# Patient Record
Sex: Female | Born: 1965 | Race: White | Hispanic: No | Marital: Single | Smoking: Never smoker
Health system: Southern US, Community
[De-identification: ages and names within clinical notes are randomized; demographics above are authoritative.]

## PROBLEM LIST (undated history)

## (undated) DIAGNOSIS — Z9221 Personal history of antineoplastic chemotherapy: Secondary | ICD-10-CM

## (undated) DIAGNOSIS — Z923 Personal history of irradiation: Secondary | ICD-10-CM

## (undated) DIAGNOSIS — C801 Malignant (primary) neoplasm, unspecified: Secondary | ICD-10-CM

## (undated) HISTORY — PX: BREAST BIOPSY: SHX20

## (undated) HISTORY — PX: BREAST LUMPECTOMY: SHX2

## (undated) HISTORY — PX: TUBAL LIGATION: SHX77

---

## 2018-11-11 DIAGNOSIS — M79673 Pain in unspecified foot: Secondary | ICD-10-CM | POA: Diagnosis not present

## 2018-11-11 DIAGNOSIS — R609 Edema, unspecified: Secondary | ICD-10-CM | POA: Diagnosis not present

## 2018-11-17 DIAGNOSIS — N925 Other specified irregular menstruation: Secondary | ICD-10-CM | POA: Diagnosis not present

## 2018-11-17 DIAGNOSIS — R5383 Other fatigue: Secondary | ICD-10-CM | POA: Diagnosis not present

## 2018-11-17 DIAGNOSIS — R600 Localized edema: Secondary | ICD-10-CM | POA: Diagnosis not present

## 2018-11-18 DIAGNOSIS — R740 Nonspecific elevation of levels of transaminase and lactic acid dehydrogenase [LDH]: Secondary | ICD-10-CM | POA: Diagnosis not present

## 2018-12-03 DIAGNOSIS — N925 Other specified irregular menstruation: Secondary | ICD-10-CM | POA: Diagnosis not present

## 2018-12-03 DIAGNOSIS — D259 Leiomyoma of uterus, unspecified: Secondary | ICD-10-CM | POA: Diagnosis not present

## 2018-12-31 DIAGNOSIS — D508 Other iron deficiency anemias: Secondary | ICD-10-CM | POA: Diagnosis not present

## 2018-12-31 DIAGNOSIS — R799 Abnormal finding of blood chemistry, unspecified: Secondary | ICD-10-CM | POA: Diagnosis not present

## 2019-01-10 DIAGNOSIS — N921 Excessive and frequent menstruation with irregular cycle: Secondary | ICD-10-CM | POA: Diagnosis not present

## 2019-01-10 DIAGNOSIS — D259 Leiomyoma of uterus, unspecified: Secondary | ICD-10-CM | POA: Diagnosis not present

## 2019-01-10 DIAGNOSIS — R9389 Abnormal findings on diagnostic imaging of other specified body structures: Secondary | ICD-10-CM | POA: Diagnosis not present

## 2019-01-12 ENCOUNTER — Ambulatory Visit (INDEPENDENT_AMBULATORY_CARE_PROVIDER_SITE_OTHER): Payer: BC Managed Care – PPO

## 2019-01-12 ENCOUNTER — Other Ambulatory Visit: Payer: Self-pay

## 2019-01-12 ENCOUNTER — Telehealth: Payer: Self-pay | Admitting: *Deleted

## 2019-01-12 ENCOUNTER — Ambulatory Visit (INDEPENDENT_AMBULATORY_CARE_PROVIDER_SITE_OTHER): Payer: BC Managed Care – PPO | Admitting: Sports Medicine

## 2019-01-12 ENCOUNTER — Other Ambulatory Visit: Payer: Self-pay | Admitting: Sports Medicine

## 2019-01-12 ENCOUNTER — Encounter: Payer: Self-pay | Admitting: Sports Medicine

## 2019-01-12 DIAGNOSIS — R609 Edema, unspecified: Secondary | ICD-10-CM

## 2019-01-12 DIAGNOSIS — M25572 Pain in left ankle and joints of left foot: Secondary | ICD-10-CM | POA: Diagnosis not present

## 2019-01-12 DIAGNOSIS — R2242 Localized swelling, mass and lump, left lower limb: Secondary | ICD-10-CM

## 2019-01-12 DIAGNOSIS — S96912A Strain of unspecified muscle and tendon at ankle and foot level, left foot, initial encounter: Secondary | ICD-10-CM | POA: Diagnosis not present

## 2019-01-12 NOTE — Telephone Encounter (Signed)
-----   Message from [place], [place] sent at 01/12/2019  2:59 PM EDT ----- Regarding: Venous Duplex R/o Blood Clot Swelling in left leg r/o blood clot. Please get it done this week. Shes on [employer]. Cannon Kettle

## 2019-01-12 NOTE — Progress Notes (Signed)
Subjective:  Elizabeth Sharp is a 53 y.o. female patient who presents to office for evaluation of left foot/ankle pain. 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 Patient denies any other pedal complaints. Denies injury/trip/fall/sprain/any causative factors.   Review of Systems  Cardiovascular: Positive for leg swelling.  Musculoskeletal: Positive for joint pain and myalgias.     There are no active problems to display for this patient.   No current outpatient medications on file prior to visit.   No current facility-administered medications on file prior to visit.     Not on File  Objective:  General: Alert and oriented x3 in no acute distress  Dermatology: No open lesions bilateral lower extremities, no webspace macerations, no ecchymosis bilateral, all nails x 10 are well manicured.  Vascular: Dorsalis Pedis and Posterior Tibial pedal pulses faintly palpable, Capillary Fill Time 3 seconds,(+) pedal hair growth bilateral, +1 Pitting edema to knee on left, Temperature gradient within normal limits.  Neurology: Johney Maine sensation intact via  light touch bilateral, Numbness when in shoe due to swelling  Musculoskeletal: Moderate tenderness with palpation at posterior left ankle and to calf muscle. No pain to foot on left. No pain with calf compression bilateral. Range of motion within normal limits with mild guarding on Left ankle. Negative thompson sign.   Gait: Antalgic gait  Xrays  Left foot/ Ankle   Impression:No acute findings.   Assessment and Plan: Problem List Items Addressed This Visit    None    Visit Diagnoses    Localized swelling of left lower leg    -  Primary   Tendon tear, ankle, left, initial encounter       Joint pain of ankle and foot, left          -Complete examination performed -Xrays reviewed -Discussed treatement options for possible long standing DVT vs Tendon tear -Rx Venous duplex r/o DVT and patient has started on 325mg  ASA as protective factors -Applied unna boot and instructed to keep in place until she can get her Duplex -Dispensed CAM boot and advised patient to use daily with toe guard if needed for work; Advised patient if her job does not allow her to use this boot she will have to be out of work until her tests are done -Advised patient if Duplex is negative will proceed with ordering MRI to r/o tear -Patient to return to office after testing or sooner if condition worsens.  Landis Martins, DPM

## 2019-01-13 ENCOUNTER — Other Ambulatory Visit: Payer: Self-pay | Admitting: Sports Medicine

## 2019-01-13 ENCOUNTER — Telehealth: Payer: Self-pay | Admitting: Sports Medicine

## 2019-01-13 ENCOUNTER — Encounter: Payer: Self-pay | Admitting: Sports Medicine

## 2019-01-13 DIAGNOSIS — M25572 Pain in left ankle and joints of left foot: Secondary | ICD-10-CM | POA: Diagnosis not present

## 2019-01-13 DIAGNOSIS — M7989 Other specified soft tissue disorders: Secondary | ICD-10-CM | POA: Diagnosis not present

## 2019-01-13 DIAGNOSIS — R2242 Localized swelling, mass and lump, left lower limb: Secondary | ICD-10-CM

## 2019-01-13 DIAGNOSIS — R609 Edema, unspecified: Secondary | ICD-10-CM

## 2019-01-13 DIAGNOSIS — S96912A Strain of unspecified muscle and tendon at ankle and foot level, left foot, initial encounter: Secondary | ICD-10-CM

## 2019-01-13 NOTE — Telephone Encounter (Signed)
[facility] Kathrin Ruddy scheduled Venous Doppler [id number] for 01/13/2019 arrive 1:00pm for 1:30pm testing. Faxed orders to [facility].

## 2019-01-13 NOTE — Telephone Encounter (Signed)
Thank you :)

## 2019-01-13 NOTE — Telephone Encounter (Signed)
I informed pt of [facility] 01/13/2019 appt.

## 2019-01-13 NOTE — Telephone Encounter (Signed)
Will you let patient know that her duplex is negative for DVT and proceed with ordering MRI w/w/o contrast to r/o tear at her achilles on the left Thanks Dr. Chauncey Cruel

## 2019-01-14 NOTE — Telephone Encounter (Signed)
Yes w/ and w/o thanks -Dr Chauncey Cruel

## 2019-01-14 NOTE — Telephone Encounter (Signed)
I informed pt of the results of the doppler and that we would be pre-certing the MRI prior to scheduling.

## 2019-01-17 NOTE — Telephone Encounter (Signed)
Orders to J. Quintana, RN for pre-cert. 

## 2019-01-17 NOTE — Addendum Note (Signed)
Addended by: Harriett Sine D on: 01/17/2019 09:26 AM   Modules accepted: Orders

## 2019-01-19 ENCOUNTER — Other Ambulatory Visit: Payer: Self-pay | Admitting: Sports Medicine

## 2019-01-19 ENCOUNTER — Telehealth: Payer: Self-pay | Admitting: *Deleted

## 2019-01-19 DIAGNOSIS — M25572 Pain in left ankle and joints of left foot: Secondary | ICD-10-CM

## 2019-01-19 MED ORDER — TRAMADOL HCL 50 MG PO TABS: 50.0000 mg | ORAL_TABLET | Freq: Three times a day (TID) | ORAL | 0 refills | Status: AC | PRN

## 2019-01-19 NOTE — Telephone Encounter (Signed)
Patient is calling again wanting to know the status of her MRI.  Does not want to hear that it is still in precert.  States it has been a week and she is in pain and needs something done now.  Patient is demanding that we contact the insurance and tell them it must be rushed and she should not have to be in pain this long.  Patient sates that she should at least be on something to help the pain and inflammation.   Ladies please advise as soon as possible.

## 2019-01-19 NOTE — Telephone Encounter (Signed)
I have sent to pharmacy Tramadol for the pain and she can take OTC motrin as needed for any additional pain relief and have recommended her to rest ice and elevate and limit her activities until we can get MRI Please follow up with the patient regarding the status of her MRI. Thanks Dr. Cannon Kettle

## 2019-01-19 NOTE — Progress Notes (Signed)
Sent Tramadol for pain and if needed patient can supplement with OTC motrin for any additional pain and inflammation until her MRI can be done. Patient to continue with rest, ice ,elevation until symptoms improve.

## 2019-01-19 NOTE — Telephone Encounter (Signed)
[facility] Kathrin Ruddy scheduled MRI [id number] left ankle with and without on 01/24/2019 arrive 8:00am for 8:15am imaging. Faxed orders to [facility].

## 2019-01-19 NOTE — Telephone Encounter (Signed)
I called pt and informed of Dr. Leeanne Rio orders, pt states understanding and said she called [facility] and they got her an appt tomorrow. Pt thanked me for giving her their number to call for a cancellation appt slot.

## 2019-01-19 NOTE — Telephone Encounter (Signed)
I informed pt of [facility] appt 01/24/2019 and the [phone] to call for possible cancellation appt slots earlier than 01/24/2019.

## 2019-01-20 ENCOUNTER — Encounter: Payer: Self-pay | Admitting: Sports Medicine

## 2019-01-20 DIAGNOSIS — M19072 Primary osteoarthritis, left ankle and foot: Secondary | ICD-10-CM | POA: Diagnosis not present

## 2019-01-20 DIAGNOSIS — S96912A Strain of unspecified muscle and tendon at ankle and foot level, left foot, initial encounter: Secondary | ICD-10-CM | POA: Diagnosis not present

## 2019-01-20 DIAGNOSIS — M65879 Other synovitis and tenosynovitis, unspecified ankle and foot: Secondary | ICD-10-CM | POA: Diagnosis not present

## 2019-01-21 ENCOUNTER — Telehealth: Payer: Self-pay | Admitting: *Deleted

## 2019-01-21 ENCOUNTER — Telehealth: Payer: Self-pay | Admitting: Sports Medicine

## 2019-01-21 MED ORDER — PREDNISONE 10 MG (21) PO TBPK: ORAL_TABLET | ORAL | 0 refills | Status: DC

## 2019-01-21 NOTE — Telephone Encounter (Signed)
Pt states the prednisone prescription does not have instruction just came in a box. I informed pt the instructions would be on the pill card in the box and to begin on a full day. Pt states understanding.

## 2019-01-21 NOTE — Telephone Encounter (Signed)
Patient called and confirmed she received the message left this morning.  She will pick up the steroid does pack and scheduled a follow up appointment for Next Friday 01/28/2019

## 2019-01-21 NOTE — Telephone Encounter (Signed)
FYI: I Called patient to discuss MRI results.  There was no answer.  I left a voicemail for patient informing her that her MRI showed significant tendinitis and edema.  There was no significant tearing upon further review of her MRI report as well as the images the area of suspected tear was actually the area of and the accessory muscle at this time there is no acute surgical needs recommend patient to have and start taking a steroid Dosepak and will follow up with her in office on next week.  The steroid Dosepak has already been sent to her pharmacy to take as directed. Patient to call office to set up follow up appointment. -Dr. Cannon Kettle

## 2019-01-25 ENCOUNTER — Telehealth: Payer: Self-pay | Admitting: *Deleted

## 2019-01-25 NOTE — Telephone Encounter (Signed)
Pt states she just missed a call from this office.

## 2019-01-28 ENCOUNTER — Other Ambulatory Visit: Payer: Self-pay

## 2019-01-28 ENCOUNTER — Ambulatory Visit (INDEPENDENT_AMBULATORY_CARE_PROVIDER_SITE_OTHER): Payer: BC Managed Care – PPO | Admitting: Sports Medicine

## 2019-01-28 ENCOUNTER — Encounter: Payer: Self-pay | Admitting: Sports Medicine

## 2019-01-28 VITALS — Temp 97.0°F | Resp 16

## 2019-01-28 DIAGNOSIS — M779 Enthesopathy, unspecified: Secondary | ICD-10-CM

## 2019-01-28 DIAGNOSIS — M25572 Pain in left ankle and joints of left foot: Secondary | ICD-10-CM

## 2019-01-28 DIAGNOSIS — R609 Edema, unspecified: Secondary | ICD-10-CM

## 2019-01-28 DIAGNOSIS — I739 Peripheral vascular disease, unspecified: Secondary | ICD-10-CM

## 2019-01-28 NOTE — Progress Notes (Signed)
Subjective:  Elizabeth Sharp is a 53 y.o. female patient who presents to office for evaluation of left foot/ankle pain. Patient reports that there is no pain that she has been doing very well only concern is swelling that is still present in her left foot and lower leg.  Patient reports that she has been wearing boot at work because it feels supported and icing patient has completed steroid by mouth without any issues.  Patient is also here for review of MRI results and further follow-up discussion.   There are no active problems to display for this patient.   Current Outpatient Medications on File Prior to Visit  Medication Sig Dispense Refill  . Iron-FA-B Cmp-C-Biot-Probiotic (FUSION PLUS) CAPS     . predniSONE (STERAPRED UNI-PAK 21 TAB) 10 MG (21) TBPK tablet Take as directed 21 tablet 0   No current facility-administered medications on file prior to visit.     Not on File  Objective:  General: Alert and oriented x3 in no acute distress  Dermatology: No open lesions bilateral lower extremities, no webspace macerations, no ecchymosis bilateral, all nails x 10 are well manicured.  Vascular: Dorsalis Pedis and Posterior Tibial pedal pulses faintly palpable, Capillary Fill Time 3 seconds,(+) pedal hair growth bilateral, +1 Pitting edema to knee on left, Temperature gradient within normal limits.  Neurology: Johney Maine sensation intact via light touch bilateral, Numbness when in shoe due to swelling  Musculoskeletal: No reproducible tenderness to left foot and lower calf upon palpation.  Thompson sign negative.  No acute signs of DVT.  Patient had an ultrasound that also confirmed no DVT.  MRI supportive with tendinitis without tear.  Assessment and Plan: Problem List Items Addressed This Visit    None    Visit Diagnoses    Tendinitis    -  Primary   Joint pain of ankle and foot, left       Swelling       PVD (peripheral vascular disease) (HCC)         -Complete examination  performed -Advised patient that she is doing better may slowly transition after 5 days out of the cam boot to a normal shoe -Applied Unna boot to the level of the knee on the left foot and lower leg advised patient to keep clean dry and intact for 5 days after 5 days may remove and apply compression sleeve as provided at today's visit advised patient that long-term she definitely needs compression stockings and if swelling continues may benefit from seeing a vascular doctor to help keep the edema under control since she has a lot of spider veins -Patient to return to office as needed or sooner if condition worsens.  Landis Martins, DPM

## 2019-02-10 DIAGNOSIS — R2242 Localized swelling, mass and lump, left lower limb: Secondary | ICD-10-CM | POA: Diagnosis not present

## 2019-02-11 DIAGNOSIS — R2242 Localized swelling, mass and lump, left lower limb: Secondary | ICD-10-CM | POA: Insufficient documentation

## 2019-02-18 DIAGNOSIS — I8311 Varicose veins of right lower extremity with inflammation: Secondary | ICD-10-CM | POA: Diagnosis not present

## 2019-02-18 DIAGNOSIS — R2242 Localized swelling, mass and lump, left lower limb: Secondary | ICD-10-CM | POA: Diagnosis not present

## 2019-02-18 DIAGNOSIS — I8312 Varicose veins of left lower extremity with inflammation: Secondary | ICD-10-CM | POA: Diagnosis not present

## 2019-04-11 DIAGNOSIS — N6342 Unspecified lump in left breast, subareolar: Secondary | ICD-10-CM | POA: Diagnosis not present

## 2019-04-12 ENCOUNTER — Other Ambulatory Visit: Payer: Self-pay | Admitting: Medical

## 2019-04-12 DIAGNOSIS — N6320 Unspecified lump in the left breast, unspecified quadrant: Secondary | ICD-10-CM

## 2019-04-21 ENCOUNTER — Ambulatory Visit
Admission: RE | Admit: 2019-04-21 | Discharge: 2019-04-21 | Disposition: A | Discharge status: Home / Self Care | Payer: BC Managed Care – PPO | Source: Ambulatory Visit | Attending: Medical | Admitting: Medical

## 2019-04-21 ENCOUNTER — Other Ambulatory Visit: Payer: Self-pay | Admitting: Medical

## 2019-04-21 ENCOUNTER — Other Ambulatory Visit: Payer: Self-pay

## 2019-04-21 DIAGNOSIS — R921 Mammographic calcification found on diagnostic imaging of breast: Secondary | ICD-10-CM

## 2019-04-21 DIAGNOSIS — N6320 Unspecified lump in the left breast, unspecified quadrant: Secondary | ICD-10-CM

## 2019-04-21 DIAGNOSIS — N6322 Unspecified lump in the left breast, upper inner quadrant: Secondary | ICD-10-CM | POA: Diagnosis not present

## 2019-04-21 DIAGNOSIS — R922 Inconclusive mammogram: Secondary | ICD-10-CM | POA: Diagnosis not present

## 2019-04-21 DIAGNOSIS — N6489 Other specified disorders of breast: Secondary | ICD-10-CM | POA: Diagnosis not present

## 2019-04-28 ENCOUNTER — Ambulatory Visit
Admission: RE | Admit: 2019-04-28 | Discharge: 2019-04-28 | Disposition: A | Discharge status: Home / Self Care | Payer: BC Managed Care – PPO | Source: Ambulatory Visit | Attending: Medical | Admitting: Medical

## 2019-04-28 ENCOUNTER — Other Ambulatory Visit: Payer: Self-pay

## 2019-04-28 ENCOUNTER — Other Ambulatory Visit: Payer: Self-pay | Admitting: Medical

## 2019-04-28 DIAGNOSIS — N6320 Unspecified lump in the left breast, unspecified quadrant: Secondary | ICD-10-CM

## 2019-04-28 DIAGNOSIS — R590 Localized enlarged lymph nodes: Secondary | ICD-10-CM | POA: Diagnosis not present

## 2019-04-28 DIAGNOSIS — R921 Mammographic calcification found on diagnostic imaging of breast: Secondary | ICD-10-CM | POA: Diagnosis not present

## 2019-04-28 DIAGNOSIS — N6011 Diffuse cystic mastopathy of right breast: Secondary | ICD-10-CM | POA: Diagnosis not present

## 2019-04-28 DIAGNOSIS — N6322 Unspecified lump in the left breast, upper inner quadrant: Secondary | ICD-10-CM | POA: Diagnosis not present

## 2019-04-28 DIAGNOSIS — C773 Secondary and unspecified malignant neoplasm of axilla and upper limb lymph nodes: Secondary | ICD-10-CM | POA: Diagnosis not present

## 2019-04-28 DIAGNOSIS — C50212 Malignant neoplasm of upper-inner quadrant of left female breast: Secondary | ICD-10-CM | POA: Diagnosis not present

## 2019-05-09 ENCOUNTER — Telehealth: Payer: Self-pay | Admitting: Hematology and Oncology

## 2019-05-09 ENCOUNTER — Other Ambulatory Visit: Payer: Self-pay | Admitting: General Surgery

## 2019-05-09 DIAGNOSIS — M7989 Other specified soft tissue disorders: Secondary | ICD-10-CM | POA: Diagnosis not present

## 2019-05-09 DIAGNOSIS — C50812 Malignant neoplasm of overlapping sites of left female breast: Secondary | ICD-10-CM | POA: Diagnosis not present

## 2019-05-09 DIAGNOSIS — R110 Nausea: Secondary | ICD-10-CM | POA: Diagnosis not present

## 2019-05-09 NOTE — Progress Notes (Signed)
[facility] CONSULT NOTE  Patient Care Team: Darrol Jump, PA-C as PCP - General (Family Medicine)  CHIEF COMPLAINTS/PURPOSE OF CONSULTATION:  Newly diagnosed breast cancer  HISTORY OF PRESENTING ILLNESS:  Elizabeth Sharp 53 y.o. female is here because of recent diagnosis of invasive ductal carcinoma of the left breast. The patient palpated a left breast lump. Mammogram on 04/21/19 showed a 4.5cm mass at the 11:30 position in the left breast with smaller adjacent solid masses and one abnormal left axillary lymph node. 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 She presents to the clinic today for initial evaluation and discussion of treatment options.   I reviewed her records extensively and collaborated the history with the patient.  SUMMARY OF ONCOLOGIC HISTORY: Oncology History  Malignant neoplasm of upper-inner quadrant of left breast in female, estrogen receptor positive ([place])  04/28/2019 Initial Diagnosis   Palpable left breast lump.  Mammogram 04/21/19 showed a 4.5cm mass at the 11:30 position with smaller adjacent solid masses and one abnormal left axillary lymph node. 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   05/10/2019 Cancer Staging   Staging form: Breast, AJCC 8th Edition - Clinical stage from 05/10/2019: Stage IIB (cT2, cN1, cM0, G3, ER+, PR+, HER2-) - Signed by Nicholas Lose, MD on 05/10/2019     MEDICAL HISTORY:  Left leg swelling with no clear etiology.  Patient is seen for physicians for this.  Extensive work-up has been done. Iron deficiency anemia SURGICAL HISTORY: Tubal ligation SOCIAL HISTORY: Denies any tobacco alcohol or recreational drug use FAMILY HISTORY: Aunt with breast cancer ALLERGIES:  has no allergies on file.  MEDICATIONS:  Current  Outpatient Medications  Medication Sig Dispense Refill  . Iron-FA-B Cmp-C-Biot-Probiotic (FUSION PLUS) CAPS     . predniSONE (STERAPRED UNI-PAK 21 TAB) 10 MG (21) TBPK tablet Take as directed 21 tablet 0   No current facility-administered medications for this visit.     REVIEW OF SYSTEMS:   Constitutional: Denies fevers, chills or abnormal night sweats Eyes: Denies blurriness of vision, double vision or watery eyes Ears, nose, mouth, throat, and face: Denies mucositis or sore throat Respiratory: Denies cough, dyspnea or wheezes Cardiovascular: Denies palpitation, chest discomfort or lower extremity swelling Gastrointestinal:  Denies nausea, heartburn or change in bowel habits Skin: Denies abnormal skin rashes Lymphatics: Denies new lymphadenopathy or easy bruising Neurological:Denies numbness, tingling or new weaknesses Behavioral/Psych: Mood is stable, no new changes  Breast: Palpable left breast lump  All other systems were reviewed with the patient and are negative.  PHYSICAL EXAMINATION: ECOG PERFORMANCE STATUS: 1 - Symptomatic but completely ambulatory  Vitals:   05/10/19 1303  BP: (!) 155/92  Pulse: 90  Resp: 20  Temp: 98 F (36.7 C)  SpO2: 99%   Filed Weights   05/10/19 1303  Weight: 206 lb 3.2 oz (93.5 kg)    GENERAL:alert, no distress and comfortable SKIN: skin color, texture, turgor are normal, no rashes or significant lesions EYES: normal, conjunctiva are pink and non-injected, sclera clear OROPHARYNX:no exudate, no erythema and lips, buccal mucosa, and tongue normal  NECK: supple, thyroid normal size, non-tender, without nodularity LYMPH:  no palpable lymphadenopathy in the cervical, axillary or inguinal LUNGS: clear to auscultation and percussion with normal breathing effort HEART: regular rate & rhythm and no murmurs and  no lower extremity edema ABDOMEN:abdomen soft, non-tender and normal bowel sounds Musculoskeletal:no cyanosis of digits and no clubbing   PSYCH: alert & oriented x 3 with fluent speech NEURO: no focal motor/sensory deficits BREAST: Palpable left breast mass.  No palpable axillary or supraclavicular lymphadenopathy (exam performed in the presence of a chaperone)   LABORATORY DATA:    RADIOGRAPHIC STUDIES: I have personally reviewed the radiological reports and agreed with the findings in the report.  ASSESSMENT AND PLAN:  Malignant neoplasm of upper-inner quadrant of left breast in female, estrogen receptor positive ([place]) 04/28/2019: Palpable left breast lump, mammogram 4.5 cm mass at 11:30 position and adjacent smaller masses, 1 abnormal left axillary lymph node: Biopsy of breast and axilla both positive for grade 3 IDC ER 95%, PR 90%, Ki-67 70%, HER-2 negative T2N1 stage IIb clinical stage  Pathology and radiology counseling: Discussed with the patient, the details of pathology including the type of breast cancer,the clinical staging, the significance of ER, PR and HER-2/neu receptors and the implications for treatment. After reviewing the pathology in detail, we proceeded to discuss the different treatment options between surgery, radiation, chemotherapy, antiestrogen therapies.  Recommendation: 1. Neoadjuvant chemotherapy with Taxotere and Cytoxan every 3 weeks x4 2.  Breast conserving surgery with targeted node dissection 3.  Adjuvant radiation therapy 4.  Followed by adjuvant antiestrogen therapy  Chemotherapy Counseling: I discussed the risks and benefits of chemotherapy including the risks of nausea/ vomiting, risk of infection from low WBC count, fatigue due to chemo or anemia, bruising or bleeding due to low platelets, mouth sores, loss/ change in taste and decreased appetite. Liver and kidney function will be monitored through out chemotherapy as abnormalities in liver and kidney function may be a side effect of treatment.  Neuropathy risk.  Taxotere was also discussed.  Risk of permanent bone marrow dysfunction and  leukemia due to chemo were also discussed.  Return to clinic on 05/20/2019 to start chemotherapy.    All questions were answered. The patient knows to call the clinic with any problems, questions or concerns.   Rulon Eisenmenger, MD, MPH 05/10/2019    I, Molly Dorshimer, am acting as scribe for Nicholas Lose, MD.  I have reviewed the above documentation for accuracy and completeness, and I agree with the above.

## 2019-05-09 NOTE — Telephone Encounter (Signed)
Received a new patient referral from Dr. Barry Dienes for nex dx of breast cancer. Elizabeth Sharp has been cld and scheduled to see Dr. Lindi Adie on 12/1 at 1pm. She's been made aware to arrive 15 minutes early.

## 2019-05-10 ENCOUNTER — Other Ambulatory Visit: Payer: Self-pay | Admitting: *Deleted

## 2019-05-10 ENCOUNTER — Inpatient Hospital Stay: Payer: BC Managed Care – PPO | Attending: Hematology and Oncology | Admitting: Hematology and Oncology

## 2019-05-10 ENCOUNTER — Other Ambulatory Visit: Payer: Self-pay

## 2019-05-10 DIAGNOSIS — Z79899 Other long term (current) drug therapy: Secondary | ICD-10-CM | POA: Insufficient documentation

## 2019-05-10 DIAGNOSIS — Z803 Family history of malignant neoplasm of breast: Secondary | ICD-10-CM | POA: Diagnosis not present

## 2019-05-10 DIAGNOSIS — M2550 Pain in unspecified joint: Secondary | ICD-10-CM | POA: Diagnosis not present

## 2019-05-10 DIAGNOSIS — R7989 Other specified abnormal findings of blood chemistry: Secondary | ICD-10-CM | POA: Diagnosis not present

## 2019-05-10 DIAGNOSIS — Z5111 Encounter for antineoplastic chemotherapy: Secondary | ICD-10-CM | POA: Insufficient documentation

## 2019-05-10 DIAGNOSIS — R222 Localized swelling, mass and lump, trunk: Secondary | ICD-10-CM | POA: Diagnosis not present

## 2019-05-10 DIAGNOSIS — Z170 Estrogen receptor positive status [ER+]: Secondary | ICD-10-CM

## 2019-05-10 DIAGNOSIS — R438 Other disturbances of smell and taste: Secondary | ICD-10-CM | POA: Insufficient documentation

## 2019-05-10 DIAGNOSIS — D6481 Anemia due to antineoplastic chemotherapy: Secondary | ICD-10-CM | POA: Insufficient documentation

## 2019-05-10 DIAGNOSIS — Z5189 Encounter for other specified aftercare: Secondary | ICD-10-CM | POA: Insufficient documentation

## 2019-05-10 DIAGNOSIS — D259 Leiomyoma of uterus, unspecified: Secondary | ICD-10-CM | POA: Insufficient documentation

## 2019-05-10 DIAGNOSIS — L659 Nonscarring hair loss, unspecified: Secondary | ICD-10-CM | POA: Diagnosis not present

## 2019-05-10 DIAGNOSIS — C50212 Malignant neoplasm of upper-inner quadrant of left female breast: Secondary | ICD-10-CM | POA: Insufficient documentation

## 2019-05-10 DIAGNOSIS — E611 Iron deficiency: Secondary | ICD-10-CM | POA: Insufficient documentation

## 2019-05-10 DIAGNOSIS — D509 Iron deficiency anemia, unspecified: Secondary | ICD-10-CM | POA: Diagnosis not present

## 2019-05-10 DIAGNOSIS — M7989 Other specified soft tissue disorders: Secondary | ICD-10-CM | POA: Insufficient documentation

## 2019-05-10 DIAGNOSIS — R5383 Other fatigue: Secondary | ICD-10-CM | POA: Insufficient documentation

## 2019-05-10 DIAGNOSIS — T451X5A Adverse effect of antineoplastic and immunosuppressive drugs, initial encounter: Secondary | ICD-10-CM | POA: Insufficient documentation

## 2019-05-10 NOTE — Assessment & Plan Note (Addendum)
04/28/2019: Palpable left breast lump, mammogram 4.5 cm mass at 11:30 position and adjacent smaller masses, 1 abnormal left axillary lymph node: Biopsy of breast and axilla both positive for grade 3 IDC ER 95%, PR 90%, Ki-67 70%, HER-2 negative T2N1 stage IIb clinical stage  Pathology and radiology counseling: Discussed with the patient, the details of pathology including the type of breast cancer,the clinical staging, the significance of ER, PR and HER-2/neu receptors and the implications for treatment. After reviewing the pathology in detail, we proceeded to discuss the different treatment options between surgery, radiation, chemotherapy, antiestrogen therapies.  Recommendation: 1. Neoadjuvant chemotherapy with Taxotere and Cytoxan every 3 weeks x4 2.  Breast conserving surgery with targeted node dissection 3.  Adjuvant radiation therapy 4.  Followed by adjuvant antiestrogen therapy  Chemotherapy Counseling: I discussed the risks and benefits of chemotherapy including the risks of nausea/ vomiting, risk of infection from low WBC count, fatigue due to chemo or anemia, bruising or bleeding due to low platelets, mouth sores, loss/ change in taste and decreased appetite. Liver and kidney function will be monitored through out chemotherapy as abnormalities in liver and kidney function may be a side effect of treatment.  Neuropathy risk.  Taxotere was also discussed.  Risk of permanent bone marrow dysfunction and leukemia due to chemo were also discussed.  Return to clinic on 05/20/2019 to start chemotherapy.

## 2019-05-11 ENCOUNTER — Telehealth: Payer: Self-pay | Admitting: Hematology and Oncology

## 2019-05-11 ENCOUNTER — Telehealth: Payer: Self-pay | Admitting: *Deleted

## 2019-05-11 NOTE — Telephone Encounter (Signed)
I left a message regarding schedule  

## 2019-05-11 NOTE — Telephone Encounter (Signed)
Spoke to pt gave navigation resources and contact information. Verified future appts. Denies further questions at this time

## 2019-05-12 ENCOUNTER — Other Ambulatory Visit: Payer: Self-pay | Admitting: *Deleted

## 2019-05-12 ENCOUNTER — Other Ambulatory Visit: Payer: Self-pay | Admitting: Hematology and Oncology

## 2019-05-12 DIAGNOSIS — Z170 Estrogen receptor positive status [ER+]: Secondary | ICD-10-CM

## 2019-05-12 DIAGNOSIS — C50212 Malignant neoplasm of upper-inner quadrant of left female breast: Secondary | ICD-10-CM

## 2019-05-12 MED ORDER — ONDANSETRON HCL 8 MG PO TABS: 8.0000 mg | ORAL_TABLET | Freq: Two times a day (BID) | ORAL | 1 refills | Status: DC | PRN

## 2019-05-12 MED ORDER — PROCHLORPERAZINE MALEATE 10 MG PO TABS: 10.0000 mg | ORAL_TABLET | Freq: Four times a day (QID) | ORAL | 1 refills | Status: DC | PRN

## 2019-05-12 MED ORDER — DEXAMETHASONE 4 MG PO TABS: 4.0000 mg | ORAL_TABLET | Freq: Every day | ORAL | 0 refills | Status: DC

## 2019-05-12 MED ORDER — LIDOCAINE-PRILOCAINE 2.5-2.5 % EX CREA: TOPICAL_CREAM | CUTANEOUS | 3 refills | Status: DC

## 2019-05-12 MED ORDER — LORAZEPAM 0.5 MG PO TABS: 0.5000 mg | ORAL_TABLET | Freq: Every evening | ORAL | 0 refills | Status: DC | PRN

## 2019-05-12 NOTE — Progress Notes (Signed)
START ON PATHWAY REGIMEN - Breast     A cycle is every 21 days:     Docetaxel      Cyclophosphamide   **Always confirm dose/schedule in your pharmacy ordering system**    Patient Characteristics: Preoperative or Nonsurgical Candidate (Clinical Staging), Neoadjuvant Therapy followed by Surgery, Invasive Disease, Chemotherapy, HER2 Negative/Unknown/Equivocal, ER Positive Therapeutic Status: Preoperative or Nonsurgical Candidate (Clinical Staging) AJCC M Category: cM0 AJCC Grade: G3 Breast Surgical Plan: Neoadjuvant Therapy followed by Surgery ER Status: Positive (+) AJCC 8 Stage Grouping: IIB HER2 Status: Negative (-) AJCC T Category: cT2 AJCC N Category: cN1 PR Status: Positive (+)  Intent of Therapy: Curative Intent, Discussed with Patient 

## 2019-05-13 ENCOUNTER — Encounter (HOSPITAL_COMMUNITY): Payer: Self-pay

## 2019-05-13 ENCOUNTER — Other Ambulatory Visit: Payer: Self-pay | Admitting: General Surgery

## 2019-05-13 DIAGNOSIS — R638 Other symptoms and signs concerning food and fluid intake: Secondary | ICD-10-CM

## 2019-05-13 DIAGNOSIS — R110 Nausea: Secondary | ICD-10-CM

## 2019-05-13 DIAGNOSIS — M7989 Other specified soft tissue disorders: Secondary | ICD-10-CM

## 2019-05-13 NOTE — Progress Notes (Signed)
[id number] [address], [place] [address] Phone: [phone] Fax: [phone]      Your procedure is scheduled on Tuesday, December 8th, 2020.   Report to [facility] Main Entrance "A" at 10:30 A.M., and check in at the Admitting office.   Call this number if you have problems the morning of surgery:  [phone]  Call [phone] if you have any questions prior to your surgery date Monday-Friday 8am-4pm   Remember:  Do not eat or drink after midnight the night before your surgery   Take these medicines the morning of surgery with A SIP OF WATER :  Dexamethasone (Decadron) Ondansetron (Zofran) - if needed Prochlorperazine (Compazine) - if needed  7 days prior to surgery STOP taking any Aspirin (unless otherwise instructed by your surgeon), Aleve, Naproxen, Ibuprofen, Motrin, Advil, Goody's, BC's, all herbal medications, fish oil, and all vitamins.    The Morning of Surgery  Do not wear jewelry, make-up or nail polish.  Do not wear lotions, powders, or perfumes/colognes, or deodorant  Do not shave 48 hours prior to surgery.  Men may shave face and neck.  Do not bring valuables to the hospital.  [facility] is not responsible for any belongings or valuables.  If you are a smoker, DO NOT Smoke 24 hours prior to surgery  If you wear a CPAP at night please bring your mask, tubing, and machine the morning of surgery   Remember that you must have someone to transport you home after your surgery, and remain with you for 24 hours if you are discharged the same day.   Please bring cases for contacts, glasses, hearing aids, dentures or bridgework because it cannot be worn into surgery.    Leave your suitcase in the car.  After surgery it may be brought to your room.  For patients admitted to the hospital, discharge time will be determined by your treatment team.  Patients discharged the day of surgery will not be  allowed to drive home.    Special instructions:   [facility]- Preparing For Surgery  Before surgery, you can play an important role. Because skin is not sterile, your skin needs to be as free of germs as possible. You can reduce the number of germs on your skin by washing with CHG (chlorahexidine gluconate) Soap before surgery.  CHG is an antiseptic cleaner which kills germs and bonds with the skin to continue killing germs even after washing.    Oral Hygiene is also important to reduce your risk of infection.  Remember - BRUSH YOUR TEETH THE MORNING OF SURGERY WITH YOUR REGULAR TOOTHPASTE  Please do not use if you have an allergy to CHG or antibacterial soaps. If your skin becomes reddened/irritated stop using the CHG.  Do not shave (including legs and underarms) for at least 48 hours prior to first CHG shower. It is OK to shave your face.  Please follow these instructions carefully.   1. Shower the NIGHT BEFORE SURGERY and the MORNING OF SURGERY with CHG Soap.   2. If you chose to wash your hair, wash your hair first as usual with your normal shampoo.  3. After you shampoo, rinse your hair and body thoroughly to remove the shampoo.  4. Use CHG as you would any other liquid soap. You can apply CHG directly to the skin and wash gently with a scrungie or a clean washcloth.   5. Apply the CHG Soap  to your body ONLY FROM THE NECK DOWN.  Do not use on open wounds or open sores. Avoid contact with your eyes, ears, mouth and genitals (private parts). Wash Face and genitals (private parts)  with your normal soap.   6. Wash thoroughly, paying special attention to the area where your surgery will be performed.  7. Thoroughly rinse your body with warm water from the neck down.  8. DO NOT shower/wash with your normal soap after using and rinsing off the CHG Soap.  9. Pat yourself dry with a CLEAN TOWEL.  10. Wear CLEAN PAJAMAS to bed the night before surgery, wear comfortable clothes the  morning of surgery  11. Place CLEAN SHEETS on your bed the night of your first shower and DO NOT SLEEP WITH PETS.    Day of Surgery:  Please shower the morning of surgery with the CHG soap Do not apply any deodorants/lotions. Please wear clean clothes to the hospital/surgery center.   Remember to brush your teeth WITH YOUR REGULAR TOOTHPASTE.   Please read over the following fact sheets that you were given.

## 2019-05-14 ENCOUNTER — Other Ambulatory Visit (HOSPITAL_COMMUNITY)
Admission: RE | Admit: 2019-05-14 | Discharge: 2019-05-14 | Disposition: A | Discharge status: Home / Self Care | Payer: BC Managed Care – PPO | Source: Ambulatory Visit | Attending: General Surgery | Admitting: General Surgery

## 2019-05-14 ENCOUNTER — Other Ambulatory Visit (HOSPITAL_COMMUNITY): Payer: BC Managed Care – PPO

## 2019-05-14 DIAGNOSIS — Z20828 Contact with and (suspected) exposure to other viral communicable diseases: Secondary | ICD-10-CM | POA: Insufficient documentation

## 2019-05-14 DIAGNOSIS — Z01812 Encounter for preprocedural laboratory examination: Secondary | ICD-10-CM | POA: Diagnosis not present

## 2019-05-14 LAB — SARS CORONAVIRUS 2 (TAT 6-24 HRS): SARS Coronavirus 2: NEGATIVE

## 2019-05-16 ENCOUNTER — Encounter (HOSPITAL_COMMUNITY)
Admission: RE | Admit: 2019-05-16 | Discharge: 2019-05-16 | Disposition: A | Discharge status: Home / Self Care | Payer: BC Managed Care – PPO | Source: Ambulatory Visit | Attending: General Surgery | Admitting: General Surgery

## 2019-05-16 ENCOUNTER — Other Ambulatory Visit: Payer: Self-pay

## 2019-05-16 ENCOUNTER — Encounter (HOSPITAL_COMMUNITY): Payer: Self-pay

## 2019-05-16 ENCOUNTER — Other Ambulatory Visit (HOSPITAL_COMMUNITY): Payer: BC Managed Care – PPO

## 2019-05-16 DIAGNOSIS — C50912 Malignant neoplasm of unspecified site of left female breast: Secondary | ICD-10-CM | POA: Diagnosis not present

## 2019-05-16 DIAGNOSIS — Z01812 Encounter for preprocedural laboratory examination: Secondary | ICD-10-CM | POA: Diagnosis not present

## 2019-05-16 HISTORY — DX: Malignant (primary) neoplasm, unspecified: C80.1

## 2019-05-16 LAB — CBC
HCT: 34.1 % — ABNORMAL LOW (ref 36.0–46.0)
Hemoglobin: 10.6 g/dL — ABNORMAL LOW (ref 12.0–15.0)
MCH: 29.4 pg (ref 26.0–34.0)
MCHC: 31.1 g/dL (ref 30.0–36.0)
MCV: 94.7 fL (ref 80.0–100.0)
Platelets: 328 10*3/uL (ref 150–400)
RBC: 3.6 MIL/uL — ABNORMAL LOW (ref 3.87–5.11)
RDW: 13.7 % (ref 11.5–15.5)
WBC: 4.5 10*3/uL (ref 4.0–10.5)
nRBC: 0 % (ref 0.0–0.2)

## 2019-05-16 LAB — BASIC METABOLIC PANEL
Anion gap: 12 (ref 5–15)
BUN: 8 mg/dL (ref 6–20)
CO2: 25 mmol/L (ref 22–32)
Calcium: 9 mg/dL (ref 8.9–10.3)
Chloride: 102 mmol/L (ref 98–111)
Creatinine, Ser: 0.71 mg/dL (ref 0.44–1.00)
GFR calc Af Amer: >60 mL/min (ref >60)
GFR calc non Af Amer: >60 mL/min (ref >60)
Glucose, Bld: 119 mg/dL — ABNORMAL HIGH (ref 70–99)
Potassium: 3.4 mmol/L — ABNORMAL LOW (ref 3.5–5.1)
Sodium: 139 mmol/L (ref 135–145)

## 2019-05-16 NOTE — Progress Notes (Signed)
PCP - Darrol Jump, PA-C Cardiologist - denies   Chest x-ray - N/A EKG - N/A Stress Test - denies  ECHO - denies Cardiac Cath - denies  Sleep Study - denies  Aspirin Instructions: Patient instructed to hold all Aspirin, NSAID's, herbal medications, fish oil and vitamins 7 days prior to surgery.   ERAS Protcol - n/a PRE-SURGERY Ensure or G2- not ordered  COVID TEST- 05/14/19- negative   Anesthesia review:   Patient denies shortness of breath, fever, cough and chest pain at PAT appointment   All instructions explained to the patient, with a verbal understanding of the material. Patient agrees to go over the instructions while at home for a better understanding. Patient also instructed to self quarantine after being tested for COVID-19. The opportunity to ask questions was provided.

## 2019-05-16 NOTE — H&P (Signed)
Elizabeth Sharp Documented: 05/09/2019 9:18 AM Location: [facility] Patient #: [id number] DOB: [birth year] Single / Language: [name] / Race: White Female   History of Present Illness Elizabeth Klein MD; 05/09/2019 10:35 AM) The patient is a 53 year old female who presents with breast cancer. Pt is a 53 yo F referred for consultation by Dr. Shelly Bombard for a new diagnosis of left breast cancer. She presented with a self detected left breast mass. It was present for around 6 weeks. She has not had mammogram for around 6 years. She had diagnostic imaging which showed a 4.5 cm mass around 11:30 with a few small satellite lesions. There was 1 abnormal lymph node as well. The right breast had an area of calcifications seen. 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 The right breast biopsy was benign and concordant.   She has no prior breast biopsies. She had a maternal aunt who had some type of cancer, but she is not sure what type. She had menarche at age 60, menopause in her upper 68s and is a G2P2. She has not had a colonoscopy.   dx mammogram/ultrasound 04/21/2019 COMPARISON: Previous exam(s).  ACR Breast Density Category c: The breast tissue is heterogeneously dense, which may obscure small masses.  FINDINGS: Cc and MLO views of bilateral breasts, spot tangential view of left breast, spot magnification CC and lateral views of the right breast are submitted. In the palpable area upper left breast, there is a irregular mass. In the upper-outer quadrant posterior right breast, there is a group of indeterminate microcalcifications measuring 2.7 x 2 x 1.7 cm.  Mammographic images were processed with CAD.  Targeted ultrasound is performed, showing irregular hypoechoic mass at the palpable area left breast 11:30 o'clock 2 cm from nipple measuring 4.5 x 3 x 4.4 cm. Smaller  immediate adjacent solid masses are identified near the pre dominant palpable mass. Ultrasound of the left axilla demonstrate lymph node with abnormal thickened cortex.  Ultrasound of the right axilla is negative.  IMPRESSION: Highly suspicious findings.  RECOMMENDATION: Ultrasound-guided core biopsy of left breast 11:30 o'clock mass and abnormal left axillary lymph node. Stereotactic core biopsy of indeterminate microcalcifications in the right breast.  I have discussed the findings and recommendations with the patient. If applicable, a reminder letter will be sent to the patient regarding the next appointment.  BI-RADS CATEGORY 5: Highly suggestive of malignancy.  pathology 04/28/2019 Diagnosis 1. Breast, left, needle core biopsy, 11:30 o'clock - INVASIVE DUCTAL CARCINOMA, SEE COMMENT. 2. Lymph node, needle/core biopsy, left axilla - LYMPH NODE POSITIVE FOR METASTATIC CARCINOMA. 3. Breast, right, needle core biopsy, UOQ - FIBROCYSTIC CHANGE WITH CALCIFICATIONS. - NO MALIGNANCY IDENTIFIED. Estrogen Receptor: 95%, POSITIVE, MODERATE STAINING INTENSITY Progesterone Receptor: 90%, POSITIVE, STRONG STAINING INTENSITY Proliferation Marker Ki67: 70% Results: GROUP 5: HER2 **NEGATIVE**   Past Surgical History Emeline Gins, CMA; 05/09/2019 9:18 AM) Breast Biopsy  Bilateral.  Diagnostic Studies History Emeline Gins, CMA; 05/09/2019 9:18 AM) Colonoscopy  never Mammogram  within last year Pap Smear  1-5 years ago  Allergies Emeline Gins, CMA; 05/09/2019 9:19 AM) No Known Drug Allergies [05/09/2019]: Allergies Reconciled   Medication History Emeline Gins, [place]; 05/09/2019 9:19 AM) Medications Reconciled  Social History Emeline Gins, [place]; 05/09/2019 9:18 AM) Alcohol use  Occasional alcohol use. Caffeine use  Carbonated beverages. No drug use  Tobacco use  Never smoker.  Family History Emeline Gins, [place]; 05/09/2019  9:18 AM) First Degree  Relatives  No pertinent family history   Pregnancy / Birth History Emeline Gins, [place]; 05/09/2019 9:18 AM) Age at menarche  46 years. Age of menopause  47-50 Contraceptive History  Oral contraceptives. Gravida  2 Maternal age  9-25 Para  2 Regular periods   Other Problems Emeline Gins, CMA; 05/09/2019 9:18 AM) Breast Cancer  Lump In Breast     Review of Systems Emeline Gins CMA; 05/09/2019 9:18 AM) General Present- Night Sweats. Not Present- Appetite Loss, Chills, Fatigue, Fever, Weight Gain and Weight Loss. Skin Not Present- Change in Wart/Mole, Dryness, Hives, Jaundice, New Lesions, Non-Healing Wounds, Rash and Ulcer. HEENT Present- Wears glasses/contact lenses. Not Present- Earache, Hearing Loss, Hoarseness, Nose Bleed, Oral Ulcers, Ringing in the Ears, Seasonal Allergies, Sinus Pain, Sore Throat, Visual Disturbances and Yellow Eyes. Respiratory Not Present- Bloody sputum, Chronic Cough, Difficulty Breathing, Snoring and Wheezing. Breast Present- Breast Mass. Not Present- Breast Pain, Nipple Discharge and Skin Changes. Cardiovascular Not Present- Chest Pain, Difficulty Breathing Lying Down, Leg Cramps, Palpitations, Rapid Heart Rate, Shortness of Breath and Swelling of Extremities. Gastrointestinal Not Present- Abdominal Pain, Bloating, Bloody Stool, Change in Bowel Habits, Chronic diarrhea, Constipation, Difficulty Swallowing, Excessive gas, Gets full quickly at meals, Hemorrhoids, Indigestion, Nausea, Rectal Pain and Vomiting. Female Genitourinary Not Present- Frequency, Nocturia, Painful Urination, Pelvic Pain and Urgency. Musculoskeletal Present- Swelling of Extremities. Not Present- Back Pain, Joint Pain, Joint Stiffness, Muscle Pain and Muscle Weakness. Neurological Not Present- Decreased Memory, Fainting, Headaches, Numbness, Seizures, Tingling, Tremor, Trouble walking and Weakness. Psychiatric Not Present- Anxiety, Bipolar, Change in Sleep Pattern,  Depression, Fearful and Frequent crying. Endocrine Not Present- Cold Intolerance, Excessive Hunger, Hair Changes, Heat Intolerance, Hot flashes and New Diabetes. Hematology Not Present- Blood Thinners, Easy Bruising, Excessive bleeding, Gland problems, HIV and Persistent Infections.  Vitals Emeline Gins CMA; 05/09/2019 9:19 AM) 05/09/2019 9:19 AM Weight: 205.8 lb Height: 68in Body Surface Area: 2.07 m Body Mass Index: 31.29 kg/m  Temp.: 97.49F  Pulse: 106 (Regular)  BP: 130/78 (Sitting, Left Arm, Standard)       Physical Exam Elizabeth Klein MD; 05/09/2019 10:37 AM) General Mental Status-Alert. General Appearance-Consistent with stated age. Hydration-Well hydrated. Voice-Normal.  Head and Neck Head-normocephalic, atraumatic with no lesions or palpable masses. Trachea-midline. Thyroid Gland Characteristics - normal size and consistency.  Eye Eyeball - Bilateral-Extraocular movements intact. Sclera/Conjunctiva - Bilateral-No scleral icterus.  Chest and Lung Exam Chest and lung exam reveals -quiet, even and easy respiratory effort with no use of accessory muscles and on auscultation, normal breath sounds, no adventitious sounds and normal vocal resonance. Inspection Chest Wall - Normal. Back - normal.  Breast Note: 5 cm mass upper central left breast. left breast much larger than right. no palpable LAD. mass is mobile and not tethered to skin although it is right under the skin. no skin dimpling or nipple retraction.   Cardiovascular Cardiovascular examination reveals -normal heart sounds, regular rate and rhythm with no murmurs and normal pedal pulses bilaterally.  Abdomen Inspection Inspection of the abdomen reveals - No Hernias. Palpation/Percussion Palpation and Percussion of the abdomen reveal - Soft, Non Tender, No Rebound tenderness, No Rigidity (guarding) and No hepatosplenomegaly. Auscultation Auscultation of the abdomen  reveals - Bowel sounds normal.  Neurologic Neurologic evaluation reveals -alert and oriented x 3 with no impairment of recent or remote memory. Mental Status-Normal.  Musculoskeletal Global Assessment -Note: no gross deformities.  Normal Exam - Left-Upper Extremity Strength Normal and Lower Extremity Strength Normal. Normal Exam -  Right-Upper Extremity Strength Normal and Lower Extremity Strength Normal.  Lymphatic Head & Neck  General Head & Neck Lymphatics: Bilateral - Description - Normal. Axillary  General Axillary Region: Bilateral - Description - Normal. Tenderness - Non Tender. Femoral & Inguinal  Generalized Femoral & Inguinal Lymphatics: Bilateral - Description - No Generalized lymphadenopathy.    Assessment & Plan Elizabeth Klein MD; 05/09/2019 10:41 AM) CANCER OF OVERLAPPING SITES OF LEFT BREAST (C50.812) Impression: The patient has a new diagnosis of left cT2N1 breast cancer. After review at multidisciplinary tx, we recommend neoadjuvant chemotherapy. I will order a breast MRI and put orders for port placement. Hopefully after chemo we can do lumpectomy and targeted node with sentinel lymph node biopsy, although the clip in the node migrated.  I discussed port placement with the patient and daughter in law. I will refer for oncology discussion. I reviewed risks. Current Plans Pt Education - ccs port insertion education Referred to Oncology, for evaluation and follow up (Oncology). Routine. MR BREAST BILAT WO/W CON (U7654) (new locally advanced left breast cancer) LEFT LEG SWELLING (M79.89) Impression: Pt has had extensive workup for left ankle swelling including vascular studies. Will get CT to look for pelvic reasons for venous obstruction. Current Plans CT ABDOMEN PELVIS W CON (65035) (extensive workup already done including vascular studies. new locally advanced breast cancer, looking for lymphadenopathy in pelvis. Also, patient has chronic nausea and  difficulty eating. eval liver and upper abd.) NAUSEA (R11.0) Impression: Pt has had nausea for around 4-5 months. She looks thinner to her family, but her weight is the same according to the patient. She has had early satiety. Will get CT abdomen to evaluate liver and stomach.    Signed electronically by Elizabeth Klein, MD (05/09/2019 10:42 AM)

## 2019-05-17 ENCOUNTER — Ambulatory Visit (HOSPITAL_COMMUNITY): Payer: BC Managed Care – PPO | Admitting: Anesthesiology

## 2019-05-17 ENCOUNTER — Ambulatory Visit (HOSPITAL_COMMUNITY)
Admission: RE | Admit: 2019-05-17 | Discharge: 2019-05-17 | Disposition: A | Discharge status: Home / Self Care | Payer: BC Managed Care – PPO | Attending: General Surgery | Admitting: General Surgery

## 2019-05-17 ENCOUNTER — Encounter (HOSPITAL_COMMUNITY)
Admission: RE | Disposition: A | Discharge status: Home / Self Care | Payer: Self-pay | Source: Home / Self Care | Attending: General Surgery

## 2019-05-17 ENCOUNTER — Encounter (HOSPITAL_COMMUNITY): Payer: Self-pay | Admitting: Certified Registered"

## 2019-05-17 ENCOUNTER — Other Ambulatory Visit: Payer: Self-pay

## 2019-05-17 ENCOUNTER — Telehealth: Payer: Self-pay | Admitting: *Deleted

## 2019-05-17 ENCOUNTER — Ambulatory Visit (HOSPITAL_COMMUNITY): Payer: BC Managed Care – PPO

## 2019-05-17 DIAGNOSIS — C50912 Malignant neoplasm of unspecified site of left female breast: Secondary | ICD-10-CM | POA: Diagnosis not present

## 2019-05-17 DIAGNOSIS — D649 Anemia, unspecified: Secondary | ICD-10-CM | POA: Diagnosis not present

## 2019-05-17 DIAGNOSIS — C50812 Malignant neoplasm of overlapping sites of left female breast: Secondary | ICD-10-CM | POA: Diagnosis not present

## 2019-05-17 DIAGNOSIS — Z170 Estrogen receptor positive status [ER+]: Secondary | ICD-10-CM | POA: Diagnosis not present

## 2019-05-17 DIAGNOSIS — R110 Nausea: Secondary | ICD-10-CM | POA: Insufficient documentation

## 2019-05-17 DIAGNOSIS — M7989 Other specified soft tissue disorders: Secondary | ICD-10-CM | POA: Insufficient documentation

## 2019-05-17 DIAGNOSIS — Z419 Encounter for procedure for purposes other than remedying health state, unspecified: Secondary | ICD-10-CM

## 2019-05-17 DIAGNOSIS — C50212 Malignant neoplasm of upper-inner quadrant of left female breast: Secondary | ICD-10-CM | POA: Diagnosis not present

## 2019-05-17 DIAGNOSIS — Z452 Encounter for adjustment and management of vascular access device: Secondary | ICD-10-CM | POA: Diagnosis not present

## 2019-05-17 DIAGNOSIS — Z95828 Presence of other vascular implants and grafts: Secondary | ICD-10-CM

## 2019-05-17 HISTORY — PX: PORTACATH PLACEMENT: SHX2246

## 2019-05-17 LAB — POCT PREGNANCY, URINE: Preg Test, Ur: NEGATIVE

## 2019-05-17 SURGERY — INSERTION, TUNNELED CENTRAL VENOUS DEVICE, WITH PORT
Anesthesia: General | Site: Chest | Laterality: Left

## 2019-05-17 MED ORDER — ACETAMINOPHEN 500 MG PO TABS
ORAL_TABLET | ORAL | Status: AC
  Filled 2019-05-17: qty 2

## 2019-05-17 MED ORDER — PROPOFOL 10 MG/ML IV BOLUS
INTRAVENOUS | Status: DC | PRN
  Administered 2019-05-17: 150 mg via INTRAVENOUS

## 2019-05-17 MED ORDER — CHLORHEXIDINE GLUCONATE CLOTH 2 % EX PADS: 6.0000 | MEDICATED_PAD | Freq: Once | CUTANEOUS | Status: DC

## 2019-05-17 MED ORDER — CEFAZOLIN SODIUM-DEXTROSE 2-4 GM/100ML-% IV SOLN
2.0000 g | INTRAVENOUS | Status: AC
  Administered 2019-05-17: 2 g via INTRAVENOUS
  Filled 2019-05-17: qty 100

## 2019-05-17 MED ORDER — MIDAZOLAM HCL 2 MG/2ML IJ SOLN
INTRAMUSCULAR | Status: AC
  Filled 2019-05-17: qty 2

## 2019-05-17 MED ORDER — DEXAMETHASONE SODIUM PHOSPHATE 10 MG/ML IJ SOLN
INTRAMUSCULAR | Status: AC
  Filled 2019-05-17: qty 1

## 2019-05-17 MED ORDER — LIDOCAINE HCL 1 % IJ SOLN
INTRAMUSCULAR | Status: DC | PRN
  Administered 2019-05-17: 18 mL

## 2019-05-17 MED ORDER — ACETAMINOPHEN 500 MG PO TABS
1000.0000 mg | ORAL_TABLET | ORAL | Status: AC
  Administered 2019-05-17: 1000 mg via ORAL

## 2019-05-17 MED ORDER — FENTANYL CITRATE (PF) 100 MCG/2ML IJ SOLN
INTRAMUSCULAR | Status: DC | PRN
  Administered 2019-05-17: 50 ug via INTRAVENOUS

## 2019-05-17 MED ORDER — MIDAZOLAM HCL 5 MG/5ML IJ SOLN
INTRAMUSCULAR | Status: DC | PRN
  Administered 2019-05-17: 2 mg via INTRAVENOUS

## 2019-05-17 MED ORDER — LIDOCAINE 2% (20 MG/ML) 5 ML SYRINGE
INTRAMUSCULAR | Status: DC | PRN
  Administered 2019-05-17: 100 mg via INTRAVENOUS

## 2019-05-17 MED ORDER — HEPARIN SOD (PORK) LOCK FLUSH 100 UNIT/ML IV SOLN
INTRAVENOUS | Status: DC | PRN
  Administered 2019-05-17: 500 [IU] via INTRAVENOUS

## 2019-05-17 MED ORDER — ONDANSETRON HCL 4 MG/2ML IJ SOLN
INTRAMUSCULAR | Status: AC
  Filled 2019-05-17: qty 2

## 2019-05-17 MED ORDER — OXYCODONE HCL 5 MG PO TABS: 5.0000 mg | ORAL_TABLET | Freq: Four times a day (QID) | ORAL | 0 refills | Status: DC | PRN

## 2019-05-17 MED ORDER — SODIUM CHLORIDE 0.9 % IV SOLN
INTRAVENOUS | Status: DC | PRN
  Administered 2019-05-17: 500 mL

## 2019-05-17 MED ORDER — PROMETHAZINE HCL 25 MG/ML IJ SOLN: 6.2500 mg | INTRAMUSCULAR | Status: DC | PRN

## 2019-05-17 MED ORDER — BUPIVACAINE-EPINEPHRINE 0.5% -1:200000 IJ SOLN
INTRAMUSCULAR | Status: AC
  Filled 2019-05-17: qty 1

## 2019-05-17 MED ORDER — ACETAMINOPHEN 500 MG PO TABS: 1000.0000 mg | ORAL_TABLET | Freq: Once | ORAL | Status: DC

## 2019-05-17 MED ORDER — STERILE WATER FOR IRRIGATION IR SOLN
Status: DC | PRN
  Administered 2019-05-17: 1000 mL

## 2019-05-17 MED ORDER — SODIUM CHLORIDE 0.9 % IV SOLN
INTRAVENOUS | Status: AC
  Filled 2019-05-17: qty 1.2

## 2019-05-17 MED ORDER — LIDOCAINE 2% (20 MG/ML) 5 ML SYRINGE
INTRAMUSCULAR | Status: AC
  Filled 2019-05-17: qty 5

## 2019-05-17 MED ORDER — DEXAMETHASONE SODIUM PHOSPHATE 10 MG/ML IJ SOLN
INTRAMUSCULAR | Status: DC | PRN
  Administered 2019-05-17: 10 mg via INTRAVENOUS

## 2019-05-17 MED ORDER — 0.9 % SODIUM CHLORIDE (POUR BTL) OPTIME
TOPICAL | Status: DC | PRN
  Administered 2019-05-17: 1000 mL

## 2019-05-17 MED ORDER — HEPARIN SOD (PORK) LOCK FLUSH 100 UNIT/ML IV SOLN
INTRAVENOUS | Status: AC
  Filled 2019-05-17: qty 5

## 2019-05-17 MED ORDER — LIDOCAINE HCL (PF) 1 % IJ SOLN
INTRAMUSCULAR | Status: AC
  Filled 2019-05-17: qty 30

## 2019-05-17 MED ORDER — FENTANYL CITRATE (PF) 100 MCG/2ML IJ SOLN: 25.0000 ug | INTRAMUSCULAR | Status: DC | PRN

## 2019-05-17 MED ORDER — LACTATED RINGERS IV SOLN
INTRAVENOUS | Status: DC
  Administered 2019-05-17: 11:00:00 via INTRAVENOUS

## 2019-05-17 MED ORDER — ONDANSETRON HCL 4 MG/2ML IJ SOLN
INTRAMUSCULAR | Status: DC | PRN
  Administered 2019-05-17: 4 mg via INTRAVENOUS

## 2019-05-17 MED ORDER — FENTANYL CITRATE (PF) 250 MCG/5ML IJ SOLN
INTRAMUSCULAR | Status: AC
  Filled 2019-05-17: qty 5

## 2019-05-17 MED ORDER — PROPOFOL 10 MG/ML IV BOLUS
INTRAVENOUS | Status: AC
  Filled 2019-05-17: qty 20

## 2019-05-17 SURGICAL SUPPLY — 43 items
BAG DECANTER FOR FLEXI CONT (MISCELLANEOUS) ×3 IMPLANT
CANISTER SUCT 3000ML PPV (MISCELLANEOUS) IMPLANT
CHLORAPREP W/TINT 26 (MISCELLANEOUS) ×3 IMPLANT
COVER SURGICAL LIGHT HANDLE (MISCELLANEOUS) ×3 IMPLANT
COVER TRANSDUCER ULTRASND GEL (DRAPE) IMPLANT
COVER WAND RF STERILE (DRAPES) IMPLANT
DECANTER SPIKE VIAL GLASS SM (MISCELLANEOUS) ×6 IMPLANT
DERMABOND ADVANCED (GAUZE/BANDAGES/DRESSINGS) ×2
DERMABOND ADVANCED .7 DNX12 (GAUZE/BANDAGES/DRESSINGS) ×1 IMPLANT
DRAPE C-ARM 42X120 X-RAY (DRAPES) ×3 IMPLANT
DRAPE CHEST BREAST 15X10 FENES (DRAPES) ×3 IMPLANT
DRAPE WARM FLUID 44X44 (DRAPES) IMPLANT
DRSG TEGADERM 4X4.75 (GAUZE/BANDAGES/DRESSINGS) ×6 IMPLANT
ELECT COATED BLADE 2.86 ST (ELECTRODE) ×3 IMPLANT
ELECT REM PT RETURN 9FT ADLT (ELECTROSURGICAL) ×3
ELECTRODE REM PT RTRN 9FT ADLT (ELECTROSURGICAL) ×1 IMPLANT
GAUZE 4X4 16PLY RFD (DISPOSABLE) ×3 IMPLANT
GAUZE SPONGE 4X4 12PLY STRL (GAUZE/BANDAGES/DRESSINGS) ×3 IMPLANT
GEL ULTRASOUND 20GR AQUASONIC (MISCELLANEOUS) IMPLANT
GLOVE BIO SURGEON STRL SZ 6 (GLOVE) ×3 IMPLANT
GLOVE INDICATOR 6.5 STRL GRN (GLOVE) ×3 IMPLANT
GOWN STRL REUS W/ TWL LRG LVL3 (GOWN DISPOSABLE) ×1 IMPLANT
GOWN STRL REUS W/TWL 2XL LVL3 (GOWN DISPOSABLE) ×3 IMPLANT
GOWN STRL REUS W/TWL LRG LVL3 (GOWN DISPOSABLE) ×2
KIT BASIN OR (CUSTOM PROCEDURE TRAY) ×3 IMPLANT
KIT PORT POWER 8FR ISP CVUE (Port) ×3 IMPLANT
KIT TURNOVER KIT B (KITS) ×3 IMPLANT
NEEDLE 22X1 1/2 (OR ONLY) (NEEDLE) ×3 IMPLANT
NS IRRIG 1000ML POUR BTL (IV SOLUTION) ×3 IMPLANT
PAD ARMBOARD 7.5X6 YLW CONV (MISCELLANEOUS) ×3 IMPLANT
PENCIL BUTTON HOLSTER BLD 10FT (ELECTRODE) ×3 IMPLANT
POSITIONER HEAD DONUT 9IN (MISCELLANEOUS) ×3 IMPLANT
SUT MON AB 4-0 PC3 18 (SUTURE) ×3 IMPLANT
SUT PROLENE 2 0 SH DA (SUTURE) ×6 IMPLANT
SUT VIC AB 3-0 SH 27 (SUTURE) ×2
SUT VIC AB 3-0 SH 27X BRD (SUTURE) ×1 IMPLANT
SYR 5ML LUER SLIP (SYRINGE) ×3 IMPLANT
TOWEL GREEN STERILE (TOWEL DISPOSABLE) ×3 IMPLANT
TOWEL GREEN STERILE FF (TOWEL DISPOSABLE) ×3 IMPLANT
TRAY LAPAROSCOPIC MC (CUSTOM PROCEDURE TRAY) ×3 IMPLANT
TUBE CONNECTING 12'X1/4 (SUCTIONS)
TUBE CONNECTING 12X1/4 (SUCTIONS) IMPLANT
YANKAUER SUCT BULB TIP NO VENT (SUCTIONS) IMPLANT

## 2019-05-17 NOTE — Interval H&P Note (Signed)
History and Physical Interval Note:  05/17/2019 12:45 PM  Elizabeth Sharp  has presented today for surgery, with the diagnosis of left breast cancer.  The various methods of treatment have been discussed with the patient and family. After consideration of risks, benefits and other options for treatment, the patient has consented to  Procedure(s): INSERTION PORT-A-CATH WITH POSSIBLE ULTRASOUND (N/A) as a surgical intervention.  The patient's history has been reviewed, patient examined, no change in status, stable for surgery.  I have reviewed the patient's chart and labs.  Questions were answered to the patient's satisfaction.     [name]

## 2019-05-17 NOTE — Transfer of Care (Signed)
Immediate Anesthesia Transfer of Care Note  Patient: Jalin Schab [facility]  Procedure(s) Performed: INSERTION PORT-A-CATH WITH POSSIBLE ULTRASOUND (Left Chest)  Patient Location: PACU  Anesthesia Type:General  Level of Consciousness: awake and patient cooperative  Airway & Oxygen Therapy: Patient Spontanous Breathing and Patient connected to nasal cannula oxygen  Post-op Assessment: Report given to RN, Post -op Vital signs reviewed and stable and Patient moving all extremities  Post vital signs: Reviewed and stable  Last Vitals:  Vitals Value Taken Time  BP 150/88 05/17/19 1351  Temp    Pulse 81 05/17/19 1351  Resp 17 05/17/19 1351  SpO2 100 % 05/17/19 1351  Vitals shown include unvalidated device data.  Last Pain:  Vitals:   05/17/19 1110  TempSrc:   PainSc: 0-No pain         Complications: No apparent anesthesia complications

## 2019-05-17 NOTE — Anesthesia Procedure Notes (Signed)
Procedure Name: LMA Insertion Date/Time: 05/17/2019 1:02 PM Performed by: Moshe Salisbury, CRNA Pre-anesthesia Checklist: Patient identified, Emergency Drugs available, Suction available and Patient being monitored Patient Re-evaluated:Patient Re-evaluated prior to induction Oxygen Delivery Method: Circle System Utilized Preoxygenation: Pre-oxygenation with 100% oxygen Induction Type: IV induction Ventilation: Mask ventilation without difficulty LMA: LMA inserted LMA Size: 5.0 Number of attempts: 1 Placement Confirmation: positive ETCO2 Tube secured with: Tape Dental Injury: Teeth and Oropharynx as per pre-operative assessment

## 2019-05-17 NOTE — Telephone Encounter (Signed)
Spoke with patient regarding chemo scheduled.  Since port insertion was moved up to today patient will received 1st chemo 12/10.  Discussed appointment changes with patient and she is aware.

## 2019-05-17 NOTE — Op Note (Signed)
PREOPERATIVE DIAGNOSIS:  Left breast cancer     POSTOPERATIVE DIAGNOSIS:  Same     PROCEDURE: left subclavian port placement, Bard ClearVue  Power Port, MRI safe, 8-French.      SURGEON:  [name], MD      ANESTHESIA:  General   FINDINGS:  Good venous return, easy flush, and tip of the catheter and   SVC 21 cm.      SPECIMEN:  None.      ESTIMATED BLOOD LOSS:  Minimal.      COMPLICATIONS:  None known.      PROCEDURE:  Pt was identified in the holding area and taken to   the operating room, where patient was placed supine on the operating room   table.  General anesthesia was induced.  Patient's arms were tucked and the upper   chest and neck were prepped and draped in sterile fashion.  Time-out was   performed according to the surgical safety check list.  When all was   correct, we continued.   Local anesthetic was administered over this   area at the angle of the clavicle.  The vein was accessed with 2 pass(es) of the needle. There was good venous return and the wire passed easily with no ectopy.   Fluoroscopy was used to confirm that the wire was in the vena cava.      The patient was placed back level and the area for the pocket was anethetized   with local anesthetic.  A 3-cm transverse incision was made with a #15   blade.  Cautery was used to divide the subcutaneous tissues down to the   pectoralis muscle.  An [employer] retractor was used to elevate the skin   while a pocket was created on top of the pectoralis fascia.  The port   was placed into the pocket to confirm that it was of adequate size.  The   catheter was preattached to the port.  The port was then secured to the   pectoralis fascia with four 2-0 Prolene sutures.  These were clamped and   not tied down yet.    The catheter was tunneled through to the wire exit   site.  The catheter was placed along the wire to determine what length it should be to be in the SVC.  The catheter was cut at 21 cm.  The  tunneler sheath and dilator were passed over the wire and the dilator and wire were removed.  The catheter was advanced through the tunneler sheath and the tunneler sheath was pulled away.  Care was taken to keep the catheter in the tunneler sheath as this occurred. This was advanced and the tunneler sheath was removed.  There was good venous   return and easy flush of the catheter.  The Prolene sutures were tied   down to the pectoral fascia.  The skin was reapproximated using 3-0   Vicryl interrupted deep dermal sutures.    Fluoroscopy was used to re-confirm good position of the catheter.  The skin   was then closed using 4-0 Monocryl in a subcuticular fashion.  The port was flushed with concentrated heparin flush as well.  The wounds were then cleaned, dried, and dressed with Dermabond.  The patient's port was left accessed.    The patient was awakened from anesthesia and taken to the PACU in stable condition.  Needle, sponge, and instrument counts were correct.  [name], MD

## 2019-05-17 NOTE — Anesthesia Preprocedure Evaluation (Addendum)
Anesthesia Evaluation  Patient identified by MRN, date of birth, ID band Patient awake    Reviewed: Allergy & Precautions, NPO status , Patient's Chart, lab work & pertinent test results  Airway Mallampati: II  TM Distance: >3 FB Neck ROM: Full    Dental  (+) Teeth Intact, Dental Advisory Given, Chipped,    Pulmonary neg pulmonary ROS,    Pulmonary exam normal breath sounds clear to auscultation       Cardiovascular Exercise Tolerance: Good negative cardio ROS Normal cardiovascular exam Rhythm:Regular Rate:Normal     Neuro/Psych negative neurological ROS  negative psych ROS   GI/Hepatic negative GI ROS, Neg liver ROS,   Endo/Other  negative endocrine ROS  Renal/GU negative Renal ROS     Musculoskeletal negative musculoskeletal ROS (+)   Abdominal   Peds  Hematology  (+) Blood dyscrasia, anemia ,   Anesthesia Other Findings Day of surgery medications reviewed with the patient.  Left breast cancer   Reproductive/Obstetrics                            Anesthesia Physical Anesthesia Plan  ASA: II  Anesthesia Plan: General   Post-op Pain Management:    Induction: Intravenous  PONV Risk Score and Plan: 3 and Midazolam, Dexamethasone and Ondansetron  Airway Management Planned: LMA  Additional Equipment:   Intra-op Plan:   Post-operative Plan: Extubation in OR  Informed Consent: I have reviewed the patients History and Physical, chart, labs and discussed the procedure including the risks, benefits and alternatives for the proposed anesthesia with the patient or authorized representative who has indicated his/her understanding and acceptance.     Dental advisory given  Plan Discussed with: CRNA  Anesthesia Plan Comments:         Anesthesia Quick Evaluation

## 2019-05-17 NOTE — Anesthesia Postprocedure Evaluation (Signed)
Anesthesia Post Note  Patient: Elizabeth Sharp [facility]  Procedure(s) Performed: INSERTION PORT-A-CATH WITH POSSIBLE ULTRASOUND (Left Chest)     Patient location during evaluation: PACU Anesthesia Type: General Level of consciousness: awake and alert Pain management: pain level controlled Vital Signs Assessment: post-procedure vital signs reviewed and stable Respiratory status: spontaneous breathing, nonlabored ventilation, respiratory function stable and patient connected to nasal cannula oxygen Cardiovascular status: blood pressure returned to baseline and stable Postop Assessment: no apparent nausea or vomiting Anesthetic complications: no    Last Vitals:  Vitals:   05/17/19 1431 05/17/19 1453  BP:  (!) 157/85  Pulse: 71   Resp: 18   Temp:    SpO2: 99% 99%    Last Pain:  Vitals:   05/17/19 1453  TempSrc:   PainSc: 0-No pain                 Catalina Gravel

## 2019-05-17 NOTE — Progress Notes (Signed)
Patient Care Team: Darrol Jump, PA-C as PCP - General (Family Medicine)  DIAGNOSIS:    ICD-10-CM   1. Malignant neoplasm of upper-inner quadrant of left breast in female, estrogen receptor positive ([place])  C50.212    Z17.0     SUMMARY OF ONCOLOGIC HISTORY: Oncology History  Malignant neoplasm of upper-inner quadrant of left breast in female, estrogen receptor positive ([place])  04/28/2019 Initial Diagnosis   Palpable left breast lump.  Mammogram 04/21/19 showed a 4.5cm mass at the 11:30 position with smaller adjacent solid masses and one abnormal left axillary lymph node. 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   05/10/2019 Cancer Staging   Staging form: Breast, AJCC 8th Edition - Clinical stage from 05/10/2019: Stage IIB (cT2, cN1, cM0, G3, ER+, PR+, HER2-) - Signed by Nicholas Lose, MD on 05/10/2019   05/20/2019 -  Chemotherapy   The patient had palonosetron (ALOXI) injection 0.25 mg, 0.25 mg, Intravenous,  Once, 0 of 4 cycles pegfilgrastim-jmdb (FULPHILA) injection 6 mg, 6 mg, Subcutaneous,  Once, 0 of 4 cycles cyclophosphamide (CYTOXAN) 1,280 mg in sodium chloride 0.9 % 250 mL chemo infusion, 600 mg/m2 = 1,280 mg, Intravenous,  Once, 0 of 4 cycles DOCEtaxel (TAXOTERE) 160 mg in sodium chloride 0.9 % 250 mL chemo infusion, 75 mg/m2 = 160 mg, Intravenous,  Once, 0 of 4 cycles fosaprepitant (EMEND) 150 mg in sodium chloride 0.9 % 145 mL IVPB, , Intravenous,  Once, 0 of 4 cycles  for chemotherapy treatment.      CHIEF COMPLIANT: Cycle 1 Taxotere and Cytoxan  INTERVAL HISTORY: Elizabeth Sharp is a 53 y.o. with above-mentioned history of left breast cancer currently undergoing neoadjuvant chemotherapy with Taxotere and Cytoxan. She underwent a port placement with Dr. Barry Dienes on 05/17/19.  She will start chemotherapy tomorrow.  REVIEW OF SYSTEMS:   Constitutional: Denies fevers,  chills or abnormal weight loss Eyes: Denies blurriness of vision Ears, nose, mouth, throat, and face: Denies mucositis or sore throat Respiratory: Denies cough, dyspnea or wheezes Cardiovascular: Denies palpitation, chest discomfort Gastrointestinal: Denies nausea, heartburn or change in bowel habits Skin: Denies abnormal skin rashes Lymphatics: Denies new lymphadenopathy or easy bruising Neurological: Denies numbness, tingling or new weaknesses Behavioral/Psych: Mood is stable, no new changes  Extremities: No lower extremity edema Breast: denies any pain or lumps or nodules in either breasts All other systems were reviewed with the patient and are negative.  I have reviewed the past medical history, past surgical history, social history and family history with the patient and they are unchanged from previous note.  ALLERGIES:  has No Known Allergies.  MEDICATIONS:  Current Outpatient Medications  Medication Sig Dispense Refill  . dexamethasone (DECADRON) 4 MG tablet Take 1 tablet (4 mg total) by mouth daily. Take 1 tablet day before chemo and 1 tablet day after chemo with food 8 tablet 0  . lidocaine-prilocaine (EMLA) cream Apply to affected area once 30 g 3  . LORazepam (ATIVAN) 0.5 MG tablet Take 1 tablet (0.5 mg total) by mouth at bedtime as needed (Nausea or vomiting). 30 tablet 0  . ondansetron (ZOFRAN) 8 MG tablet Take 1 tablet (8 mg total) by mouth 2 (two) times daily as needed for refractory nausea / vomiting. Start on day 3 after chemo. 30 tablet 1  . oxyCODONE (OXY IR/ROXICODONE) 5 MG immediate release tablet Take 1 tablet (5 mg total) by mouth every 6 (  six) hours as needed for severe pain. 10 tablet 0  . prochlorperazine (COMPAZINE) 10 MG tablet Take 1 tablet (10 mg total) by mouth every 6 (six) hours as needed (Nausea or vomiting). 30 tablet 1   No current facility-administered medications for this visit.     PHYSICAL EXAMINATION: ECOG PERFORMANCE STATUS: 1 - Symptomatic  but completely ambulatory  Vitals:   05/18/19 1011  BP: 133/83  Pulse: 81  Resp: 18  Temp: 98.1 F (36.7 C)  SpO2: 100%   Filed Weights   05/18/19 1011  Weight: 204 lb (92.5 kg)    GENERAL: alert, no distress and comfortable SKIN: skin color, texture, turgor are normal, no rashes or significant lesions EYES: normal, Conjunctiva are pink and non-injected, sclera clear OROPHARYNX: no exudate, no erythema and lips, buccal mucosa, and tongue normal  NECK: supple, thyroid normal size, non-tender, without nodularity LYMPH: no palpable lymphadenopathy in the cervical, axillary or inguinal LUNGS: clear to auscultation and percussion with normal breathing effort HEART: regular rate & rhythm and no murmurs and no lower extremity edema ABDOMEN: abdomen soft, non-tender and normal bowel sounds MUSCULOSKELETAL: no cyanosis of digits and no clubbing  NEURO: alert & oriented x 3 with fluent speech, no focal motor/sensory deficits EXTREMITIES: No lower extremity edema  LABORATORY DATA:  I have reviewed the data as listed CMP Latest Ref Rng & Units 05/16/2019  Glucose 70 - 99 mg/dL 119(H)  BUN 6 - 20 mg/dL 8  Creatinine 0.44 - 1.00 mg/dL 0.71  Sodium 135 - 145 mmol/L 139  Potassium 3.5 - 5.1 mmol/L 3.4(L)  Chloride 98 - 111 mmol/L 102  CO2 22 - 32 mmol/L 25  Calcium 8.9 - 10.3 mg/dL 9.0    Lab Results  Component Value Date   WBC 4.5 05/16/2019   HGB 10.6 (L) 05/16/2019   HCT 34.1 (L) 05/16/2019   MCV 94.7 05/16/2019   PLT 328 05/16/2019    ASSESSMENT & PLAN:  Malignant neoplasm of upper-inner quadrant of left breast in female, estrogen receptor positive ([place]) 04/28/2019: Palpable left breast lump, mammogram 4.5 cm mass at 11:30 position and adjacent smaller masses, 1 abnormal left axillary lymph node: Biopsy of breast and axilla both positive for grade 3 IDC ER 95%, PR 90%, Ki-67 70%, HER-2 negative T2N1 stage IIb clinical stage  Recommendation: 1. Neoadjuvant chemotherapy  with Taxotere and Cytoxan every 3 weeks x4 2.  Breast conserving surgery with targeted node dissection 3.  Adjuvant radiation therapy 4.  Followed by adjuvant antiestrogen therapy --------------------------------------------------------------------------------------------------------------------------------------------------------- Current Treatment: Taxotere Cytoxan q 3 weeks.  Tomorrow is cycle 1 day 1   Chemo consent obtained, Chemo education being done today For breast MRI has been moved. CT abdomen pelvis scheduled for Friday RTC in 1 week after chemo for Tox check    No orders of the defined types were placed in this encounter.  The patient has a good understanding of the overall plan. she agrees with it. she will call with any problems that may develop before the next visit here.  Nicholas Lose, MD 05/18/2019  Julious Oka Dorshimer, am acting as scribe for Dr. Nicholas Lose.  I have reviewed the above documentation for accuracy and completeness, and I agree with the above.

## 2019-05-17 NOTE — Discharge Instructions (Signed)
[facility] °Office Phone Number [phone] ° ° POST OP INSTRUCTIONS ° °Always review your discharge instruction sheet given to you by the facility where your surgery was performed. ° °IF YOU HAVE DISABILITY OR FAMILY LEAVE FORMS, YOU MUST BRING THEM TO THE OFFICE FOR PROCESSING.  DO NOT GIVE THEM TO YOUR DOCTOR. ° °1. A prescription for pain medication may be given to you upon discharge.  Take your pain medication as prescribed, if needed.  If narcotic pain medicine is not needed, then you may take acetaminophen (Tylenol) or ibuprofen (Advil) as needed. °2. Take your usually prescribed medications unless otherwise directed °3. If you need a refill on your pain medication, please contact your pharmacy.  They will contact our office to request authorization.  Prescriptions will not be filled after 5pm or on week-ends. °4. You should eat very light the first 24 hours after surgery, such as soup, crackers, pudding, etc.  Resume your normal diet the day after surgery °5. It is common to experience some constipation if taking pain medication after surgery.  Increasing fluid intake and taking a stool softener will usually help or prevent this problem from occurring.  A mild laxative (Milk of Magnesia or Miralax) should be taken according to package directions if there are no bowel movements after 48 hours. °6. You may shower in 48 hours.  The surgical glue will flake off in 2-3 weeks.   °7. ACTIVITIES:  No strenuous activity or heavy lifting for 1 week.   °a. You may drive when you no longer are taking prescription pain medication, you can comfortably wear a seatbelt, and you can safely maneuver your car and apply brakes. °b. RETURN TO WORK:  __________1 week_______________ °You should see your doctor in the office for a follow-up appointment approximately three-four weeks after your surgery.   ° °WHEN TO CALL YOUR DOCTOR: °1. Fever over 101.0 °2. Nausea and/or vomiting. °3. Extreme swelling or  bruising. °4. Continued bleeding from incision. °5. Increased pain, redness, or drainage from the incision. ° °The clinic staff is available to answer your questions during regular business hours.  Please don’t hesitate to call and ask to speak to one of the nurses for clinical concerns.  If you have a medical emergency, go to the nearest emergency room or call 911.  A surgeon from [facility] is always on call at the hospital. ° °For further questions, please visit [email or web address]  ° °

## 2019-05-18 ENCOUNTER — Inpatient Hospital Stay (HOSPITAL_BASED_OUTPATIENT_CLINIC_OR_DEPARTMENT_OTHER): Payer: BC Managed Care – PPO | Admitting: Hematology and Oncology

## 2019-05-18 ENCOUNTER — Encounter (HOSPITAL_COMMUNITY): Payer: Self-pay

## 2019-05-18 ENCOUNTER — Ambulatory Visit (HOSPITAL_COMMUNITY): Payer: BC Managed Care – PPO

## 2019-05-18 ENCOUNTER — Encounter: Payer: Self-pay | Admitting: Hematology and Oncology

## 2019-05-18 ENCOUNTER — Other Ambulatory Visit: Payer: Self-pay | Admitting: *Deleted

## 2019-05-18 ENCOUNTER — Other Ambulatory Visit: Payer: Self-pay

## 2019-05-18 ENCOUNTER — Inpatient Hospital Stay: Payer: BC Managed Care – PPO

## 2019-05-18 ENCOUNTER — Encounter (HOSPITAL_COMMUNITY): Payer: Self-pay | Admitting: General Surgery

## 2019-05-18 DIAGNOSIS — R5383 Other fatigue: Secondary | ICD-10-CM | POA: Diagnosis not present

## 2019-05-18 DIAGNOSIS — E611 Iron deficiency: Secondary | ICD-10-CM | POA: Diagnosis not present

## 2019-05-18 DIAGNOSIS — L659 Nonscarring hair loss, unspecified: Secondary | ICD-10-CM | POA: Diagnosis not present

## 2019-05-18 DIAGNOSIS — Z79899 Other long term (current) drug therapy: Secondary | ICD-10-CM | POA: Diagnosis not present

## 2019-05-18 DIAGNOSIS — D6481 Anemia due to antineoplastic chemotherapy: Secondary | ICD-10-CM | POA: Diagnosis not present

## 2019-05-18 DIAGNOSIS — Z5111 Encounter for antineoplastic chemotherapy: Secondary | ICD-10-CM | POA: Diagnosis not present

## 2019-05-18 DIAGNOSIS — C50212 Malignant neoplasm of upper-inner quadrant of left female breast: Secondary | ICD-10-CM

## 2019-05-18 DIAGNOSIS — R7989 Other specified abnormal findings of blood chemistry: Secondary | ICD-10-CM | POA: Diagnosis not present

## 2019-05-18 DIAGNOSIS — Z170 Estrogen receptor positive status [ER+]: Secondary | ICD-10-CM

## 2019-05-18 DIAGNOSIS — R222 Localized swelling, mass and lump, trunk: Secondary | ICD-10-CM | POA: Diagnosis not present

## 2019-05-18 DIAGNOSIS — M7989 Other specified soft tissue disorders: Secondary | ICD-10-CM | POA: Diagnosis not present

## 2019-05-18 DIAGNOSIS — M2550 Pain in unspecified joint: Secondary | ICD-10-CM | POA: Diagnosis not present

## 2019-05-18 DIAGNOSIS — R438 Other disturbances of smell and taste: Secondary | ICD-10-CM | POA: Diagnosis not present

## 2019-05-18 DIAGNOSIS — D259 Leiomyoma of uterus, unspecified: Secondary | ICD-10-CM | POA: Diagnosis not present

## 2019-05-18 DIAGNOSIS — Z5189 Encounter for other specified aftercare: Secondary | ICD-10-CM | POA: Diagnosis not present

## 2019-05-18 DIAGNOSIS — T451X5A Adverse effect of antineoplastic and immunosuppressive drugs, initial encounter: Secondary | ICD-10-CM | POA: Diagnosis not present

## 2019-05-18 NOTE — Assessment & Plan Note (Signed)
04/28/2019: Palpable left breast lump, mammogram 4.5 cm mass at 11:30 position and adjacent smaller masses, 1 abnormal left axillary lymph node: Biopsy of breast and axilla both positive for grade 3 IDC ER 95%, PR 90%, Ki-67 70%, HER-2 negative T2N1 stage IIb clinical stage  Recommendation: 1. Neoadjuvant chemotherapy with Taxotere and Cytoxan every 3 weeks x4 2.  Breast conserving surgery with targeted node dissection 3.  Adjuvant radiation therapy 4.  Followed by adjuvant antiestrogen therapy --------------------------------------------------------------------------------------------------------------------------------------------------------- Current Treatment: Taxotere Cytoxan q 3 weeks. Today is cycle 1 day 1 Labs reviewed Chemo consent obtained, Chemo education completed  RTC in 1 week for Tox check

## 2019-05-18 NOTE — Progress Notes (Signed)
Called ptto introduce myself as herFinancial Resource Specialist andtodiscuss copay assistance. Pt gave me consent to apply in herbehalf so I completed theonlineapplicationwith the[employer] for [employer]. Pt's app is pending so I will notify herof the outcome once I receive it. Pt is overqualified for the [employer] grant. Iwill giveher my card for any questions or concernsshe may have in the future at her next visit. 

## 2019-05-18 NOTE — Progress Notes (Signed)
Per MD okay to treat on 05/19/2019 with labs from 05/16/2019

## 2019-05-19 ENCOUNTER — Other Ambulatory Visit: Payer: Self-pay

## 2019-05-19 ENCOUNTER — Inpatient Hospital Stay: Payer: BC Managed Care – PPO

## 2019-05-19 ENCOUNTER — Encounter: Payer: Self-pay | Admitting: Hematology and Oncology

## 2019-05-19 VITALS — BP 146/87 | HR 73 | Temp 98.2°F | Resp 18

## 2019-05-19 DIAGNOSIS — Z170 Estrogen receptor positive status [ER+]: Secondary | ICD-10-CM

## 2019-05-19 DIAGNOSIS — C50212 Malignant neoplasm of upper-inner quadrant of left female breast: Secondary | ICD-10-CM | POA: Diagnosis not present

## 2019-05-19 DIAGNOSIS — T451X5A Adverse effect of antineoplastic and immunosuppressive drugs, initial encounter: Secondary | ICD-10-CM | POA: Diagnosis not present

## 2019-05-19 DIAGNOSIS — E611 Iron deficiency: Secondary | ICD-10-CM | POA: Diagnosis not present

## 2019-05-19 DIAGNOSIS — M2550 Pain in unspecified joint: Secondary | ICD-10-CM | POA: Diagnosis not present

## 2019-05-19 DIAGNOSIS — R5383 Other fatigue: Secondary | ICD-10-CM | POA: Diagnosis not present

## 2019-05-19 DIAGNOSIS — Z79899 Other long term (current) drug therapy: Secondary | ICD-10-CM | POA: Diagnosis not present

## 2019-05-19 DIAGNOSIS — Z5189 Encounter for other specified aftercare: Secondary | ICD-10-CM | POA: Diagnosis not present

## 2019-05-19 DIAGNOSIS — Z5111 Encounter for antineoplastic chemotherapy: Secondary | ICD-10-CM | POA: Diagnosis not present

## 2019-05-19 DIAGNOSIS — R222 Localized swelling, mass and lump, trunk: Secondary | ICD-10-CM | POA: Diagnosis not present

## 2019-05-19 DIAGNOSIS — R7989 Other specified abnormal findings of blood chemistry: Secondary | ICD-10-CM | POA: Diagnosis not present

## 2019-05-19 DIAGNOSIS — M7989 Other specified soft tissue disorders: Secondary | ICD-10-CM | POA: Diagnosis not present

## 2019-05-19 DIAGNOSIS — R438 Other disturbances of smell and taste: Secondary | ICD-10-CM | POA: Diagnosis not present

## 2019-05-19 DIAGNOSIS — L659 Nonscarring hair loss, unspecified: Secondary | ICD-10-CM | POA: Diagnosis not present

## 2019-05-19 DIAGNOSIS — D259 Leiomyoma of uterus, unspecified: Secondary | ICD-10-CM | POA: Diagnosis not present

## 2019-05-19 DIAGNOSIS — D6481 Anemia due to antineoplastic chemotherapy: Secondary | ICD-10-CM | POA: Diagnosis not present

## 2019-05-19 MED ORDER — PALONOSETRON HCL INJECTION 0.25 MG/5ML
0.2500 mg | Freq: Once | INTRAVENOUS | Status: AC
  Administered 2019-05-19: 08:00:00 0.25 mg via INTRAVENOUS

## 2019-05-19 MED ORDER — SODIUM CHLORIDE 0.9 % IV SOLN: 10.0000 mg | Freq: Once | INTRAVENOUS | Status: DC

## 2019-05-19 MED ORDER — PALONOSETRON HCL INJECTION 0.25 MG/5ML
INTRAVENOUS | Status: AC
  Filled 2019-05-19: qty 5

## 2019-05-19 MED ORDER — DEXAMETHASONE SODIUM PHOSPHATE 10 MG/ML IJ SOLN
INTRAMUSCULAR | Status: AC
  Filled 2019-05-19: qty 1

## 2019-05-19 MED ORDER — HEPARIN SOD (PORK) LOCK FLUSH 100 UNIT/ML IV SOLN
500.0000 [IU] | Freq: Once | INTRAVENOUS | Status: AC | PRN
  Administered 2019-05-19: 11:00:00 500 [IU]
  Filled 2019-05-19: qty 5

## 2019-05-19 MED ORDER — SODIUM CHLORIDE 0.9 % IV SOLN
Freq: Once | INTRAVENOUS | Status: AC
  Administered 2019-05-19: 08:00:00 via INTRAVENOUS
  Filled 2019-05-19: qty 5

## 2019-05-19 MED ORDER — SODIUM CHLORIDE 0.9 % IV SOLN
Freq: Once | INTRAVENOUS | Status: AC
  Administered 2019-05-19: 08:00:00 via INTRAVENOUS
  Filled 2019-05-19: qty 250

## 2019-05-19 MED ORDER — SODIUM CHLORIDE 0.9% FLUSH
10.0000 mL | INTRAVENOUS | Status: DC | PRN
  Administered 2019-05-19: 10 mL
  Filled 2019-05-19: qty 10

## 2019-05-19 MED ORDER — DEXAMETHASONE SODIUM PHOSPHATE 10 MG/ML IJ SOLN
10.0000 mg | Freq: Once | INTRAMUSCULAR | Status: AC
  Administered 2019-05-19: 08:00:00 10 mg via INTRAVENOUS

## 2019-05-19 MED ORDER — SODIUM CHLORIDE 0.9 % IV SOLN
75.0000 mg/m2 | Freq: Once | INTRAVENOUS | Status: AC
  Administered 2019-05-19: 160 mg via INTRAVENOUS
  Filled 2019-05-19: qty 16

## 2019-05-19 MED ORDER — SODIUM CHLORIDE 0.9 % IV SOLN
600.0000 mg/m2 | Freq: Once | INTRAVENOUS | Status: AC
  Administered 2019-05-19: 11:00:00 1280 mg via INTRAVENOUS
  Filled 2019-05-19: qty 64

## 2019-05-19 NOTE — Patient Instructions (Signed)
[facility] Discharge Instructions for Patients Receiving Chemotherapy  Today you received the following chemotherapy agents Docetaxel and Cyclophosphamide.  To help prevent nausea and vomiting after your treatment, we encourage you to take your nausea medication as directed.  If you develop nausea and vomiting that is not controlled by your nausea medication, call the clinic.   BELOW ARE SYMPTOMS THAT SHOULD BE REPORTED IMMEDIATELY:  *FEVER GREATER THAN 100.5 F  *CHILLS WITH OR WITHOUT FEVER  NAUSEA AND VOMITING THAT IS NOT CONTROLLED WITH YOUR NAUSEA MEDICATION  *UNUSUAL SHORTNESS OF BREATH  *UNUSUAL BRUISING OR BLEEDING  TENDERNESS IN MOUTH AND THROAT WITH OR WITHOUT PRESENCE OF ULCERS  *URINARY PROBLEMS  *BOWEL PROBLEMS  UNUSUAL RASH Items with * indicate a potential emergency and should be followed up as soon as possible.  Feel free to call the clinic should you have any questions or concerns. The clinic phone number is [phone].  Please show the [facility] at check-in to the Emergency Department and triage nurse.  Docetaxel injection What is this medicine? DOCETAXEL (doe se TAX el) is a chemotherapy drug. It targets fast dividing cells, like cancer cells, and causes these cells to die. This medicine is used to treat many types of cancers like breast cancer, certain stomach cancers, head and neck cancer, lung cancer, and prostate cancer. This medicine may be used for other purposes; ask your health care provider or pharmacist if you have questions. COMMON BRAND NAME(S): Docefrez, Taxotere What should I tell my health care provider before I take this medicine? They need to know if you have any of these conditions:  infection (especially a virus infection such as chickenpox, cold sores, or herpes)  liver disease  low blood counts, like low white cell, platelet, or red cell counts  an unusual or allergic reaction to docetaxel, polysorbate 80,  other chemotherapy agents, other medicines, foods, dyes, or preservatives  pregnant or trying to get pregnant  breast-feeding How should I use this medicine? This drug is given as an infusion into a vein. It is administered in a hospital or clinic by a specially trained health care professional. Talk to your pediatrician regarding the use of this medicine in children. Special care may be needed. Overdosage: If you think you have taken too much of this medicine contact a poison control center or emergency room at once. NOTE: This medicine is only for you. Do not share this medicine with others. What if I miss a dose? It is important not to miss your dose. Call your doctor or health care professional if you are unable to keep an appointment. What may interact with this medicine?  aprepitant  certain antibiotics like erythromycin or clarithromycin  certain antivirals for HIV or hepatitis  certain medicines for fungal infections like fluconazole, itraconazole, ketoconazole, posaconazole, or voriconazole  cimetidine  ciprofloxacin  conivaptan  cyclosporine  dronedarone  fluvoxamine  grapefruit juice  imatinib  verapamil This list may not describe all possible interactions. Give your health care provider a list of all the medicines, herbs, non-prescription drugs, or dietary supplements you use. Also tell them if you smoke, drink alcohol, or use illegal drugs. Some items may interact with your medicine. What should I watch for while using this medicine? Your condition will be monitored carefully while you are receiving this medicine. You will need important blood work done while you are taking this medicine. Call your doctor or health care professional for advice if you get a fever, chills  or sore throat, or other symptoms of a cold or flu. Do not treat yourself. This drug decreases your body's ability to fight infections. Try to avoid being around people who are sick. Some  products may contain alcohol. Ask your health care professional if this medicine contains alcohol. Be sure to tell all health care professionals you are taking this medicine. Certain medicines, like metronidazole and disulfiram, can cause an unpleasant reaction when taken with alcohol. The reaction includes flushing, headache, nausea, vomiting, sweating, and increased thirst. The reaction can last from 30 minutes to several hours. You may get drowsy or dizzy. Do not drive, use machinery, or do anything that needs mental alertness until you know how this medicine affects you. Do not stand or sit up quickly, especially if you are an older patient. This reduces the risk of dizzy or fainting spells. Alcohol may interfere with the effect of this medicine. Talk to your health care professional about your risk of cancer. You may be more at risk for certain types of cancer if you take this medicine. Do not become pregnant while taking this medicine or for 6 months after stopping it. Women should inform their doctor if they wish to become pregnant or think they might be pregnant. There is a potential for serious side effects to an unborn child. Talk to your health care professional or pharmacist for more information. Do not breast-feed an infant while taking this medicine or for 1 to 2 weeks after stopping it. Males who get this medicine must use a condom during sex with females who can get pregnant. If you get a woman pregnant, the baby could have birth defects. The baby could die before they are born. You will need to continue wearing a condom for 3 months after stopping the medicine. Tell your health care provider right away if your partner becomes pregnant while you are taking this medicine. This may interfere with the ability to father a child. You should talk to your doctor or health care professional if you are concerned about your fertility. What side effects may I notice from receiving this medicine? Side  effects that you should report to your doctor or health care professional as soon as possible:  allergic reactions like skin rash, itching or hives, swelling of the face, lips, or tongue  blurred vision  breathing problems  changes in vision  low blood counts - This drug may decrease the number of white blood cells, red blood cells and platelets. You may be at increased risk for infections and bleeding.  nausea and vomiting  pain, redness or irritation at site where injected  pain, tingling, numbness in the hands or feet  redness, blistering, peeling, or loosening of the skin, including inside the mouth  signs of decreased platelets or bleeding - bruising, pinpoint red spots on the skin, black, tarry stools, nosebleeds  signs of decreased red blood cells - unusually weak or tired, fainting spells, lightheadedness  signs of infection - fever or chills, cough, sore throat, pain or difficulty passing urine  swelling of the ankle, feet, hands Side effects that usually do not require medical attention (report to your doctor or health care professional if they continue or are bothersome):  constipation  diarrhea  fingernail or toenail changes  hair loss  loss of appetite  mouth sores  muscle pain This list may not describe all possible side effects. Call your doctor for medical advice about side effects. You may report side effects to FDA at  1-800-FDA-1088. Where should I keep my medicine? This drug is given in a hospital or clinic and will not be stored at home. NOTE: This sheet is a summary. It may not cover all possible information. If you have questions about this medicine, talk to your doctor, pharmacist, or health care provider.  2020 Elsevier/Gold Standard (2018-07-30 12:23:11)  Cyclophosphamide injection What is this medicine? CYCLOPHOSPHAMIDE (sye kloe FOSS fa mide) is a chemotherapy drug. It slows the growth of cancer cells. This medicine is used to treat many  types of cancer like lymphoma, myeloma, leukemia, breast cancer, and ovarian cancer, to name a few. This medicine may be used for other purposes; ask your health care provider or pharmacist if you have questions. COMMON BRAND NAME(S): Cytoxan, Neosar What should I tell my health care provider before I take this medicine? They need to know if you have any of these conditions:  blood disorders  history of other chemotherapy  infection  kidney disease  liver disease  recent or ongoing radiation therapy  tumors in the bone marrow  an unusual or allergic reaction to cyclophosphamide, other chemotherapy, other medicines, foods, dyes, or preservatives  pregnant or trying to get pregnant  breast-feeding How should I use this medicine? This drug is usually given as an injection into a vein or muscle or by infusion into a vein. It is administered in a hospital or clinic by a specially trained health care professional. Talk to your pediatrician regarding the use of this medicine in children. Special care may be needed. Overdosage: If you think you have taken too much of this medicine contact a poison control center or emergency room at once. NOTE: This medicine is only for you. Do not share this medicine with others. What if I miss a dose? It is important not to miss your dose. Call your doctor or health care professional if you are unable to keep an appointment. What may interact with this medicine? This medicine may interact with the following medications:  amiodarone  amphotericin B  azathioprine  certain antiviral medicines for HIV or AIDS such as protease inhibitors (e.g., indinavir, ritonavir) and zidovudine  certain blood pressure medications such as benazepril, captopril, enalapril, fosinopril, lisinopril, moexipril, monopril, perindopril, quinapril, ramipril, trandolapril  certain cancer medications such as anthracyclines (e.g., daunorubicin, doxorubicin), busulfan,  cytarabine, paclitaxel, pentostatin, tamoxifen, trastuzumab  certain diuretics such as chlorothiazide, chlorthalidone, hydrochlorothiazide, indapamide, metolazone  certain medicines that treat or prevent blood clots like warfarin  certain muscle relaxants such as succinylcholine  cyclosporine  etanercept  indomethacin  medicines to increase blood counts like filgrastim, pegfilgrastim, sargramostim  medicines used as general anesthesia  metronidazole  natalizumab This list may not describe all possible interactions. Give your health care provider a list of all the medicines, herbs, non-prescription drugs, or dietary supplements you use. Also tell them if you smoke, drink alcohol, or use illegal drugs. Some items may interact with your medicine. What should I watch for while using this medicine? Visit your doctor for checks on your progress. This drug may make you feel generally unwell. This is not uncommon, as chemotherapy can affect healthy cells as well as cancer cells. Report any side effects. Continue your course of treatment even though you feel ill unless your doctor tells you to stop. Drink water or other fluids as directed. Urinate often, even at night. In some cases, you may be given additional medicines to help with side effects. Follow all directions for their use. Call your doctor or health  care professional for advice if you get a fever, chills or sore throat, or other symptoms of a cold or flu. Do not treat yourself. This drug decreases your body's ability to fight infections. Try to avoid being around people who are sick. This medicine may increase your risk to bruise or bleed. Call your doctor or health care professional if you notice any unusual bleeding. Be careful brushing and flossing your teeth or using a toothpick because you may get an infection or bleed more easily. If you have any dental work done, tell your dentist you are receiving this medicine. You may get  drowsy or dizzy. Do not drive, use machinery, or do anything that needs mental alertness until you know how this medicine affects you. Do not become pregnant while taking this medicine or for 1 year after stopping it. Women should inform their doctor if they wish to become pregnant or think they might be pregnant. Men should not father a child while taking this medicine and for 4 months after stopping it. There is a potential for serious side effects to an unborn child. Talk to your health care professional or pharmacist for more information. Do not breast-feed an infant while taking this medicine. This medicine may interfere with the ability to have a child. This medicine has caused ovarian failure in some women. This medicine has caused reduced sperm counts in some men. You should talk with your doctor or health care professional if you are concerned about your fertility. If you are going to have surgery, tell your doctor or health care professional that you have taken this medicine. What side effects may I notice from receiving this medicine? Side effects that you should report to your doctor or health care professional as soon as possible:  allergic reactions like skin rash, itching or hives, swelling of the face, lips, or tongue  low blood counts - this medicine may decrease the number of white blood cells, red blood cells and platelets. You may be at increased risk for infections and bleeding.  signs of infection - fever or chills, cough, sore throat, pain or difficulty passing urine  signs of decreased platelets or bleeding - bruising, pinpoint red spots on the skin, black, tarry stools, blood in the urine  signs of decreased red blood cells - unusually weak or tired, fainting spells, lightheadedness  breathing problems  dark urine  dizziness  palpitations  swelling of the ankles, feet, hands  trouble passing urine or change in the amount of urine  weight gain  yellowing of the  eyes or skin Side effects that usually do not require medical attention (report to your doctor or health care professional if they continue or are bothersome):  changes in nail or skin color  hair loss  missed menstrual periods  mouth sores  nausea, vomiting This list may not describe all possible side effects. Call your doctor for medical advice about side effects. You may report side effects to FDA at 1-800-FDA-1088. Where should I keep my medicine? This drug is given in a hospital or clinic and will not be stored at home. NOTE: This sheet is a summary. It may not cover all possible information. If you have questions about this medicine, talk to your doctor, pharmacist, or health care provider.  2020 Elsevier/Gold Standard (2012-04-09 16:22:58)

## 2019-05-19 NOTE — Progress Notes (Signed)
Pt was approved w/ [facility] for [facility] from12/9/20 to12/8/21 for up to $10,000.The program reduces pt's copay responsibility to $0.

## 2019-05-20 ENCOUNTER — Other Ambulatory Visit: Payer: Self-pay

## 2019-05-20 ENCOUNTER — Inpatient Hospital Stay: Payer: BC Managed Care – PPO

## 2019-05-20 ENCOUNTER — Inpatient Hospital Stay: Payer: BC Managed Care – PPO | Admitting: Hematology and Oncology

## 2019-05-20 VITALS — BP 140/65 | HR 72 | Temp 98.2°F | Resp 18

## 2019-05-20 DIAGNOSIS — E611 Iron deficiency: Secondary | ICD-10-CM | POA: Diagnosis not present

## 2019-05-20 DIAGNOSIS — Z170 Estrogen receptor positive status [ER+]: Secondary | ICD-10-CM

## 2019-05-20 DIAGNOSIS — D6481 Anemia due to antineoplastic chemotherapy: Secondary | ICD-10-CM | POA: Diagnosis not present

## 2019-05-20 DIAGNOSIS — T451X5A Adverse effect of antineoplastic and immunosuppressive drugs, initial encounter: Secondary | ICD-10-CM | POA: Diagnosis not present

## 2019-05-20 DIAGNOSIS — R5383 Other fatigue: Secondary | ICD-10-CM | POA: Diagnosis not present

## 2019-05-20 DIAGNOSIS — Z79899 Other long term (current) drug therapy: Secondary | ICD-10-CM | POA: Diagnosis not present

## 2019-05-20 DIAGNOSIS — Z5189 Encounter for other specified aftercare: Secondary | ICD-10-CM | POA: Diagnosis not present

## 2019-05-20 DIAGNOSIS — D259 Leiomyoma of uterus, unspecified: Secondary | ICD-10-CM | POA: Diagnosis not present

## 2019-05-20 DIAGNOSIS — Z5111 Encounter for antineoplastic chemotherapy: Secondary | ICD-10-CM | POA: Diagnosis not present

## 2019-05-20 DIAGNOSIS — C50212 Malignant neoplasm of upper-inner quadrant of left female breast: Secondary | ICD-10-CM | POA: Diagnosis not present

## 2019-05-20 DIAGNOSIS — R7989 Other specified abnormal findings of blood chemistry: Secondary | ICD-10-CM | POA: Diagnosis not present

## 2019-05-20 DIAGNOSIS — M2550 Pain in unspecified joint: Secondary | ICD-10-CM | POA: Diagnosis not present

## 2019-05-20 DIAGNOSIS — M7989 Other specified soft tissue disorders: Secondary | ICD-10-CM | POA: Diagnosis not present

## 2019-05-20 DIAGNOSIS — R438 Other disturbances of smell and taste: Secondary | ICD-10-CM | POA: Diagnosis not present

## 2019-05-20 DIAGNOSIS — L659 Nonscarring hair loss, unspecified: Secondary | ICD-10-CM | POA: Diagnosis not present

## 2019-05-20 DIAGNOSIS — R222 Localized swelling, mass and lump, trunk: Secondary | ICD-10-CM | POA: Diagnosis not present

## 2019-05-20 MED ORDER — PEGFILGRASTIM-JMDB 6 MG/0.6ML ~~LOC~~ SOSY
6.0000 mg | PREFILLED_SYRINGE | Freq: Once | SUBCUTANEOUS | Status: AC
  Administered 2019-05-20: 6 mg via SUBCUTANEOUS

## 2019-05-20 MED ORDER — PEGFILGRASTIM-JMDB 6 MG/0.6ML ~~LOC~~ SOSY
PREFILLED_SYRINGE | SUBCUTANEOUS | Status: AC
  Filled 2019-05-20: qty 0.6

## 2019-05-20 NOTE — Patient Instructions (Signed)

## 2019-05-23 ENCOUNTER — Other Ambulatory Visit: Payer: Self-pay

## 2019-05-23 ENCOUNTER — Ambulatory Visit
Admission: RE | Admit: 2019-05-23 | Discharge: 2019-05-23 | Disposition: A | Discharge status: Home / Self Care | Payer: BC Managed Care – PPO | Source: Ambulatory Visit | Attending: General Surgery | Admitting: General Surgery

## 2019-05-23 ENCOUNTER — Ambulatory Visit: Payer: BC Managed Care – PPO

## 2019-05-23 DIAGNOSIS — R110 Nausea: Secondary | ICD-10-CM

## 2019-05-23 DIAGNOSIS — M7989 Other specified soft tissue disorders: Secondary | ICD-10-CM

## 2019-05-23 DIAGNOSIS — R638 Other symptoms and signs concerning food and fluid intake: Secondary | ICD-10-CM

## 2019-05-23 DIAGNOSIS — C50919 Malignant neoplasm of unspecified site of unspecified female breast: Secondary | ICD-10-CM | POA: Diagnosis not present

## 2019-05-23 MED ORDER — IOPAMIDOL (ISOVUE-300) INJECTION 61%
100.0000 mL | Freq: Once | INTRAVENOUS | Status: AC | PRN
  Administered 2019-05-23: 100 mL via INTRAVENOUS

## 2019-05-26 ENCOUNTER — Ambulatory Visit (HOSPITAL_COMMUNITY): Payer: BC Managed Care – PPO

## 2019-05-26 NOTE — Progress Notes (Signed)
Patient Care Team: Darrol Jump, PA-C as PCP - General (Family Medicine)  DIAGNOSIS:    ICD-10-CM   1. Malignant neoplasm of upper-inner quadrant of left breast in female, estrogen receptor positive ([place])  C50.212    Z17.0     SUMMARY OF ONCOLOGIC HISTORY: Oncology History  Malignant neoplasm of upper-inner quadrant of left breast in female, estrogen receptor positive ([place])  04/28/2019 Initial Diagnosis   Palpable left breast lump.  Mammogram 04/21/19 showed a 4.5cm mass at the 11:30 position with smaller adjacent solid masses and one abnormal left axillary lymph node. 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   05/10/2019 Cancer Staging   Staging form: Breast, AJCC 8th Edition - Clinical stage from 05/10/2019: Stage IIB (cT2, cN1, cM0, G3, ER+, PR+, HER2-) - Signed by Nicholas Lose, MD on 05/10/2019   05/19/2019 -  Chemotherapy   The patient had palonosetron (ALOXI) injection 0.25 mg, 0.25 mg, Intravenous,  Once, 1 of 4 cycles Administration: 0.25 mg (05/19/2019) pegfilgrastim-jmdb (FULPHILA) injection 6 mg, 6 mg, Subcutaneous,  Once, 1 of 4 cycles cyclophosphamide (CYTOXAN) 1,280 mg in sodium chloride 0.9 % 250 mL chemo infusion, 600 mg/m2 = 1,280 mg, Intravenous,  Once, 1 of 4 cycles Administration: 1,280 mg (05/19/2019) DOCEtaxel (TAXOTERE) 160 mg in sodium chloride 0.9 % 250 mL chemo infusion, 75 mg/m2 = 160 mg, Intravenous,  Once, 1 of 4 cycles Administration: 160 mg (05/19/2019) fosaprepitant (EMEND) 150 mg in sodium chloride 0.9 % 145 mL IVPB, , Intravenous,  Once, 1 of 4 cycles Administration:  (05/19/2019)  for chemotherapy treatment.      CHIEF COMPLIANT: Cycle 1 Day 8 Taxotere and Cytoxan  INTERVAL HISTORY: Elizabeth Sharp is a 53 y.o. with above-mentioned history of left breast cancer currently undergoing neoadjuvant chemotherapy with Taxotere and Cytoxan. She  presents to the clinic today for a toxicity check following cycle 1.   REVIEW OF SYSTEMS:   Constitutional: Denies fevers, chills or abnormal weight loss Eyes: Denies blurriness of vision Ears, nose, mouth, throat, and face: Denies mucositis or sore throat Respiratory: Denies cough, dyspnea or wheezes Cardiovascular: Denies palpitation, chest discomfort Gastrointestinal: Denies nausea, heartburn or change in bowel habits Skin: Denies abnormal skin rashes Lymphatics: Denies new lymphadenopathy or easy bruising Neurological: Denies numbness, tingling or new weaknesses Behavioral/Psych: Mood is stable, no new changes  Extremities: No lower extremity edema Breast: denies any pain or lumps or nodules in either breasts All other systems were reviewed with the patient and are negative.  I have reviewed the past medical history, past surgical history, social history and family history with the patient and they are unchanged from previous note.  ALLERGIES:  has No Known Allergies.  MEDICATIONS:  Current Outpatient Medications  Medication Sig Dispense Refill  . dexamethasone (DECADRON) 4 MG tablet Take 1 tablet (4 mg total) by mouth daily. Take 1 tablet day before chemo and 1 tablet day after chemo with food 8 tablet 0  . lidocaine-prilocaine (EMLA) cream Apply to affected area once 30 g 3  . LORazepam (ATIVAN) 0.5 MG tablet Take 1 tablet (0.5 mg total) by mouth at bedtime as needed (Nausea or vomiting). 30 tablet 0  . ondansetron (ZOFRAN) 8 MG tablet Take 1 tablet (8 mg total) by mouth 2 (two) times daily as needed for refractory nausea / vomiting. Start on day 3 after chemo. 30 tablet 1  . oxyCODONE (OXY IR/ROXICODONE)  5 MG immediate release tablet Take 1 tablet (5 mg total) by mouth every 6 (six) hours as needed for severe pain. 10 tablet 0  . prochlorperazine (COMPAZINE) 10 MG tablet Take 1 tablet (10 mg total) by mouth every 6 (six) hours as needed (Nausea or vomiting). 30 tablet 1   No  current facility-administered medications for this visit.    PHYSICAL EXAMINATION: ECOG PERFORMANCE STATUS: 1 - Symptomatic but completely ambulatory  There were no vitals filed for this visit. There were no vitals filed for this visit.  GENERAL: alert, no distress and comfortable SKIN: skin color, texture, turgor are normal, no rashes or significant lesions EYES: normal, Conjunctiva are pink and non-injected, sclera clear OROPHARYNX: no exudate, no erythema and lips, buccal mucosa, and tongue normal  NECK: supple, thyroid normal size, non-tender, without nodularity LYMPH: no palpable lymphadenopathy in the cervical, axillary or inguinal LUNGS: clear to auscultation and percussion with normal breathing effort HEART: regular rate & rhythm and no murmurs and no lower extremity edema ABDOMEN: abdomen soft, non-tender and normal bowel sounds MUSCULOSKELETAL: no cyanosis of digits and no clubbing  NEURO: alert & oriented x 3 with fluent speech, no focal motor/sensory deficits EXTREMITIES: No lower extremity edema  LABORATORY DATA:  I have reviewed the data as listed CMP Latest Ref Rng & Units 05/16/2019  Glucose 70 - 99 mg/dL 119(H)  BUN 6 - 20 mg/dL 8  Creatinine 0.44 - 1.00 mg/dL 0.71  Sodium 135 - 145 mmol/L 139  Potassium 3.5 - 5.1 mmol/L 3.4(L)  Chloride 98 - 111 mmol/L 102  CO2 22 - 32 mmol/L 25  Calcium 8.9 - 10.3 mg/dL 9.0    Lab Results  Component Value Date   WBC 17.3 (H) 05/27/2019   HGB 10.1 (L) 05/27/2019   HCT 32.6 (L) 05/27/2019   MCV 93.4 05/27/2019   PLT 304 05/27/2019   NEUTROABS PENDING 05/27/2019    ASSESSMENT & PLAN:  Malignant neoplasm of upper-inner quadrant of left breast in female, estrogen receptor positive ([place]) 04/28/2019: Palpable left breast lump, mammogram 4.5 cm mass at 11:30 position and adjacent smaller masses, 1 abnormal left axillary lymph node: Biopsy of breast and axilla both positive for grade 3 IDC ER 95%, PR 90%, Ki-67 70%, HER-2  negative T2N1 stage IIb clinical stage  Recommendation: 1.Neoadjuvant chemotherapywith Taxotere and Cytoxan every 3 weeks x4 2.Breast conserving surgery with targeted node dissection 3.Adjuvant radiation therapy 4.Followed by adjuvant antiestrogen therapy --------------------------------------------------------------------------------------------------------------------------------------------------------- Current Treatment: Taxotere Cytoxan q 3 weeks.   Today cycle 1 day 8 CT abdomen pelvis 05/23/2019: A few small indeterminate low-attenuation liver lesions, recommending MRI abdomen.  Small scattered retroperitoneal lymph nodes.  Enlarged fibroid uterus. Breast MRI scheduled for 06/01/2019  Chemo toxicities: 1.  Mild to moderate fatigue for 2 to 3 days 2.  Slight joint aches on day 2 and 3  Denies any nausea or vomiting. Energy levels are back to normal.  RTC in 2 weeks for cycle 2    No orders of the defined types were placed in this encounter.  The patient has a good understanding of the overall plan. she agrees with it. she will call with any problems that may develop before the next visit here.  Nicholas Lose, MD 05/27/2019  Julious Oka Dorshimer, am acting as scribe for Dr. Nicholas Lose.  I have reviewed the above document for accuracy and completeness, and I agree with the above.

## 2019-05-27 ENCOUNTER — Inpatient Hospital Stay: Payer: BC Managed Care – PPO

## 2019-05-27 ENCOUNTER — Other Ambulatory Visit: Payer: Self-pay

## 2019-05-27 ENCOUNTER — Other Ambulatory Visit: Payer: Self-pay | Admitting: General Surgery

## 2019-05-27 ENCOUNTER — Inpatient Hospital Stay (HOSPITAL_BASED_OUTPATIENT_CLINIC_OR_DEPARTMENT_OTHER): Payer: BC Managed Care – PPO | Admitting: Hematology and Oncology

## 2019-05-27 ENCOUNTER — Telehealth: Payer: Self-pay

## 2019-05-27 DIAGNOSIS — R7989 Other specified abnormal findings of blood chemistry: Secondary | ICD-10-CM | POA: Diagnosis not present

## 2019-05-27 DIAGNOSIS — R222 Localized swelling, mass and lump, trunk: Secondary | ICD-10-CM | POA: Diagnosis not present

## 2019-05-27 DIAGNOSIS — C50212 Malignant neoplasm of upper-inner quadrant of left female breast: Secondary | ICD-10-CM | POA: Diagnosis not present

## 2019-05-27 DIAGNOSIS — D259 Leiomyoma of uterus, unspecified: Secondary | ICD-10-CM | POA: Diagnosis not present

## 2019-05-27 DIAGNOSIS — Z5111 Encounter for antineoplastic chemotherapy: Secondary | ICD-10-CM | POA: Diagnosis not present

## 2019-05-27 DIAGNOSIS — R438 Other disturbances of smell and taste: Secondary | ICD-10-CM | POA: Diagnosis not present

## 2019-05-27 DIAGNOSIS — E611 Iron deficiency: Secondary | ICD-10-CM | POA: Diagnosis not present

## 2019-05-27 DIAGNOSIS — K769 Liver disease, unspecified: Secondary | ICD-10-CM | POA: Diagnosis not present

## 2019-05-27 DIAGNOSIS — T451X5A Adverse effect of antineoplastic and immunosuppressive drugs, initial encounter: Secondary | ICD-10-CM | POA: Diagnosis not present

## 2019-05-27 DIAGNOSIS — Z170 Estrogen receptor positive status [ER+]: Secondary | ICD-10-CM

## 2019-05-27 DIAGNOSIS — R110 Nausea: Secondary | ICD-10-CM

## 2019-05-27 DIAGNOSIS — L659 Nonscarring hair loss, unspecified: Secondary | ICD-10-CM | POA: Diagnosis not present

## 2019-05-27 DIAGNOSIS — D6481 Anemia due to antineoplastic chemotherapy: Secondary | ICD-10-CM | POA: Diagnosis not present

## 2019-05-27 DIAGNOSIS — R5383 Other fatigue: Secondary | ICD-10-CM | POA: Diagnosis not present

## 2019-05-27 DIAGNOSIS — M7989 Other specified soft tissue disorders: Secondary | ICD-10-CM | POA: Diagnosis not present

## 2019-05-27 DIAGNOSIS — Z79899 Other long term (current) drug therapy: Secondary | ICD-10-CM | POA: Diagnosis not present

## 2019-05-27 DIAGNOSIS — M2550 Pain in unspecified joint: Secondary | ICD-10-CM | POA: Diagnosis not present

## 2019-05-27 DIAGNOSIS — Z5189 Encounter for other specified aftercare: Secondary | ICD-10-CM | POA: Diagnosis not present

## 2019-05-27 LAB — CMP (CANCER CENTER ONLY)
ALT: 78 U/L — ABNORMAL HIGH (ref 0–44)
AST: 113 U/L — ABNORMAL HIGH (ref 15–41)
Albumin: 3.4 g/dL — ABNORMAL LOW (ref 3.5–5.0)
Alkaline Phosphatase: 113 U/L (ref 38–126)
Anion gap: 13 (ref 5–15)
BUN: 8 mg/dL (ref 6–20)
CO2: 26 mmol/L (ref 22–32)
Calcium: 9.3 mg/dL (ref 8.9–10.3)
Chloride: 100 mmol/L (ref 98–111)
Creatinine: 0.71 mg/dL (ref 0.44–1.00)
GFR, Est AFR Am: >60 mL/min (ref >60)
GFR, Estimated: >60 mL/min (ref >60)
Glucose, Bld: 109 mg/dL — ABNORMAL HIGH (ref 70–99)
Potassium: 4 mmol/L (ref 3.5–5.1)
Sodium: 139 mmol/L (ref 135–145)
Total Bilirubin: 0.4 mg/dL (ref 0.3–1.2)
Total Protein: 7.3 g/dL (ref 6.5–8.1)

## 2019-05-27 LAB — CBC WITH DIFFERENTIAL (CANCER CENTER ONLY)
Abs Immature Granulocytes: 4.92 10*3/uL — ABNORMAL HIGH (ref 0.00–0.07)
Basophils Absolute: 0 10*3/uL (ref 0.0–0.1)
Basophils Relative: 0 %
Eosinophils Absolute: 0.1 10*3/uL (ref 0.0–0.5)
Eosinophils Relative: 1 %
HCT: 32.6 % — ABNORMAL LOW (ref 36.0–46.0)
Hemoglobin: 10.1 g/dL — ABNORMAL LOW (ref 12.0–15.0)
Immature Granulocytes: 28 %
Lymphocytes Relative: 14 %
Lymphs Abs: 2.4 10*3/uL (ref 0.7–4.0)
MCH: 28.9 pg (ref 26.0–34.0)
MCHC: 31 g/dL (ref 30.0–36.0)
MCV: 93.4 fL (ref 80.0–100.0)
Monocytes Absolute: 1.6 10*3/uL — ABNORMAL HIGH (ref 0.1–1.0)
Monocytes Relative: 9 %
Neutro Abs: 8.4 10*3/uL — ABNORMAL HIGH (ref 1.7–7.7)
Neutrophils Relative %: 48 %
Platelet Count: 304 10*3/uL (ref 150–400)
RBC: 3.49 MIL/uL — ABNORMAL LOW (ref 3.87–5.11)
RDW: 14 % (ref 11.5–15.5)
WBC Count: 17.3 10*3/uL — ABNORMAL HIGH (ref 4.0–10.5)
nRBC: 1.4 % — ABNORMAL HIGH (ref 0.0–0.2)

## 2019-05-27 NOTE — Telephone Encounter (Signed)
MRI of liver approved, RN contact scheduling to see if this can be scheduled same day as MRI of breast.  Per central scheduling, unable to do so.  RN notified pt, pt voiced understanding.  Contact number given per patient request to schedule MRI Liver.

## 2019-05-27 NOTE — Assessment & Plan Note (Signed)
04/28/2019: Palpable left breast lump, mammogram 4.5 cm mass at 11:30 position and adjacent smaller masses, 1 abnormal left axillary lymph node: Biopsy of breast and axilla both positive for grade 3 IDC ER 95%, PR 90%, Ki-67 70%, HER-2 negative T2N1 stage IIb clinical stage  Recommendation: 1.Neoadjuvant chemotherapywith Taxotere and Cytoxan every 3 weeks x4 2.Breast conserving surgery with targeted node dissection 3.Adjuvant radiation therapy 4.Followed by adjuvant antiestrogen therapy --------------------------------------------------------------------------------------------------------------------------------------------------------- Current Treatment: Taxotere Cytoxan q 3 weeks.   Today cycle 1 day 8 CT abdomen pelvis 05/23/2019: A few small indeterminate low-attenuation liver lesions, recommending MRI abdomen.  Small scattered retroperitoneal lymph nodes.  Enlarged fibroid uterus. Breast MRI scheduled for 06/01/2019  Chemo toxicities:  RTC in 2 weeks for cycle 2

## 2019-06-01 ENCOUNTER — Encounter (HOSPITAL_COMMUNITY): Payer: Self-pay

## 2019-06-01 ENCOUNTER — Other Ambulatory Visit: Payer: Self-pay

## 2019-06-01 ENCOUNTER — Ambulatory Visit (HOSPITAL_COMMUNITY)
Admission: RE | Admit: 2019-06-01 | Discharge: 2019-06-01 | Disposition: A | Discharge status: Home / Self Care | Payer: BC Managed Care – PPO | Source: Ambulatory Visit | Attending: General Surgery | Admitting: General Surgery

## 2019-06-01 DIAGNOSIS — N6321 Unspecified lump in the left breast, upper outer quadrant: Secondary | ICD-10-CM | POA: Diagnosis not present

## 2019-06-01 DIAGNOSIS — Z170 Estrogen receptor positive status [ER+]: Secondary | ICD-10-CM | POA: Insufficient documentation

## 2019-06-01 DIAGNOSIS — R590 Localized enlarged lymph nodes: Secondary | ICD-10-CM | POA: Diagnosis not present

## 2019-06-01 DIAGNOSIS — C50212 Malignant neoplasm of upper-inner quadrant of left female breast: Secondary | ICD-10-CM | POA: Diagnosis not present

## 2019-06-01 MED ORDER — GADOBUTROL 1 MMOL/ML IV SOLN
10.0000 mL | Freq: Once | INTRAVENOUS | Status: AC | PRN
  Administered 2019-06-01: 10 mL via INTRAVENOUS

## 2019-06-06 ENCOUNTER — Other Ambulatory Visit: Payer: Self-pay

## 2019-06-06 ENCOUNTER — Other Ambulatory Visit: Payer: Self-pay | Admitting: General Surgery

## 2019-06-06 ENCOUNTER — Ambulatory Visit (HOSPITAL_COMMUNITY)
Admission: RE | Admit: 2019-06-06 | Discharge: 2019-06-06 | Disposition: A | Discharge status: Home / Self Care | Payer: BC Managed Care – PPO | Source: Ambulatory Visit | Attending: Hematology and Oncology | Admitting: Hematology and Oncology

## 2019-06-06 ENCOUNTER — Encounter: Payer: Self-pay | Admitting: *Deleted

## 2019-06-06 DIAGNOSIS — R9389 Abnormal findings on diagnostic imaging of other specified body structures: Secondary | ICD-10-CM

## 2019-06-06 DIAGNOSIS — K760 Fatty (change of) liver, not elsewhere classified: Secondary | ICD-10-CM | POA: Diagnosis not present

## 2019-06-06 DIAGNOSIS — K769 Liver disease, unspecified: Secondary | ICD-10-CM | POA: Insufficient documentation

## 2019-06-06 MED ORDER — GADOBUTROL 1 MMOL/ML IV SOLN
10.0000 mL | Freq: Once | INTRAVENOUS | Status: AC | PRN
  Administered 2019-06-06: 10 mL via INTRAVENOUS

## 2019-06-06 NOTE — Progress Notes (Signed)
Please let patient know she will need one additional left sided biopsy and one right sided biopsy.

## 2019-06-07 ENCOUNTER — Telehealth: Payer: Self-pay | Admitting: *Deleted

## 2019-06-07 ENCOUNTER — Encounter: Payer: Self-pay | Admitting: *Deleted

## 2019-06-07 NOTE — Telephone Encounter (Signed)
Called pt with negative live MRI results. No needs or concerns voiced at this time.

## 2019-06-08 NOTE — Progress Notes (Signed)
Patient Care Team: Darrol Jump, PA-C as PCP - General (Family Medicine) Rockwell Germany, RN as Oncology Nurse Navigator Mauro Kaufmann, RN as Oncology Nurse Navigator  DIAGNOSIS:    ICD-10-CM   1. Malignant neoplasm of upper-inner quadrant of left breast in female, estrogen receptor positive ([place])  C50.212    Z17.0     SUMMARY OF ONCOLOGIC HISTORY: Oncology History  Malignant neoplasm of upper-inner quadrant of left breast in female, estrogen receptor positive ([place])  04/28/2019 Initial Diagnosis   Palpable left breast lump.  Mammogram 04/21/19 showed a 4.5cm mass at the 11:30 position with smaller adjacent solid masses and one abnormal left axillary lymph node. 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   05/10/2019 Cancer Staging   Staging form: Breast, AJCC 8th Edition - Clinical stage from 05/10/2019: Stage IIB (cT2, cN1, cM0, G3, ER+, PR+, HER2-) - Signed by Nicholas Lose, MD on 05/10/2019   05/19/2019 -  Chemotherapy   The patient had palonosetron (ALOXI) injection 0.25 mg, 0.25 mg, Intravenous,  Once, 1 of 4 cycles Administration: 0.25 mg (05/19/2019) pegfilgrastim-jmdb (FULPHILA) injection 6 mg, 6 mg, Subcutaneous,  Once, 1 of 4 cycles Administration: 6 mg (05/20/2019) cyclophosphamide (CYTOXAN) 1,280 mg in sodium chloride 0.9 % 250 mL chemo infusion, 600 mg/m2 = 1,280 mg, Intravenous,  Once, 1 of 4 cycles Administration: 1,280 mg (05/19/2019) DOCEtaxel (TAXOTERE) 160 mg in sodium chloride 0.9 % 250 mL chemo infusion, 75 mg/m2 = 160 mg, Intravenous,  Once, 1 of 4 cycles Administration: 160 mg (05/19/2019) fosaprepitant (EMEND) 150 mg in sodium chloride 0.9 % 145 mL IVPB, , Intravenous,  Once, 1 of 4 cycles Administration:  (05/19/2019)  for chemotherapy treatment.      CHIEF COMPLIANT: Cycle 2 Taxotere and Cytoxan  INTERVAL HISTORY: Elizabeth Sharp is a 53 y.o. with  above-mentioned history of left breast cancer currently undergoing neoadjuvant chemotherapy with Taxotere and Cytoxan. Breast MRI on 06/01/19 showed the known 3.8cm mass in the upper outer left breast with a second adjacent 1.1cm mass spanning 4.5cm in total with skin thickening, a 1.3cm mass in the subareolar left breast, and a 4-24m indeterminate mass in the right breast. Liver MRI on 06/06/19 showed no evidence of metastasis and fatty infiltration corresponding to the CT abnormality. She presents to the clinic today for cycle 2 and to review her scans.   REVIEW OF SYSTEMS:   Constitutional: Denies fevers, chills or abnormal weight loss Eyes: Denies blurriness of vision Ears, nose, mouth, throat, and face: Denies mucositis or sore throat Respiratory: Denies cough, dyspnea or wheezes Cardiovascular: Denies palpitation, chest discomfort Gastrointestinal: Denies nausea, heartburn or change in bowel habits Skin: Denies abnormal skin rashes Lymphatics: Denies new lymphadenopathy or easy bruising Neurological: Denies numbness, tingling or new weaknesses Behavioral/Psych: Mood is stable, no new changes  Extremities: No lower extremity edema Breast: denies any pain or lumps or nodules in either breasts All other systems were reviewed with the patient and are negative.  I have reviewed the past medical history, past surgical history, social history and family history with the patient and they are unchanged from previous note.  ALLERGIES:  has No Known Allergies.  MEDICATIONS:  Current Outpatient Medications  Medication Sig Dispense Refill  . dexamethasone (DECADRON) 4 MG tablet Take 1 tablet (4 mg total) by mouth daily. Take 1 tablet day before chemo and 1 tablet day after chemo with food  8 tablet 0  . lidocaine-prilocaine (EMLA) cream Apply to affected area once 30 g 3  . LORazepam (ATIVAN) 0.5 MG tablet Take 1 tablet (0.5 mg total) by mouth at bedtime as needed (Nausea or vomiting). 30 tablet 0    . ondansetron (ZOFRAN) 8 MG tablet Take 1 tablet (8 mg total) by mouth 2 (two) times daily as needed for refractory nausea / vomiting. Start on day 3 after chemo. 30 tablet 1  . oxyCODONE (OXY IR/ROXICODONE) 5 MG immediate release tablet Take 1 tablet (5 mg total) by mouth every 6 (six) hours as needed for severe pain. 10 tablet 0  . prochlorperazine (COMPAZINE) 10 MG tablet Take 1 tablet (10 mg total) by mouth every 6 (six) hours as needed (Nausea or vomiting). 30 tablet 1   No current facility-administered medications for this visit.    PHYSICAL EXAMINATION: ECOG PERFORMANCE STATUS: 1 - Symptomatic but completely ambulatory  Vitals:   06/09/19 0939  BP: (!) 145/83  Pulse: 94  Resp: 18  Temp: 98.3 F (36.8 C)  SpO2: 96%   Filed Weights   06/09/19 0939  Weight: 203 lb 8 oz (92.3 kg)    GENERAL: alert, no distress and comfortable SKIN: skin color, texture, turgor are normal, no rashes or significant lesions EYES: normal, Conjunctiva are pink and non-injected, sclera clear OROPHARYNX: no exudate, no erythema and lips, buccal mucosa, and tongue normal  NECK: supple, thyroid normal size, non-tender, without nodularity LYMPH: no palpable lymphadenopathy in the cervical, axillary or inguinal LUNGS: clear to auscultation and percussion with normal breathing effort HEART: regular rate & rhythm and no murmurs and no lower extremity edema ABDOMEN: abdomen soft, non-tender and normal bowel sounds MUSCULOSKELETAL: no cyanosis of digits and no clubbing  NEURO: alert & oriented x 3 with fluent speech, no focal motor/sensory deficits EXTREMITIES: No lower extremity edema  LABORATORY DATA:  I have reviewed the data as listed CMP Latest Ref Rng & Units 06/09/2019 05/27/2019 05/16/2019  Glucose 70 - 99 mg/dL 113(H) 109(H) 119(H)  BUN 6 - 20 mg/dL '8 8 8  ' Creatinine 0.44 - 1.00 mg/dL 0.71 0.71 0.71  Sodium 135 - 145 mmol/L 140 139 139  Potassium 3.5 - 5.1 mmol/L 3.9 4.0 3.4(L)  Chloride 98  - 111 mmol/L 104 100 102  CO2 22 - 32 mmol/L '23 26 25  ' Calcium 8.9 - 10.3 mg/dL 8.4(L) 9.3 9.0  Total Protein 6.5 - 8.1 g/dL 7.1 7.3 -  Total Bilirubin 0.3 - 1.2 mg/dL 0.5 0.4 -  Alkaline Phos 38 - 126 U/L 83 113 -  AST 15 - 41 U/L 63(H) 113(H) -  ALT 0 - 44 U/L 50(H) 78(H) -    Lab Results  Component Value Date   WBC 3.5 (L) 06/09/2019   HGB 9.4 (L) 06/09/2019   HCT 29.7 (L) 06/09/2019   MCV 91.7 06/09/2019   PLT 365 06/09/2019   NEUTROABS 2.0 06/09/2019    ASSESSMENT & PLAN:  Malignant neoplasm of upper-inner quadrant of left breast in female, estrogen receptor positive ([place]) 04/28/2019: Palpable left breast lump, mammogram 4.5 cm mass at 11:30 position and adjacent smaller masses, 1 abnormal left axillary lymph node: Biopsy of breast and axilla both positive for grade 3 IDC ER 95%, PR 90%, Ki-67 70%, HER-2 negative T2N1 stage IIb clinical stage  Recommendation: 1.Neoadjuvant chemotherapywith Taxotere and Cytoxan every 3 weeks x4 2.Breast conserving surgery with targeted node dissection 3.Adjuvant radiation therapy 4.Followed by adjuvant antiestrogen therapy --------------------------------------------------------------------------------------------------------------------------------------------------------- Current Treatment: Taxotere Cytoxan q  3 weeks.  Today cycle 2 CT abdomen pelvis 05/23/2019: A few small indeterminate low-attenuation liver lesions, recommending MRI abdomen.  Small scattered retroperitoneal lymph nodes.  Enlarged fibroid uterus. Breast MRI scheduled for 06/01/2019  Chemo toxicities: 1.  Mild to moderate fatigue for 2 to 3 days 2.  Slight joint aches on day 2 and 3 3.  Hair loss: She shaved her head 4.  Lack of taste: I discussed with her about adding lemon and herbs to her food for taste 5.  Chemotherapy-induced anemia: We will check iron studies and B12 folate with the next blood work 6.  Elevated LFTs: Monitoring closely  Denies any  nausea or vomiting.  MRI abdomen 06/06/2019: Focal fatty infiltration left lobe of the liver no suspicious liver lesions, diffuse hepatic steatosis MRI breast 06/05/2019: 3.8 cm mass biopsy-proven malignancy.  A second 1.1 cm mass, total span 4.5 cm.  Suspicious 1.3 cm spiculated mass inferior subareolar left breast recommended MRI biopsy.  Focal mild skin thickening overlying index lesion.  Indeterminate 5 mm superior subareolar right breast mass MRI guided biopsy recommended.  To level 1 lymph nodes borderline cortical thickening identified  Patient has been set up for MRI guided biopsies.  06/20/2018 RTC in 3 weeks for cycle 3    No orders of the defined types were placed in this encounter.  The patient has a good understanding of the overall plan. she agrees with it. she will call with any problems that may develop before the next visit here.  Nicholas Lose, MD 06/09/2019  Julious Oka Dorshimer, am acting as scribe for Dr. Nicholas Lose.  I have reviewed the above document for accuracy and completeness, and I agree with the above.

## 2019-06-09 ENCOUNTER — Inpatient Hospital Stay: Payer: BC Managed Care – PPO

## 2019-06-09 ENCOUNTER — Other Ambulatory Visit: Payer: Self-pay

## 2019-06-09 ENCOUNTER — Inpatient Hospital Stay (HOSPITAL_BASED_OUTPATIENT_CLINIC_OR_DEPARTMENT_OTHER): Payer: BC Managed Care – PPO | Admitting: Hematology and Oncology

## 2019-06-09 DIAGNOSIS — M7989 Other specified soft tissue disorders: Secondary | ICD-10-CM | POA: Diagnosis not present

## 2019-06-09 DIAGNOSIS — Z170 Estrogen receptor positive status [ER+]: Secondary | ICD-10-CM

## 2019-06-09 DIAGNOSIS — C50212 Malignant neoplasm of upper-inner quadrant of left female breast: Secondary | ICD-10-CM | POA: Diagnosis not present

## 2019-06-09 DIAGNOSIS — M2550 Pain in unspecified joint: Secondary | ICD-10-CM | POA: Diagnosis not present

## 2019-06-09 DIAGNOSIS — T451X5A Adverse effect of antineoplastic and immunosuppressive drugs, initial encounter: Secondary | ICD-10-CM | POA: Diagnosis not present

## 2019-06-09 DIAGNOSIS — D6481 Anemia due to antineoplastic chemotherapy: Secondary | ICD-10-CM | POA: Diagnosis not present

## 2019-06-09 DIAGNOSIS — R222 Localized swelling, mass and lump, trunk: Secondary | ICD-10-CM | POA: Diagnosis not present

## 2019-06-09 DIAGNOSIS — L659 Nonscarring hair loss, unspecified: Secondary | ICD-10-CM | POA: Diagnosis not present

## 2019-06-09 DIAGNOSIS — R7989 Other specified abnormal findings of blood chemistry: Secondary | ICD-10-CM | POA: Diagnosis not present

## 2019-06-09 DIAGNOSIS — Z79899 Other long term (current) drug therapy: Secondary | ICD-10-CM | POA: Diagnosis not present

## 2019-06-09 DIAGNOSIS — R438 Other disturbances of smell and taste: Secondary | ICD-10-CM | POA: Diagnosis not present

## 2019-06-09 DIAGNOSIS — E611 Iron deficiency: Secondary | ICD-10-CM | POA: Diagnosis not present

## 2019-06-09 DIAGNOSIS — Z5189 Encounter for other specified aftercare: Secondary | ICD-10-CM | POA: Diagnosis not present

## 2019-06-09 DIAGNOSIS — R5383 Other fatigue: Secondary | ICD-10-CM | POA: Diagnosis not present

## 2019-06-09 DIAGNOSIS — Z5111 Encounter for antineoplastic chemotherapy: Secondary | ICD-10-CM | POA: Diagnosis not present

## 2019-06-09 DIAGNOSIS — D259 Leiomyoma of uterus, unspecified: Secondary | ICD-10-CM | POA: Diagnosis not present

## 2019-06-09 LAB — CMP (CANCER CENTER ONLY)
ALT: 50 U/L — ABNORMAL HIGH (ref 0–44)
AST: 63 U/L — ABNORMAL HIGH (ref 15–41)
Albumin: 3.6 g/dL (ref 3.5–5.0)
Alkaline Phosphatase: 83 U/L (ref 38–126)
Anion gap: 13 (ref 5–15)
BUN: 8 mg/dL (ref 6–20)
CO2: 23 mmol/L (ref 22–32)
Calcium: 8.4 mg/dL — ABNORMAL LOW (ref 8.9–10.3)
Chloride: 104 mmol/L (ref 98–111)
Creatinine: 0.71 mg/dL (ref 0.44–1.00)
GFR, Est AFR Am: >60 mL/min (ref >60)
GFR, Estimated: >60 mL/min (ref >60)
Glucose, Bld: 113 mg/dL — ABNORMAL HIGH (ref 70–99)
Potassium: 3.9 mmol/L (ref 3.5–5.1)
Sodium: 140 mmol/L (ref 135–145)
Total Bilirubin: 0.5 mg/dL (ref 0.3–1.2)
Total Protein: 7.1 g/dL (ref 6.5–8.1)

## 2019-06-09 LAB — CBC WITH DIFFERENTIAL (CANCER CENTER ONLY)
Abs Immature Granulocytes: 0.01 10*3/uL (ref 0.00–0.07)
Basophils Absolute: 0.1 10*3/uL (ref 0.0–0.1)
Basophils Relative: 3 %
Eosinophils Absolute: 0 10*3/uL (ref 0.0–0.5)
Eosinophils Relative: 1 %
HCT: 29.7 % — ABNORMAL LOW (ref 36.0–46.0)
Hemoglobin: 9.4 g/dL — ABNORMAL LOW (ref 12.0–15.0)
Immature Granulocytes: 0 %
Lymphocytes Relative: 27 %
Lymphs Abs: 1 10*3/uL (ref 0.7–4.0)
MCH: 29 pg (ref 26.0–34.0)
MCHC: 31.6 g/dL (ref 30.0–36.0)
MCV: 91.7 fL (ref 80.0–100.0)
Monocytes Absolute: 0.4 10*3/uL (ref 0.1–1.0)
Monocytes Relative: 12 %
Neutro Abs: 2 10*3/uL (ref 1.7–7.7)
Neutrophils Relative %: 57 %
Platelet Count: 365 10*3/uL (ref 150–400)
RBC: 3.24 MIL/uL — ABNORMAL LOW (ref 3.87–5.11)
RDW: 15.4 % (ref 11.5–15.5)
WBC Count: 3.5 10*3/uL — ABNORMAL LOW (ref 4.0–10.5)
nRBC: 0 % (ref 0.0–0.2)

## 2019-06-09 MED ORDER — SODIUM CHLORIDE 0.9 % IV SOLN
75.0000 mg/m2 | Freq: Once | INTRAVENOUS | Status: AC
  Administered 2019-06-09: 160 mg via INTRAVENOUS
  Filled 2019-06-09: qty 16

## 2019-06-09 MED ORDER — SODIUM CHLORIDE 0.9 % IV SOLN
Freq: Once | INTRAVENOUS | Status: AC
  Filled 2019-06-09: qty 250

## 2019-06-09 MED ORDER — HEPARIN SOD (PORK) LOCK FLUSH 100 UNIT/ML IV SOLN
500.0000 [IU] | Freq: Once | INTRAVENOUS | Status: AC | PRN
  Administered 2019-06-09: 500 [IU]
  Filled 2019-06-09: qty 5

## 2019-06-09 MED ORDER — PALONOSETRON HCL INJECTION 0.25 MG/5ML
0.2500 mg | Freq: Once | INTRAVENOUS | Status: AC
  Administered 2019-06-09: 0.25 mg via INTRAVENOUS

## 2019-06-09 MED ORDER — SODIUM CHLORIDE 0.9% FLUSH
10.0000 mL | INTRAVENOUS | Status: DC | PRN
  Administered 2019-06-09: 10 mL
  Filled 2019-06-09: qty 10

## 2019-06-09 MED ORDER — SODIUM CHLORIDE 0.9 % IV SOLN
Freq: Once | INTRAVENOUS | Status: AC
  Filled 2019-06-09: qty 5

## 2019-06-09 MED ORDER — PALONOSETRON HCL INJECTION 0.25 MG/5ML
INTRAVENOUS | Status: AC
  Filled 2019-06-09: qty 5

## 2019-06-09 MED ORDER — SODIUM CHLORIDE 0.9 % IV SOLN
600.0000 mg/m2 | Freq: Once | INTRAVENOUS | Status: AC
  Administered 2019-06-09: 1280 mg via INTRAVENOUS
  Filled 2019-06-09: qty 64

## 2019-06-09 NOTE — Assessment & Plan Note (Signed)
04/28/2019: Palpable left breast lump, mammogram 4.5 cm mass at 11:30 position and adjacent smaller masses, 1 abnormal left axillary lymph node: Biopsy of breast and axilla both positive for grade 3 IDC ER 95%, PR 90%, Ki-67 70%, HER-2 negative T2N1 stage IIb clinical stage  Recommendation: 1.Neoadjuvant chemotherapywith Taxotere and Cytoxan every 3 weeks x4 2.Breast conserving surgery with targeted node dissection 3.Adjuvant radiation therapy 4.Followed by adjuvant antiestrogen therapy --------------------------------------------------------------------------------------------------------------------------------------------------------- Current Treatment: Taxotere Cytoxan q 3 weeks.  Today cycle 2 CT abdomen pelvis 05/23/2019: A few small indeterminate low-attenuation liver lesions, recommending MRI abdomen.  Small scattered retroperitoneal lymph nodes.  Enlarged fibroid uterus. Breast MRI scheduled for 06/01/2019  Chemo toxicities: 1.  Mild to moderate fatigue for 2 to 3 days 2.  Slight joint aches on day 2 and 3  Denies any nausea or vomiting. Energy levels are back to normal.  MRI abdomen 06/06/2019: Focal fatty infiltration left lobe of the liver no suspicious liver lesions, diffuse hepatic steatosis MRI breast 06/05/2019: 3.8 cm mass biopsy-proven malignancy.  A second 1.1 cm mass, total span 4.5 cm.  Suspicious 1.3 cm spiculated mass inferior subareolar left breast recommended MRI biopsy.  Focal mild skin thickening overlying index lesion.  Indeterminate 5 mm superior subareolar right breast mass MRI guided biopsy recommended.  To level 1 lymph nodes borderline cortical thickening identified  Patient has been set up for MRI guided biopsies.  06/20/2018 RTC in 3 weeks for cycle 3

## 2019-06-09 NOTE — Patient Instructions (Signed)
[facility] Discharge Instructions for Patients Receiving Chemotherapy  Today you received the following chemotherapy agents Docetaxel and Cyclophosphamide.  To help prevent nausea and vomiting after your treatment, we encourage you to take your nausea medication as directed.  If you develop nausea and vomiting that is not controlled by your nausea medication, call the clinic.   BELOW ARE SYMPTOMS THAT SHOULD BE REPORTED IMMEDIATELY:  *FEVER GREATER THAN 100.5 F  *CHILLS WITH OR WITHOUT FEVER  NAUSEA AND VOMITING THAT IS NOT CONTROLLED WITH YOUR NAUSEA MEDICATION  *UNUSUAL SHORTNESS OF BREATH  *UNUSUAL BRUISING OR BLEEDING  TENDERNESS IN MOUTH AND THROAT WITH OR WITHOUT PRESENCE OF ULCERS  *URINARY PROBLEMS  *BOWEL PROBLEMS  UNUSUAL RASH Items with * indicate a potential emergency and should be followed up as soon as possible.  Feel free to call the clinic should you have any questions or concerns. The clinic phone number is [phone].  Please show the [facility] at check-in to the Emergency Department and triage nurse.

## 2019-06-11 ENCOUNTER — Inpatient Hospital Stay: Payer: BC Managed Care – PPO | Attending: Hematology and Oncology

## 2019-06-11 ENCOUNTER — Other Ambulatory Visit: Payer: Self-pay

## 2019-06-11 VITALS — BP 132/79 | HR 104 | Temp 98.2°F | Resp 20

## 2019-06-11 DIAGNOSIS — K760 Fatty (change of) liver, not elsewhere classified: Secondary | ICD-10-CM | POA: Insufficient documentation

## 2019-06-11 DIAGNOSIS — R7989 Other specified abnormal findings of blood chemistry: Secondary | ICD-10-CM | POA: Diagnosis not present

## 2019-06-11 DIAGNOSIS — R234 Changes in skin texture: Secondary | ICD-10-CM | POA: Insufficient documentation

## 2019-06-11 DIAGNOSIS — Z5111 Encounter for antineoplastic chemotherapy: Secondary | ICD-10-CM | POA: Diagnosis not present

## 2019-06-11 DIAGNOSIS — D6481 Anemia due to antineoplastic chemotherapy: Secondary | ICD-10-CM | POA: Insufficient documentation

## 2019-06-11 DIAGNOSIS — R110 Nausea: Secondary | ICD-10-CM | POA: Insufficient documentation

## 2019-06-11 DIAGNOSIS — N83209 Unspecified ovarian cyst, unspecified side: Secondary | ICD-10-CM | POA: Insufficient documentation

## 2019-06-11 DIAGNOSIS — T451X5A Adverse effect of antineoplastic and immunosuppressive drugs, initial encounter: Secondary | ICD-10-CM | POA: Insufficient documentation

## 2019-06-11 DIAGNOSIS — R5383 Other fatigue: Secondary | ICD-10-CM | POA: Insufficient documentation

## 2019-06-11 DIAGNOSIS — Z170 Estrogen receptor positive status [ER+]: Secondary | ICD-10-CM | POA: Insufficient documentation

## 2019-06-11 DIAGNOSIS — Z79899 Other long term (current) drug therapy: Secondary | ICD-10-CM | POA: Insufficient documentation

## 2019-06-11 DIAGNOSIS — Z5189 Encounter for other specified aftercare: Secondary | ICD-10-CM | POA: Diagnosis not present

## 2019-06-11 DIAGNOSIS — R040 Epistaxis: Secondary | ICD-10-CM | POA: Diagnosis not present

## 2019-06-11 DIAGNOSIS — D259 Leiomyoma of uterus, unspecified: Secondary | ICD-10-CM | POA: Insufficient documentation

## 2019-06-11 DIAGNOSIS — M7989 Other specified soft tissue disorders: Secondary | ICD-10-CM | POA: Insufficient documentation

## 2019-06-11 DIAGNOSIS — C773 Secondary and unspecified malignant neoplasm of axilla and upper limb lymph nodes: Secondary | ICD-10-CM | POA: Insufficient documentation

## 2019-06-11 DIAGNOSIS — N920 Excessive and frequent menstruation with regular cycle: Secondary | ICD-10-CM | POA: Diagnosis not present

## 2019-06-11 DIAGNOSIS — C50212 Malignant neoplasm of upper-inner quadrant of left female breast: Secondary | ICD-10-CM | POA: Insufficient documentation

## 2019-06-11 DIAGNOSIS — N6312 Unspecified lump in the right breast, upper inner quadrant: Secondary | ICD-10-CM | POA: Diagnosis not present

## 2019-06-11 DIAGNOSIS — D500 Iron deficiency anemia secondary to blood loss (chronic): Secondary | ICD-10-CM | POA: Insufficient documentation

## 2019-06-11 MED ORDER — PEGFILGRASTIM-JMDB 6 MG/0.6ML ~~LOC~~ SOSY
6.0000 mg | PREFILLED_SYRINGE | Freq: Once | SUBCUTANEOUS | Status: AC
  Administered 2019-06-11: 13:00:00 6 mg via SUBCUTANEOUS

## 2019-06-11 NOTE — Patient Instructions (Signed)

## 2019-06-20 ENCOUNTER — Telehealth: Payer: Self-pay | Admitting: *Deleted

## 2019-06-20 ENCOUNTER — Inpatient Hospital Stay (HOSPITAL_BASED_OUTPATIENT_CLINIC_OR_DEPARTMENT_OTHER): Payer: BC Managed Care – PPO | Admitting: Medical

## 2019-06-20 ENCOUNTER — Other Ambulatory Visit: Payer: Self-pay

## 2019-06-20 ENCOUNTER — Encounter: Payer: Self-pay | Admitting: *Deleted

## 2019-06-20 ENCOUNTER — Other Ambulatory Visit: Payer: Self-pay | Admitting: *Deleted

## 2019-06-20 ENCOUNTER — Inpatient Hospital Stay: Payer: BC Managed Care – PPO

## 2019-06-20 VITALS — BP 163/103 | HR 105 | Temp 98.7°F | Resp 20 | Ht 69.0 in | Wt 207.7 lb

## 2019-06-20 DIAGNOSIS — C50212 Malignant neoplasm of upper-inner quadrant of left female breast: Secondary | ICD-10-CM

## 2019-06-20 DIAGNOSIS — N6312 Unspecified lump in the right breast, upper inner quadrant: Secondary | ICD-10-CM | POA: Diagnosis not present

## 2019-06-20 DIAGNOSIS — R110 Nausea: Secondary | ICD-10-CM | POA: Diagnosis not present

## 2019-06-20 DIAGNOSIS — Z5189 Encounter for other specified aftercare: Secondary | ICD-10-CM | POA: Diagnosis not present

## 2019-06-20 DIAGNOSIS — Z170 Estrogen receptor positive status [ER+]: Secondary | ICD-10-CM

## 2019-06-20 DIAGNOSIS — R5383 Other fatigue: Secondary | ICD-10-CM | POA: Diagnosis not present

## 2019-06-20 DIAGNOSIS — D649 Anemia, unspecified: Secondary | ICD-10-CM

## 2019-06-20 DIAGNOSIS — N921 Excessive and frequent menstruation with irregular cycle: Secondary | ICD-10-CM | POA: Diagnosis not present

## 2019-06-20 DIAGNOSIS — R040 Epistaxis: Secondary | ICD-10-CM | POA: Diagnosis not present

## 2019-06-20 DIAGNOSIS — R7989 Other specified abnormal findings of blood chemistry: Secondary | ICD-10-CM | POA: Diagnosis not present

## 2019-06-20 DIAGNOSIS — C773 Secondary and unspecified malignant neoplasm of axilla and upper limb lymph nodes: Secondary | ICD-10-CM | POA: Diagnosis not present

## 2019-06-20 DIAGNOSIS — M7989 Other specified soft tissue disorders: Secondary | ICD-10-CM | POA: Diagnosis not present

## 2019-06-20 DIAGNOSIS — D500 Iron deficiency anemia secondary to blood loss (chronic): Secondary | ICD-10-CM | POA: Diagnosis not present

## 2019-06-20 DIAGNOSIS — Z5111 Encounter for antineoplastic chemotherapy: Secondary | ICD-10-CM | POA: Diagnosis not present

## 2019-06-20 DIAGNOSIS — N920 Excessive and frequent menstruation with regular cycle: Secondary | ICD-10-CM | POA: Diagnosis not present

## 2019-06-20 DIAGNOSIS — D259 Leiomyoma of uterus, unspecified: Secondary | ICD-10-CM | POA: Diagnosis not present

## 2019-06-20 DIAGNOSIS — T451X5A Adverse effect of antineoplastic and immunosuppressive drugs, initial encounter: Secondary | ICD-10-CM | POA: Diagnosis not present

## 2019-06-20 DIAGNOSIS — D6481 Anemia due to antineoplastic chemotherapy: Secondary | ICD-10-CM | POA: Diagnosis not present

## 2019-06-20 LAB — ABO/RH: ABO/RH(D): O NEG

## 2019-06-20 LAB — IRON AND TIBC
Iron: 44 ug/dL (ref 41–142)
Saturation Ratios: 13 % — ABNORMAL LOW (ref 21–57)
TIBC: 353 ug/dL (ref 236–444)
UIBC: 308 ug/dL (ref 120–384)

## 2019-06-20 LAB — CBC WITH DIFFERENTIAL (CANCER CENTER ONLY)
Abs Immature Granulocytes: 1.57 10*3/uL — ABNORMAL HIGH (ref 0.00–0.07)
Basophils Absolute: 0.1 10*3/uL (ref 0.0–0.1)
Basophils Relative: 0 %
Eosinophils Absolute: 0 10*3/uL (ref 0.0–0.5)
Eosinophils Relative: 0 %
HCT: 25.6 % — ABNORMAL LOW (ref 36.0–46.0)
Hemoglobin: 7.8 g/dL — ABNORMAL LOW (ref 12.0–15.0)
Immature Granulocytes: 11 %
Lymphocytes Relative: 12 %
Lymphs Abs: 1.6 10*3/uL (ref 0.7–4.0)
MCH: 28.5 pg (ref 26.0–34.0)
MCHC: 30.5 g/dL (ref 30.0–36.0)
MCV: 93.4 fL (ref 80.0–100.0)
Monocytes Absolute: 1 10*3/uL (ref 0.1–1.0)
Monocytes Relative: 7 %
Neutro Abs: 9.8 10*3/uL — ABNORMAL HIGH (ref 1.7–7.7)
Neutrophils Relative %: 70 %
Platelet Count: 154 10*3/uL (ref 150–400)
RBC: 2.74 MIL/uL — ABNORMAL LOW (ref 3.87–5.11)
RDW: 16.2 % — ABNORMAL HIGH (ref 11.5–15.5)
WBC Count: 14.1 10*3/uL — ABNORMAL HIGH (ref 4.0–10.5)
nRBC: 0.9 % — ABNORMAL HIGH (ref 0.0–0.2)

## 2019-06-20 LAB — CMP (CANCER CENTER ONLY)
ALT: 102 U/L — ABNORMAL HIGH (ref 0–44)
AST: 125 U/L — ABNORMAL HIGH (ref 15–41)
Albumin: 3.3 g/dL — ABNORMAL LOW (ref 3.5–5.0)
Alkaline Phosphatase: 136 U/L — ABNORMAL HIGH (ref 38–126)
Anion gap: 11 (ref 5–15)
BUN: 9 mg/dL (ref 6–20)
CO2: 27 mmol/L (ref 22–32)
Calcium: 8.3 mg/dL — ABNORMAL LOW (ref 8.9–10.3)
Chloride: 102 mmol/L (ref 98–111)
Creatinine: 0.63 mg/dL (ref 0.44–1.00)
GFR, Est AFR Am: >60 mL/min (ref >60)
GFR, Estimated: >60 mL/min (ref >60)
Glucose, Bld: 98 mg/dL (ref 70–99)
Potassium: 4 mmol/L (ref 3.5–5.1)
Sodium: 140 mmol/L (ref 135–145)
Total Bilirubin: 0.5 mg/dL (ref 0.3–1.2)
Total Protein: 6.7 g/dL (ref 6.5–8.1)

## 2019-06-20 LAB — PREPARE RBC (CROSSMATCH)

## 2019-06-20 LAB — FERRITIN: Ferritin: 289 ng/mL (ref 11–307)

## 2019-06-20 LAB — VITAMIN B12: Vitamin B-12: 3368 pg/mL — ABNORMAL HIGH (ref 180–914)

## 2019-06-20 LAB — SAMPLE TO BLOOD BANK

## 2019-06-20 LAB — FOLATE: Folate: 8.1 ng/mL (ref >5.9)

## 2019-06-20 MED ORDER — DEXAMETHASONE 4 MG PO TABS: 4.0000 mg | ORAL_TABLET | Freq: Every day | ORAL | 0 refills | Status: DC

## 2019-06-20 MED ORDER — HEPARIN SOD (PORK) LOCK FLUSH 100 UNIT/ML IV SOLN
500.0000 [IU] | Freq: Once | INTRAVENOUS | Status: AC
  Administered 2019-06-20: 14:00:00 500 [IU] via INTRAVENOUS
  Filled 2019-06-20: qty 5

## 2019-06-20 NOTE — Progress Notes (Signed)
CRITICAL VALUE ALERT  Critical Value:  Hgb 7.8  Date & Time Notied:  06/20/2019 at 1321  Provider Notified: Learta Codding, RN with [facility]  Orders Received/Actions taken: Verbalized understanding.

## 2019-06-20 NOTE — Patient Instructions (Signed)

## 2019-06-20 NOTE — Telephone Encounter (Signed)
Received call from pt stating she has been experiencing heavy menstrual bleeding with clots x1 week.  Pt also states she experienced a nose bleed today.  Pt states she does feel more fatigued than usual and is concerned her hemoglobin has dropped.  Per. MD, pt to be seen in [facility] today.  [facility] RN Liza aware and high priority message sent to scheduling.

## 2019-06-21 ENCOUNTER — Ambulatory Visit
Admission: RE | Admit: 2019-06-21 | Discharge: 2019-06-21 | Disposition: A | Discharge status: Home / Self Care | Payer: BC Managed Care – PPO | Source: Ambulatory Visit | Attending: General Surgery | Admitting: General Surgery

## 2019-06-21 ENCOUNTER — Inpatient Hospital Stay: Payer: BC Managed Care – PPO

## 2019-06-21 ENCOUNTER — Other Ambulatory Visit: Payer: Self-pay

## 2019-06-21 ENCOUNTER — Other Ambulatory Visit: Payer: Self-pay | Admitting: General Surgery

## 2019-06-21 ENCOUNTER — Other Ambulatory Visit (HOSPITAL_COMMUNITY): Payer: Self-pay | Admitting: Diagnostic Radiology

## 2019-06-21 VITALS — BP 155/93 | HR 94 | Temp 98.2°F | Resp 18

## 2019-06-21 DIAGNOSIS — N920 Excessive and frequent menstruation with regular cycle: Secondary | ICD-10-CM | POA: Diagnosis not present

## 2019-06-21 DIAGNOSIS — C50012 Malignant neoplasm of nipple and areola, left female breast: Secondary | ICD-10-CM | POA: Diagnosis not present

## 2019-06-21 DIAGNOSIS — N6489 Other specified disorders of breast: Secondary | ICD-10-CM | POA: Diagnosis not present

## 2019-06-21 DIAGNOSIS — N6011 Diffuse cystic mastopathy of right breast: Secondary | ICD-10-CM | POA: Diagnosis not present

## 2019-06-21 DIAGNOSIS — R040 Epistaxis: Secondary | ICD-10-CM | POA: Diagnosis not present

## 2019-06-21 DIAGNOSIS — D259 Leiomyoma of uterus, unspecified: Secondary | ICD-10-CM | POA: Diagnosis not present

## 2019-06-21 DIAGNOSIS — R9389 Abnormal findings on diagnostic imaging of other specified body structures: Secondary | ICD-10-CM

## 2019-06-21 DIAGNOSIS — D6481 Anemia due to antineoplastic chemotherapy: Secondary | ICD-10-CM | POA: Diagnosis not present

## 2019-06-21 DIAGNOSIS — C773 Secondary and unspecified malignant neoplasm of axilla and upper limb lymph nodes: Secondary | ICD-10-CM | POA: Diagnosis not present

## 2019-06-21 DIAGNOSIS — C50212 Malignant neoplasm of upper-inner quadrant of left female breast: Secondary | ICD-10-CM

## 2019-06-21 DIAGNOSIS — R5383 Other fatigue: Secondary | ICD-10-CM | POA: Diagnosis not present

## 2019-06-21 DIAGNOSIS — Z170 Estrogen receptor positive status [ER+]: Secondary | ICD-10-CM | POA: Diagnosis not present

## 2019-06-21 DIAGNOSIS — R110 Nausea: Secondary | ICD-10-CM | POA: Diagnosis not present

## 2019-06-21 DIAGNOSIS — D649 Anemia, unspecified: Secondary | ICD-10-CM

## 2019-06-21 DIAGNOSIS — D500 Iron deficiency anemia secondary to blood loss (chronic): Secondary | ICD-10-CM | POA: Diagnosis not present

## 2019-06-21 DIAGNOSIS — Z5111 Encounter for antineoplastic chemotherapy: Secondary | ICD-10-CM | POA: Diagnosis not present

## 2019-06-21 DIAGNOSIS — N6312 Unspecified lump in the right breast, upper inner quadrant: Secondary | ICD-10-CM | POA: Diagnosis not present

## 2019-06-21 DIAGNOSIS — M7989 Other specified soft tissue disorders: Secondary | ICD-10-CM | POA: Diagnosis not present

## 2019-06-21 DIAGNOSIS — T451X5A Adverse effect of antineoplastic and immunosuppressive drugs, initial encounter: Secondary | ICD-10-CM | POA: Diagnosis not present

## 2019-06-21 DIAGNOSIS — R7989 Other specified abnormal findings of blood chemistry: Secondary | ICD-10-CM | POA: Diagnosis not present

## 2019-06-21 DIAGNOSIS — Z95828 Presence of other vascular implants and grafts: Secondary | ICD-10-CM

## 2019-06-21 DIAGNOSIS — Z5189 Encounter for other specified aftercare: Secondary | ICD-10-CM | POA: Diagnosis not present

## 2019-06-21 DIAGNOSIS — N6342 Unspecified lump in left breast, subareolar: Secondary | ICD-10-CM | POA: Diagnosis not present

## 2019-06-21 MED ORDER — SODIUM CHLORIDE 0.9% IV SOLUTION
250.0000 mL | Freq: Once | INTRAVENOUS | Status: AC
  Administered 2019-06-21: 250 mL via INTRAVENOUS
  Filled 2019-06-21: qty 250

## 2019-06-21 MED ORDER — SODIUM CHLORIDE 0.9% FLUSH
10.0000 mL | Freq: Once | INTRAVENOUS | Status: AC
  Administered 2019-06-21: 10 mL
  Filled 2019-06-21: qty 10

## 2019-06-21 MED ORDER — ACETAMINOPHEN 325 MG PO TABS
650.0000 mg | ORAL_TABLET | Freq: Once | ORAL | Status: AC
  Administered 2019-06-21: 650 mg via ORAL

## 2019-06-21 MED ORDER — ACETAMINOPHEN 325 MG PO TABS
ORAL_TABLET | ORAL | Status: AC
  Filled 2019-06-21: qty 2

## 2019-06-21 MED ORDER — DIPHENHYDRAMINE HCL 25 MG PO CAPS
25.0000 mg | ORAL_CAPSULE | Freq: Once | ORAL | Status: AC
  Administered 2019-06-21: 25 mg via ORAL

## 2019-06-21 MED ORDER — HEPARIN SOD (PORK) LOCK FLUSH 100 UNIT/ML IV SOLN
250.0000 [IU] | INTRAVENOUS | Status: AC | PRN
  Administered 2019-06-21: 13:00:00 500 [IU]
  Filled 2019-06-21: qty 5

## 2019-06-21 MED ORDER — GADOBUTROL 1 MMOL/ML IV SOLN: 9.0000 mL | Freq: Once | INTRAVENOUS | Status: DC | PRN

## 2019-06-21 MED ORDER — DIPHENHYDRAMINE HCL 25 MG PO CAPS
ORAL_CAPSULE | ORAL | Status: AC
  Filled 2019-06-21: qty 1

## 2019-06-21 NOTE — Patient Instructions (Signed)

## 2019-06-21 NOTE — Progress Notes (Signed)
[facility] Progress Note   Elizabeth Sharp [id number] 1965/12/27 54 y.o.  Elizabeth Sharp is managed by Dr. Nicholas Sharp  Actively treated with chemotherapy/immunotherapy/hormonal therapy: yes  Current therapy: Cytoxan and Taxotere  Last treated: 06/09/2019 (cycle #2, day #1)  Next scheduled appointment with provider: 07/01/2019  Assessment: Plan:    Malignant neoplasm of upper-inner quadrant of left breast in female, estrogen receptor positive ([place]) - Plan: heparin lock flush 100 unit/mL  Symptomatic anemia - Plan: Practitioner attestation of consent, Complete patient signature process for consent form, Type and screen, Care order/instruction, Prepare RBC, Prepare RBC, Type and screen, DISCONTINUED: 0.9 %  sodium chloride infusion (Manually program via Guardrails IV Fluids), DISCONTINUED: heparin lock flush 100 unit/mL, DISCONTINUED: acetaminophen (TYLENOL) tablet 650 mg, DISCONTINUED: diphenhydrAMINE (BENADRYL) capsule 25 mg, CANCELED: Transfuse RBC  Menorrhagia with irregular cycle   ER positive malignant neoplasm of the left breast: Elizabeth Sharp continues to be managed by Dr. Lindi Adie and is status post cycle #2, day #1 of Cytoxan and Taxotere which was dosed on 06/09/2019. She is scheduled to be seen next on 07/01/2019.   Symptomatic anemia due to menorrhagia: A CBC returned today with a hemoglobin of 7.8 and a hematocrit of 25.6. The patient will return tomorrow for a transfusion of 1 unit of packed red blood cells. It was discussed with the patient that she might ask Dr. Lindi Adie if it would be appropriate for her to see her gynecologist to inquire if an endometrial ablation might be of benefit.   Please see After Visit Summary for patient specific instructions.  Future Appointments  Date Time Provider [place]  07/01/2019 10:15 AM [facility] LAB 2 [facility] None  07/01/2019 10:30 AM [facility] [place] FLUSH [facility] None  07/01/2019 11:00 AM  Nicholas Lose, MD [facility] None  07/01/2019 12:00 PM [facility] INFUSION [facility] None  07/04/2019 12:45 PM [facility] [place] FLUSH [facility] None  07/22/2019 10:15 AM [facility] LAB 2 [facility] None  07/22/2019 10:30 AM [facility] [place] FLUSH [facility] None  07/22/2019 11:00 AM Nicholas Lose, MD [facility] None  07/22/2019 12:00 PM [facility] INFUSION [facility] None  07/25/2019 12:00 PM [facility] [place] FLUSH [facility] None  08/12/2019 10:00 AM [facility] LAB 5 [facility] None  08/12/2019 10:15 AM [facility] [place] FLUSH [facility] None  08/12/2019 10:45 AM Nicholas Lose, MD [facility] None  08/12/2019 11:45 AM [facility] INFUSION [facility] None  08/15/2019 12:00 PM [facility] [place] FLUSH [facility] None    Orders Placed This Encounter  Procedures  . Practitioner attestation of consent  . Complete patient signature process for consent form  . Care order/instruction  . Type and screen  . Prepare RBC  . ABO/Rh       Subjective:   Patient ID:  Elizabeth Sharp is a 54 y.o. (DOB 1965/08/10) female.  Chief Complaint:  Chief Complaint  Patient presents with  . Menorrhagia    HPI Elizabeth Sharp  Is a 54 y.o. female with a diagnosis of an ER positive malignant neoplasm of the left breast. She is followed by Dr. Lindi Adie and is status post cycle #2, day #1 of Cytoxan and Taxotere which was dosed on 06/09/2019. She has a history of longstanding menorrhagia. She presents today for a CBC as she has had a protracted and heavy menstrual cycle which is ongoing. She also had a heavy nosebleed yesterday. She states that she feels well overall. She denies shortness of breath, chest pain, weakness or fatigue.  Medications: I have reviewed the patient's current medications.  Allergies:  No Known Allergies  Past Medical History:  Diagnosis Date  . Cancer ([place])    L breast cancer    Past Surgical History:  Procedure Laterality Date  . BREAST BIOPSY Bilateral   . PORTACATH PLACEMENT Left  05/17/2019   Procedure: INSERTION PORT-A-CATH WITH POSSIBLE ULTRASOUND;  Surgeon: [name], MD;  Location: [facility];  Service: General;  Laterality: Left;  . TUBAL LIGATION      No family history on file.  Social History   Socioeconomic History  . Marital status: Single    Spouse name: Not on file  . Number of children: Not on file  . Years of education: Not on file  . Highest education level: Not on file  Occupational History  . Not on file  Tobacco Use  . Smoking status: Never Smoker  . Smokeless tobacco: Never Used  Substance and Sexual Activity  . Alcohol use: Yes    Comment: 1 drink once weekly  . Drug use: Never  . Sexual activity: Not on file  Other Topics Concern  . Not on file  Social History Narrative  . Not on file   Social Determinants of Health   Financial Resource Strain:   . Difficulty of Paying Living Expenses: Not on file  Food Insecurity:   . Worried About Charity fundraiser in the Last Year: Not on file  . Ran Out of Food in the Last Year: Not on file  Transportation Needs:   . Lack of Transportation (Medical): Not on file  . Lack of Transportation (Non-Medical): Not on file  Physical Activity:   . Days of Exercise per Week: Not on file  . Minutes of Exercise per Session: Not on file  Stress:   . Feeling of Stress : Not on file  Social Connections:   . Frequency of Communication with Friends and Family: Not on file  . Frequency of Social Gatherings with Friends and Family: Not on file  . Attends Religious Services: Not on file  . Active Member of Clubs or Organizations: Not on file  . Attends Archivist Meetings: Not on file  . Marital Status: Not on file  Intimate Partner Violence:   . Fear of Current or Ex-Partner: Not on file  . Emotionally Abused: Not on file  . Physically Abused: Not on file  . Sexually Abused: Not on file    Past Medical History, Surgical history, Social history, and Family history were reviewed and  updated as appropriate.   Please see review of systems for further details on the patient's review from today.   Review of Systems:  Review of Systems  Constitutional: Negative for appetite change, chills, diaphoresis, fatigue and fever.  HENT: Positive for nosebleeds. Negative for dental problem, mouth sores and trouble swallowing.   Respiratory: Negative for cough, chest tightness and shortness of breath.   Cardiovascular: Negative for chest pain and palpitations.  Gastrointestinal: Negative for anal bleeding, blood in stool, constipation, diarrhea, nausea and vomiting.  Genitourinary: Positive for menstrual problem and vaginal bleeding. Negative for vaginal pain.  Skin: Negative for pallor.  Neurological: Negative for dizziness, syncope, weakness and headaches.    Objective:   Physical Exam:  BP (!) 163/103 (BP Location: Left Arm, Patient Position: Sitting) Comment: Liza RN was notified  Pulse (!) 105 Comment: Liza RN was notified  Temp 98.7 F (37.1 C) (Temporal)   Resp 20   Ht 5\' 9"  (1.753 m)   Wt 207 lb 11.2 oz (94.2 kg)  SpO2 100%   BMI 30.67 kg/m  ECOG: 0  Physical Exam Constitutional:      General: She is not in acute distress.    Appearance: Normal appearance. She is not ill-appearing or toxic-appearing.  HENT:     Head: Normocephalic and atraumatic.  Eyes:     General: No scleral icterus.       Right eye: No discharge.        Left eye: No discharge.  Neurological:     Mental Status: She is alert.     Coordination: Coordination normal.     Gait: Gait normal.  Psychiatric:        Mood and Affect: Mood normal.        Behavior: Behavior normal.        Thought Content: Thought content normal.        Judgment: Judgment normal.     Lab Review:     Component Value Date/Time   NA 140 06/20/2019 1302   K 4.0 06/20/2019 1302   CL 102 06/20/2019 1302   CO2 27 06/20/2019 1302   GLUCOSE 98 06/20/2019 1302   BUN 9 06/20/2019 1302   CREATININE 0.63 06/20/2019  1302   CALCIUM 8.3 (L) 06/20/2019 1302   PROT 6.7 06/20/2019 1302   ALBUMIN 3.3 (L) 06/20/2019 1302   AST 125 (H) 06/20/2019 1302   ALT 102 (H) 06/20/2019 1302   ALKPHOS 136 (H) 06/20/2019 1302   BILITOT 0.5 06/20/2019 1302   GFRNONAA >60 06/20/2019 1302   GFRAA >60 06/20/2019 1302       Component Value Date/Time   WBC 14.1 (H) 06/20/2019 1302   WBC 4.5 05/16/2019 0914   RBC 2.74 (L) 06/20/2019 1302   HGB 7.8 (L) 06/20/2019 1302   HCT 25.6 (L) 06/20/2019 1302   PLT 154 06/20/2019 1302   MCV 93.4 06/20/2019 1302   MCH 28.5 06/20/2019 1302   MCHC 30.5 06/20/2019 1302   RDW 16.2 (H) 06/20/2019 1302   LYMPHSABS 1.6 06/20/2019 1302   MONOABS 1.0 06/20/2019 1302   EOSABS 0.0 06/20/2019 1302   BASOSABS 0.1 06/20/2019 1302   -------------------------------  Imaging from last 24 hours (if applicable):  Radiology interpretation: CT ABDOMEN PELVIS W CONTRAST  Result Date: 05/23/2019 CLINICAL DATA:  Chronic nausea and difficulty eating. Left leg swelling. Recent diagnosis of breast cancer. EXAM: CT ABDOMEN AND PELVIS WITH CONTRAST TECHNIQUE: Multidetector CT imaging of the abdomen and pelvis was performed using the standard protocol following bolus administration of intravenous contrast. CONTRAST:  17mL ISOVUE-300 IOPAMIDOL (ISOVUE-300) INJECTION 61% COMPARISON:  None. FINDINGS: Lower chest: The lung bases are clear of an acute process. No worrisome pulmonary nodules or pleural effusion. The heart is normal in size. No pericardial effusion. Hepatobiliary: A few small low-attenuation lesion are noted. The largest and most distinct measures measuring 9 mm at the hepatic dome. There is also an area of vague infiltrating low-attenuation near the portal region mainly in segment 4B but also in segment 3 and surrounding the left portal vein. I do not see any mass or mass effect. This could be some type of atypical fatty infiltration. I think metastatic disease is unlikely. However, would recommend  MRI abdomen without and with contrast for further evaluation. The gallbladder appears normal.  No common bile duct dilatation. Pancreas: No mass, inflammation or ductal dilatation. Spleen: Normal size.  No focal lesions. Adrenals/Urinary Tract: The adrenal glands and kidneys are unremarkable. No renal, ureteral or bladder calculi or mass. Stomach/Bowel: The stomach, duodenum,  small bowel and colon are unremarkable. No acute inflammatory changes, mass lesions or obstructive findings. The terminal ileum and appendix are normal. There are mild fibrofatty infiltrated type changes involving the cecum and part of the transverse colon which is typically seen with remote inflammatory bowel disease. No acute inflammatory process. Vascular/Lymphatic: There are numerous small scattered retroperitoneal lymph nodes but no mass or overt adenopathy. The aorta and branch vessels are patent. The major venous structures are patent. Reproductive: Enlarged fibroid uterus. Both ovaries are unremarkable. Small cysts are noted. Other: No pelvic lymphadenopathy or significant free pelvic fluid collections. No inguinal mass or adenopathy. The iliac and femoral vascular structures appear normal. No findings for deep venous thrombosis. Musculoskeletal: No significant bony findings. No worrisome bone lesions to suggest metastatic disease. IMPRESSION: 1. A few small indeterminate low-attenuation liver lesions. There is also infiltrating area of low attenuation near the portal region. This could be fatty infiltration. Recommend MRI abdomen without and with contrast for further evaluation. 2. Small scattered retroperitoneal lymph nodes but no abdominal/pelvic lymphadenopathy. 3. Enlarged fibroid uterus and simple appearing ovarian cysts. 4. No worrisome pulmonary nodule at the lung bases or worrisome bony lesions. Electronically Signed   By: Marijo Sanes M.D.   On: 05/23/2019 15:26   MR LIVER W WO CONTRAST  Result Date: 06/06/2019 CLINICAL  DATA:  Evaluate liver lesion EXAM: MRI ABDOMEN WITHOUT AND WITH CONTRAST TECHNIQUE: Multiplanar multisequence MR imaging of the abdomen was performed both before and after the administration of intravenous contrast. CONTRAST:  94mL GADAVIST GADOBUTROL 1 MMOL/ML IV SOLN COMPARISON:  05/23/2019 FINDINGS: Lower chest: No acute findings. Hepatobiliary: There is diffuse hepatic steatosis. Focal area of increased fatty deposition (compared with background liver parenchyma) is identified involving the central left lobe of liver including the lateral and medial segments. This corresponds to the focal areas of low attenuation noted on recent CT compatible with focal fatty infiltration. No suspicious enhancing liver lesions identified. Gallbladder is normal. No biliary ductal dilatation. Pancreas: No mass, inflammatory changes, or other parenchymal abnormality identified. Spleen:  Within normal limits in size and appearance. Adrenals/Urinary Tract: Normal appearance of the adrenal glands. The kidneys are unremarkable. No mass or hydronephrosis. Stomach/Bowel: Visualized portions within the abdomen are unremarkable. Vascular/Lymphatic: No pathologically enlarged lymph nodes identified. No abdominal aortic aneurysm demonstrated. Other:  None. Musculoskeletal: No suspicious bone lesions identified. IMPRESSION: 1. Focal fatty infiltration involving the central left lobe of liver corresponding to the abnormality noted on recent CT. No suspicious liver lesions identified. 2. Diffuse hepatic steatosis. Electronically Signed   By: Kerby Moors M.D.   On: 06/06/2019 17:19   MR BREAST BILATERAL W WO CONTRAST  Result Date: 06/05/2019 CLINICAL DATA:  54 year old female with newly diagnosed left breast cancer with positive metastatic left axillary lymph node. LABS:  None performed today and site. EXAM: BILATERAL BREAST MRI WITH AND WITHOUT CONTRAST TECHNIQUE: Multiplanar, multisequence MR images of both breasts were obtained prior  to and following the intravenous administration of 10 ml of Gadavist. Three-dimensional MR images were rendered by post-processing of the original MR data on an independent workstation. The three-dimensional MR images were interpreted, and findings are reported in the following complete MRI report for this study. Three dimensional images were evaluated at the independent DynaCad workstation COMPARISON:  Previous exam(s). FINDINGS: Breast composition: c. Heterogeneous fibroglandular tissue. Background parenchymal enhancement: Moderate. Right breast: There is an indeterminate 4-5 mm round, enhancing focus in the slightly superior central right breast (series 503, image 156/248). Left breast: Post  biopsy changes are seen in association with a spiculated mildly enhancing mass in the upper inner quadrant of the right breast at anterior depth (series 503, image 122/248). The mass measures 3.8 x 2.4 x 2.8 cm. This is consistent with the patient's site of biopsy proven malignancy. A smaller 1.1 cm spiculated mass is seen immediately deep and adjacent to the index lesion (series 503, image 130/248). This correlates with the sonographic findings as described on diagnostic examination dated 04/21/2019. An additional spiculated mildly enhancing mass is identified in the inferior subareolar aspect (series 503, image 160/248). It measures 1.3 x 1.3 x 1.2 cm. It has mild washout kinetics, similar to the index lesion. The overall distance encompassing all suspicious findings spans greater than 5 cm. Portions of the mass seen to abut the overlying skin with a focal area of mild skin thickening up to 4 mm (series 503, image 130/240). Lymph nodes: Post biopsy changes are seen in the left axilla consistent with a site of biopsy proven metastatic disease. This is seen in association with a lymph node with mild cortical thickening. One additional level 1 left axillary lymph node with borderline cortical thickening is identified. No  suspicious right axillary or internal mammary chain lymphadenopathy. Ancillary findings:  None. IMPRESSION: 1. 3.8 cm mass in the upper outer LEFT breast consistent with the patient's site of biopsy-proven malignancy. A second 1.1 cm mass is seen immediately adjacent and deep to the index lesion (series 503, image 130/248), correlating with sonographic views from 04/21/2019. Both masses measured in total span 4.5 cm in the AP dimension. 2. Suspicious 1.3 cm spiculated mass in the inferior subareolar LEFT breast (series 503, image 160/248), located greater than 5 cm from the index lesion. Recommendation is for MRI guided biopsy. 3. Focal, mild skin thickening overlying the index lesion. 4. Indeterminate 4-5 mm enhancing mass in the superior subareolar RIGHT breast (series 503, image 156/248). Recommendation is for MRI guided biopsy. 5. Post biopsy changes in the left axilla consistent with biopsy proven metastatic disease. Two level 1 left axillary lymph nodes with borderline cortical thickening identified. RECOMMENDATION: 1. Two area MRI guided biopsy to include the suspicious 1.3 cm mass in the inferior subareolar LEFT breast (160/248) and the indeterminate 5 mm mass in the superior subareolar RIGHT breast (156/248). 2. If, following above biopsy results, breast conservation therapy is still a consideration, recommend ultrasound-guided clip placement within the smaller mass adjacent to the index lesion (MRI image 130/248, [place] image 13/21) so that the area can be appropriately localized following systemic therapy. 3. Punch biopsy should be considered at the site of mild, focal skin thickening overlying the index lesion if this will alter clinical management. BI-RADS CATEGORY  4: Suspicious. Electronically Signed   By: Kristopher Oppenheim M.D.   On: 06/05/2019 12:09   MM CLIP PLACEMENT LEFT  Result Date: 06/21/2019 CLINICAL DATA:  Status post MR guided core of needle biopsy a 1.3 cm spiculated mass in the retroareolar  left breast. EXAM: DIAGNOSTIC LEFT MAMMOGRAM POST MRI BIOPSY COMPARISON:  Previous exam(s). FINDINGS: Mammographic images were obtained following MR guided biopsy of the recently demonstrated 1.3 cm retroareolar left breast mass. There are 2 adjacent dumbbell-shaped biopsy marker clips within the biopsy cavity in the retroareolar left breast. IMPRESSION: Appropriate positioning of 2 adjacent dumbbell-shaped biopsy marker clips within the biopsy cavity in the retroareolar left breast. Final Assessment: Post Procedure Mammograms for Marker Placement Electronically Signed   By: Claudie Revering M.D.   On: 06/21/2019 11:48

## 2019-06-21 NOTE — Progress Notes (Signed)
Pt received 1 unit PRBCs today, tolerated well.  VSS.  Denies questions or concerns at time of d/c, verbalized understanding of education on blood transfusion/reaction information.

## 2019-06-22 ENCOUNTER — Encounter: Payer: Self-pay | Admitting: *Deleted

## 2019-06-22 LAB — TYPE AND SCREEN
ABO/RH(D): O NEG
Antibody Screen: NEGATIVE
Unit division: 0

## 2019-06-22 LAB — BPAM RBC
Blood Product Expiration Date: 202101302359
ISSUE DATE / TIME: 202101121053
Unit Type and Rh: 9500

## 2019-06-23 ENCOUNTER — Telehealth: Payer: Self-pay | Admitting: General Surgery

## 2019-06-23 NOTE — Telephone Encounter (Signed)
Left message on machine.

## 2019-06-30 NOTE — Progress Notes (Signed)
Patient Care Team: Darrol Jump, PA-C as PCP - General (Family Medicine) Rockwell Germany, RN as Oncology Nurse Navigator Mauro Kaufmann, RN as Oncology Nurse Navigator  DIAGNOSIS:    ICD-10-CM   1. Malignant neoplasm of upper-inner quadrant of left breast in female, estrogen receptor positive ([place])  C50.212    Z17.0     SUMMARY OF ONCOLOGIC HISTORY: Oncology History  Malignant neoplasm of upper-inner quadrant of left breast in female, estrogen receptor positive ([place])  04/28/2019 Initial Diagnosis   Palpable left breast lump.  Mammogram 04/21/19 showed a 4.5cm mass at the 11:30 position with smaller adjacent solid masses and one abnormal left axillary lymph node. 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   05/10/2019 Cancer Staging   Staging form: Breast, AJCC 8th Edition - Clinical stage from 05/10/2019: Stage IIB (cT2, cN1, cM0, G3, ER+, PR+, HER2-) - Signed by Nicholas Lose, MD on 05/10/2019   05/19/2019 -  Chemotherapy   The patient had palonosetron (ALOXI) injection 0.25 mg, 0.25 mg, Intravenous,  Once, 2 of 4 cycles Administration: 0.25 mg (05/19/2019), 0.25 mg (06/09/2019) pegfilgrastim-jmdb (FULPHILA) injection 6 mg, 6 mg, Subcutaneous,  Once, 2 of 4 cycles Administration: 6 mg (05/20/2019), 6 mg (06/11/2019) cyclophosphamide (CYTOXAN) 1,280 mg in sodium chloride 0.9 % 250 mL chemo infusion, 600 mg/m2 = 1,280 mg, Intravenous,  Once, 2 of 4 cycles Administration: 1,280 mg (05/19/2019), 1,280 mg (06/09/2019) DOCEtaxel (TAXOTERE) 160 mg in sodium chloride 0.9 % 250 mL chemo infusion, 75 mg/m2 = 160 mg, Intravenous,  Once, 2 of 4 cycles Administration: 160 mg (05/19/2019), 160 mg (06/09/2019) fosaprepitant (EMEND) 150 mg in sodium chloride 0.9 % 145 mL IVPB, , Intravenous,  Once, 2 of 4 cycles Administration:  (05/19/2019),  (06/09/2019)  for chemotherapy treatment.      CHIEF  COMPLIANT: Cycle 3Taxotere and Cytoxan  INTERVAL HISTORY: Elizabeth Sharp is a 54 y.o. with above-mentioned history of left breast cancer currently undergoing neoadjuvant chemotherapy with Taxotere and Cytoxan. She was seen in the symptom management clinic on 06/20/19 for heavy menstrual bleeding x1 week and fatigue, and labs showed Hg 7.8 so she received one unit of PRBCs. She presents to the clinic today for cycle 3.   ALLERGIES:  has No Known Allergies.  MEDICATIONS:  Current Outpatient Medications  Medication Sig Dispense Refill  . dexamethasone (DECADRON) 4 MG tablet Take 1 tablet (4 mg total) by mouth daily. Take 1 tablet day before chemo and 1 tablet day after chemo with food 8 tablet 0  . lidocaine-prilocaine (EMLA) cream Apply to affected area once 30 g 3  . LORazepam (ATIVAN) 0.5 MG tablet Take 1 tablet (0.5 mg total) by mouth at bedtime as needed (Nausea or vomiting). 30 tablet 0  . ondansetron (ZOFRAN) 8 MG tablet Take 1 tablet (8 mg total) by mouth 2 (two) times daily as needed for refractory nausea / vomiting. Start on day 3 after chemo. 30 tablet 1  . oxyCODONE (OXY IR/ROXICODONE) 5 MG immediate release tablet Take 1 tablet (5 mg total) by mouth every 6 (six) hours as needed for severe pain. 10 tablet 0  . prochlorperazine (COMPAZINE) 10 MG tablet Take 1 tablet (10 mg total) by mouth every 6 (six) hours as needed (Nausea or vomiting). 30 tablet 1   No current facility-administered medications for this visit.    PHYSICAL EXAMINATION: ECOG PERFORMANCE STATUS: 1 - Symptomatic but  completely ambulatory  There were no vitals filed for this visit. There were no vitals filed for this visit.  LABORATORY DATA:  I have reviewed the data as listed CMP Latest Ref Rng & Units 06/20/2019 06/09/2019 05/27/2019  Glucose 70 - 99 mg/dL 98 113(H) 109(H)  BUN 6 - 20 mg/dL _0 Creatinine 0.44 - 1.00 mg/dL 0.63 0.71 0.71  Sodium 135 - 145 mmol/L 140 140 139  Potassium 3.5 - 5.1 mmol/L  4.0 3.9 4.0  Chloride 98 - 111 mmol/L 102 104 100  CO2 22 - 32 mmol/L _1 Calcium 8.9 - 10.3 mg/dL 8.3(L) 8.4(L) 9.3  Total Protein 6.5 - 8.1 g/dL 6.7 7.1 7.3  Total Bilirubin 0.3 - 1.2 mg/dL 0.5 0.5 0.4  Alkaline Phos 38 - 126 U/L 136(H) 83 113  AST 15 - 41 U/L 125(H) 63(H) 113(H)  ALT 0 - 44 U/L 102(H) 50(H) 78(H)    Lab Results  Component Value Date   WBC 14.1 (H) 06/20/2019   HGB 7.8 (L) 06/20/2019   HCT 25.6 (L) 06/20/2019   MCV 93.4 06/20/2019   PLT 154 06/20/2019   NEUTROABS 9.8 (H) 06/20/2019    ASSESSMENT & PLAN:  Malignant neoplasm of upper-inner quadrant of left breast in female, estrogen receptor positive ([place]) 04/28/2019: Palpable left breast lump, mammogram 4.5 cm mass at 11:30 position and adjacent smaller masses, 1 abnormal left axillary lymph node: Biopsy of breast and axilla both positive for grade 3 IDC ER 95%, PR 90%, Ki-67 70%, HER-2 negative T2N1 stage IIb clinical stage  Recommendation: 1.Neoadjuvant chemotherapywith Taxotere and Cytoxan every 3 weeks x4 2.Breast conserving surgery with targeted node dissection 3.Adjuvant radiation therapy 4.Followed by adjuvant antiestrogen therapy --------------------------------------------------------------------------------------------------------------------------------------------------------- Current Treatment: Taxotere Cytoxan q 3 weeks.Today is cycle 3 CT abdomen pelvis12/14/2020: A few small indeterminate low-attenuation liver lesions. Small scattered retroperitoneal lymph nodes. Enlarged fibroid uterus. MRI abdomen 06/06/2019: Focal fatty infiltration left lobe of the liver no suspicious liver lesions, diffuse hepatic steatosis MRI breast 06/05/2019: 3.8 cm mass biopsy-proven malignancy.  A second 1.1 cm mass, total span 4.5 cm.  Suspicious 1.3 cm spiculated mass inferior subareolar left breast recommended MRI biopsy.  Focal mild skin thickening overlying index lesion.  Indeterminate 5 mm  superior subareolar right breast mass MRI guided biopsy recommended.  To level 1 lymph nodes borderline cortical thickening identified  MRI guided biopsies.  06/20/2018: 6:00 retroareolar: IDC, LOQ: Fibrocystic change with PASH  Chemo toxicities: 1.Mild to moderate fatigue: Stable 2.Slight joint aches: Probably due to growth factor injection 3.  Hair loss: She shaved her head 4.  Lack of taste: Continues to be a problem 5.  Chemotherapy-induced anemia: Blood transfusion 06/23/2019 6.  Elevated LFTs: Monitoring closely.  If it continues to go up then we may have to reduce the dosage of her chemo.  I ordered the breast MRI to be done on 07/25/2019. I sent a message to Dr. Barry Dienes to see her after the MRI.  She can have the port removed during surgery. We will present her in the tumor board as well.  RTC in3 weeks for cycle 4    No orders of the defined types were placed in this encounter.  The patient has a good understanding of the overall plan. she agrees with it. she will call with any problems that may develop before the next visit here.  Total time spent: 30 mins including face to face time and time spent for planning, charting and coordination of care  Nicholas Lose, MD 07/01/2019  Elizabeth Sharp, am acting as scribe for Dr. Nicholas Lose. I have reviewed the above documentation for accuracy and completeness, and I agree with the above.

## 2019-07-01 ENCOUNTER — Inpatient Hospital Stay: Payer: BC Managed Care – PPO

## 2019-07-01 ENCOUNTER — Inpatient Hospital Stay (HOSPITAL_BASED_OUTPATIENT_CLINIC_OR_DEPARTMENT_OTHER): Payer: BC Managed Care – PPO | Admitting: Hematology and Oncology

## 2019-07-01 ENCOUNTER — Other Ambulatory Visit: Payer: Self-pay | Admitting: Hematology and Oncology

## 2019-07-01 ENCOUNTER — Telehealth: Payer: Self-pay | Admitting: Hematology and Oncology

## 2019-07-01 ENCOUNTER — Other Ambulatory Visit: Payer: Self-pay

## 2019-07-01 ENCOUNTER — Encounter: Payer: Self-pay | Admitting: *Deleted

## 2019-07-01 DIAGNOSIS — Z95828 Presence of other vascular implants and grafts: Secondary | ICD-10-CM

## 2019-07-01 DIAGNOSIS — Z5189 Encounter for other specified aftercare: Secondary | ICD-10-CM | POA: Diagnosis not present

## 2019-07-01 DIAGNOSIS — M7989 Other specified soft tissue disorders: Secondary | ICD-10-CM | POA: Diagnosis not present

## 2019-07-01 DIAGNOSIS — C50212 Malignant neoplasm of upper-inner quadrant of left female breast: Secondary | ICD-10-CM

## 2019-07-01 DIAGNOSIS — N920 Excessive and frequent menstruation with regular cycle: Secondary | ICD-10-CM | POA: Diagnosis not present

## 2019-07-01 DIAGNOSIS — C773 Secondary and unspecified malignant neoplasm of axilla and upper limb lymph nodes: Secondary | ICD-10-CM | POA: Diagnosis not present

## 2019-07-01 DIAGNOSIS — Z170 Estrogen receptor positive status [ER+]: Secondary | ICD-10-CM | POA: Diagnosis not present

## 2019-07-01 DIAGNOSIS — D500 Iron deficiency anemia secondary to blood loss (chronic): Secondary | ICD-10-CM | POA: Diagnosis not present

## 2019-07-01 DIAGNOSIS — D6481 Anemia due to antineoplastic chemotherapy: Secondary | ICD-10-CM | POA: Diagnosis not present

## 2019-07-01 DIAGNOSIS — T451X5A Adverse effect of antineoplastic and immunosuppressive drugs, initial encounter: Secondary | ICD-10-CM | POA: Diagnosis not present

## 2019-07-01 DIAGNOSIS — R5383 Other fatigue: Secondary | ICD-10-CM | POA: Diagnosis not present

## 2019-07-01 DIAGNOSIS — R7989 Other specified abnormal findings of blood chemistry: Secondary | ICD-10-CM | POA: Diagnosis not present

## 2019-07-01 DIAGNOSIS — Z5111 Encounter for antineoplastic chemotherapy: Secondary | ICD-10-CM | POA: Diagnosis not present

## 2019-07-01 DIAGNOSIS — R040 Epistaxis: Secondary | ICD-10-CM | POA: Diagnosis not present

## 2019-07-01 DIAGNOSIS — D259 Leiomyoma of uterus, unspecified: Secondary | ICD-10-CM | POA: Diagnosis not present

## 2019-07-01 DIAGNOSIS — R110 Nausea: Secondary | ICD-10-CM | POA: Diagnosis not present

## 2019-07-01 DIAGNOSIS — N6312 Unspecified lump in the right breast, upper inner quadrant: Secondary | ICD-10-CM | POA: Diagnosis not present

## 2019-07-01 LAB — CBC WITH DIFFERENTIAL (CANCER CENTER ONLY)
Abs Immature Granulocytes: 0.02 10*3/uL (ref 0.00–0.07)
Basophils Absolute: 0.1 10*3/uL (ref 0.0–0.1)
Basophils Relative: 1 %
Eosinophils Absolute: 0 10*3/uL (ref 0.0–0.5)
Eosinophils Relative: 0 %
HCT: 29.2 % — ABNORMAL LOW (ref 36.0–46.0)
Hemoglobin: 9 g/dL — ABNORMAL LOW (ref 12.0–15.0)
Immature Granulocytes: 0 %
Lymphocytes Relative: 22 %
Lymphs Abs: 1.3 10*3/uL (ref 0.7–4.0)
MCH: 28.7 pg (ref 26.0–34.0)
MCHC: 30.8 g/dL (ref 30.0–36.0)
MCV: 93 fL (ref 80.0–100.0)
Monocytes Absolute: 0.6 10*3/uL (ref 0.1–1.0)
Monocytes Relative: 11 %
Neutro Abs: 3.9 10*3/uL (ref 1.7–7.7)
Neutrophils Relative %: 66 %
Platelet Count: 305 10*3/uL (ref 150–400)
RBC: 3.14 MIL/uL — ABNORMAL LOW (ref 3.87–5.11)
RDW: 18.3 % — ABNORMAL HIGH (ref 11.5–15.5)
WBC Count: 5.9 10*3/uL (ref 4.0–10.5)
nRBC: 0 % (ref 0.0–0.2)

## 2019-07-01 LAB — CMP (CANCER CENTER ONLY)
ALT: 149 U/L — ABNORMAL HIGH (ref 0–44)
AST: 243 U/L (ref 15–41)
Albumin: 3.8 g/dL (ref 3.5–5.0)
Alkaline Phosphatase: 99 U/L (ref 38–126)
Anion gap: 13 (ref 5–15)
BUN: 15 mg/dL (ref 6–20)
CO2: 25 mmol/L (ref 22–32)
Calcium: 9 mg/dL (ref 8.9–10.3)
Chloride: 104 mmol/L (ref 98–111)
Creatinine: 0.7 mg/dL (ref 0.44–1.00)
GFR, Est AFR Am: >60 mL/min (ref >60)
GFR, Estimated: >60 mL/min (ref >60)
Glucose, Bld: 109 mg/dL — ABNORMAL HIGH (ref 70–99)
Potassium: 3.7 mmol/L (ref 3.5–5.1)
Sodium: 142 mmol/L (ref 135–145)
Total Bilirubin: 1 mg/dL (ref 0.3–1.2)
Total Protein: 7.2 g/dL (ref 6.5–8.1)

## 2019-07-01 MED ORDER — SODIUM CHLORIDE 0.9% FLUSH
10.0000 mL | INTRAVENOUS | Status: DC | PRN
  Administered 2019-07-01: 10 mL via INTRAVENOUS
  Filled 2019-07-01: qty 10

## 2019-07-01 MED ORDER — HEPARIN SOD (PORK) LOCK FLUSH 100 UNIT/ML IV SOLN
500.0000 [IU] | Freq: Once | INTRAVENOUS | Status: AC
  Administered 2019-07-01: 500 [IU] via INTRAVENOUS
  Filled 2019-07-01: qty 5

## 2019-07-01 NOTE — Assessment & Plan Note (Signed)
04/28/2019: Palpable left breast lump, mammogram 4.5 cm mass at 11:30 position and adjacent smaller masses, 1 abnormal left axillary lymph node: Biopsy of breast and axilla both positive for grade 3 IDC ER 95%, PR 90%, Ki-67 70%, HER-2 negative T2N1 stage IIb clinical stage  Recommendation: 1.Neoadjuvant chemotherapywith Taxotere and Cytoxan every 3 weeks x4 2.Breast conserving surgery with targeted node dissection 3.Adjuvant radiation therapy 4.Followed by adjuvant antiestrogen therapy --------------------------------------------------------------------------------------------------------------------------------------------------------- Current Treatment: Taxotere Cytoxan q 3 weeks.Today is cycle 3 CT abdomen pelvis12/14/2020: A few small indeterminate low-attenuation liver lesions. Small scattered retroperitoneal lymph nodes. Enlarged fibroid uterus. MRI abdomen 06/06/2019: Focal fatty infiltration left lobe of the liver no suspicious liver lesions, diffuse hepatic steatosis MRI breast 06/05/2019: 3.8 cm mass biopsy-proven malignancy.  A second 1.1 cm mass, total span 4.5 cm.  Suspicious 1.3 cm spiculated mass inferior subareolar left breast recommended MRI biopsy.  Focal mild skin thickening overlying index lesion.  Indeterminate 5 mm superior subareolar right breast mass MRI guided biopsy recommended.  To level 1 lymph nodes borderline cortical thickening identified  MRI guided biopsies.  06/20/2018: 6:00 retroareolar: IDC, LOQ: Fibrocystic change with PASH  Chemo toxicities: 1.Mild to moderate fatigue: Stable 2.Slight joint aches: Probably due to growth factor injection 3.  Hair loss: She shaved her head 4.  Lack of taste: Continues to be a problem 5.  Chemotherapy-induced anemia: Blood transfusion 06/23/2019 6.  Elevated LFTs: Monitoring closely  Denies any nausea or vomiting.   RTC in3 weeks for cycle 4

## 2019-07-01 NOTE — Progress Notes (Signed)
CRITICAL VALUE ALERT  Critical Value:  AST 243  Date & Time Notied:  06/30/2019 at 11:30 am  Provider Notified: Nicholas Lose, MD  Orders Received/Actions taken: MD verbalized understanding.

## 2019-07-01 NOTE — Telephone Encounter (Signed)
Scheduled appt per 1/22 sch message - pt aware of new appt date and time

## 2019-07-01 NOTE — Progress Notes (Signed)
Patient will not get treated today per Dr. Lindi Adie due to elevated LFT's. Patient is aware that schedulers will contact her for appt's next week for lab and infusion. She is also aware that her injection appt will be cancelled for the 25th and per Dr. Lindi Adie the MRI will be pushed back a week. Patient verbalized understanding.

## 2019-07-04 ENCOUNTER — Inpatient Hospital Stay: Payer: BC Managed Care – PPO

## 2019-07-07 NOTE — Progress Notes (Signed)
Patient Care Team: Darrol Jump, PA-C as PCP - General (Family Medicine) Rockwell Germany, RN as Oncology Nurse Navigator Mauro Kaufmann, RN as Oncology Nurse Navigator  DIAGNOSIS:    ICD-10-CM   1. Malignant neoplasm of upper-inner quadrant of left breast in female, estrogen receptor positive ([place])  C50.212    Z17.0     SUMMARY OF ONCOLOGIC HISTORY: Oncology History  Malignant neoplasm of upper-inner quadrant of left breast in female, estrogen receptor positive ([place])  04/28/2019 Initial Diagnosis   Palpable left breast lump.  Mammogram 04/21/19 showed a 4.5cm mass at the 11:30 position with smaller adjacent solid masses and one abnormal left axillary lymph node. 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   05/10/2019 Cancer Staging   Staging form: Breast, AJCC 8th Edition - Clinical stage from 05/10/2019: Stage IIB (cT2, cN1, cM0, G3, ER+, PR+, HER2-) - Signed by Nicholas Lose, MD on 05/10/2019   05/19/2019 -  Chemotherapy   The patient had palonosetron (ALOXI) injection 0.25 mg, 0.25 mg, Intravenous,  Once, 2 of 4 cycles Administration: 0.25 mg (05/19/2019), 0.25 mg (06/09/2019) pegfilgrastim-jmdb (FULPHILA) injection 6 mg, 6 mg, Subcutaneous,  Once, 2 of 4 cycles Administration: 6 mg (05/20/2019), 6 mg (06/11/2019) cyclophosphamide (CYTOXAN) 1,280 mg in sodium chloride 0.9 % 250 mL chemo infusion, 600 mg/m2 = 1,280 mg, Intravenous,  Once, 2 of 4 cycles Administration: 1,280 mg (05/19/2019), 1,280 mg (06/09/2019) DOCEtaxel (TAXOTERE) 160 mg in sodium chloride 0.9 % 250 mL chemo infusion, 75 mg/m2 = 160 mg, Intravenous,  Once, 2 of 4 cycles Administration: 160 mg (05/19/2019), 160 mg (06/09/2019) fosaprepitant (EMEND) 150 mg in sodium chloride 0.9 % 145 mL IVPB, , Intravenous,  Once, 2 of 4 cycles Administration:  (05/19/2019),  (06/09/2019)  for chemotherapy treatment.      CHIEF  COMPLIANT: Cycle3Taxotere and Cytoxan  INTERVAL HISTORY: Elizabeth Sharp is a 54 y.o. with above-mentioned history of left breast cancer currently undergoing neoadjuvant chemotherapy with Taxotere and Cytoxan. Treatment was held last week due to labs showing AST 243, ALT 149. She presents to the clinic today for cycle 3. Shes feeling a lot better today  ALLERGIES:  has No Known Allergies.  MEDICATIONS:  Current Outpatient Medications  Medication Sig Dispense Refill  . dexamethasone (DECADRON) 4 MG tablet Take 1 tablet (4 mg total) by mouth daily. Take 1 tablet day before chemo and 1 tablet day after chemo with food 8 tablet 0  . lidocaine-prilocaine (EMLA) cream Apply to affected area once 30 g 3  . LORazepam (ATIVAN) 0.5 MG tablet Take 1 tablet (0.5 mg total) by mouth at bedtime as needed (Nausea or vomiting). 30 tablet 0  . ondansetron (ZOFRAN) 8 MG tablet Take 1 tablet (8 mg total) by mouth 2 (two) times daily as needed for refractory nausea / vomiting. Start on day 3 after chemo. 30 tablet 1  . oxyCODONE (OXY IR/ROXICODONE) 5 MG immediate release tablet Take 1 tablet (5 mg total) by mouth every 6 (six) hours as needed for severe pain. 10 tablet 0  . prochlorperazine (COMPAZINE) 10 MG tablet Take 1 tablet (10 mg total) by mouth every 6 (six) hours as needed (Nausea or vomiting). 30 tablet 1   No current facility-administered medications for this visit.    PHYSICAL EXAMINATION: ECOG PERFORMANCE STATUS: 1 - Symptomatic but completely ambulatory  Vitals:   07/08/19 1004  BP: 132/78  Pulse:  92  Resp: 20  Temp: 98.4 F (36.9 C)  SpO2: 99%   Filed Weights   07/08/19 1004  Weight: 199 lb 9.6 oz (90.5 kg)    LABORATORY DATA:  I have reviewed the data as listed CMP Latest Ref Rng & Units 07/08/2019 07/01/2019 06/20/2019  Glucose 70 - 99 mg/dL 90 109(H) 98  BUN 6 - 20 mg/dL '11 15 9  '$ Creatinine 0.44 - 1.00 mg/dL 0.68 0.70 0.63  Sodium 135 - 145 mmol/L 138 142 140  Potassium 3.5  - 5.1 mmol/L 3.6 3.7 4.0  Chloride 98 - 111 mmol/L 104 104 102  CO2 22 - 32 mmol/L '26 25 27  '$ Calcium 8.9 - 10.3 mg/dL 9.4 9.0 8.3(L)  Total Protein 6.5 - 8.1 g/dL 7.2 7.2 6.7  Total Bilirubin 0.3 - 1.2 mg/dL 0.8 1.0 0.5  Alkaline Phos 38 - 126 U/L 101 99 136(H)  AST 15 - 41 U/L 132(H) 243(HH) 125(H)  ALT 0 - 44 U/L 169(H) 149(H) 102(H)    Lab Results  Component Value Date   WBC 4.8 07/08/2019   HGB 9.4 (L) 07/08/2019   HCT 30.4 (L) 07/08/2019   MCV 92.1 07/08/2019   PLT 295 07/08/2019   NEUTROABS 2.6 07/08/2019    ASSESSMENT & PLAN:  Malignant neoplasm of upper-inner quadrant of left breast in female, estrogen receptor positive ([place]) 04/28/2019: Palpable left breast lump, mammogram 4.5 cm mass at 11:30 position and adjacent smaller masses, 1 abnormal left axillary lymph node: Biopsy of breast and axilla both positive for grade 3 IDC ER 95%, PR 90%, Ki-67 70%, HER-2 negative T2N1 stage IIb clinical stage  Recommendation: 1.Neoadjuvant chemotherapywith Taxotere and Cytoxan every 3 weeks x4 2.Breast conserving surgery with targeted node dissection 3.Adjuvant radiation therapy 4.Followed by adjuvant antiestrogen therapy --------------------------------------------------------------------------------------------------------------------------------------------------------- Current Treatment: Taxotere Cytoxan q 3 weeks.Today is cycle3 CT abdomen pelvis12/14/2020: A few small indeterminate low-attenuation liver lesions. Small scattered retroperitoneal lymph nodes. Enlarged fibroid uterus. MRI abdomen 06/06/2019: Focal fatty infiltration left lobe of the liver no suspicious liver lesions, diffuse hepatic steatosis MRI breast 06/05/2019: 3.8 cm mass biopsy-proven malignancy. A second 1.1 cm mass, total span 4.5 cm. Suspicious 1.3 cm spiculated mass inferior subareolar left breast recommended MRI biopsy. Focal mild skin thickening overlying index lesion. Indeterminate 5 mm  superior subareolar right breast mass MRI guided biopsy recommended. To level 1 lymph nodes borderline cortical thickening identified  MRI guided biopsies. 06/20/2018: 6:00 retroareolar: IDC, LOQ: Fibrocystic change with PASH  Chemo toxicities: 1.Mild to moderate fatigue: Slightly worsened 2.Slight joint aches: Due to growth factor injection 3.Hair loss 4.Lack of taste:  Continuing to focus on nutrition 5.Chemotherapy-induced anemia: Blood transfusion 06/23/2019, today's hemoglobin is 9.4 6.Elevated LFTs: Chemo was held last week because of elevated LFTs.  AST 243 and ALT 149. Today AST is 132 and ALT 169 Will reduce chemo dose and proceed  I ordered the breast MRI to be done on 07/25/2019.  Breast MRI date will need to be moved.   RTC in3weeks for cycle 4    No orders of the defined types were placed in this encounter.  The patient has a good understanding of the overall plan. she agrees with it. she will call with any problems that may develop before the next visit here.  Total time spent: 30 mins including face to face time and time spent for planning, charting and coordination of care  Nicholas Lose, MD 07/08/2019  I, Cloyde Reams Dors[place]imer, am acting as scribe for Dr. Nicholas Lose.  I  have reviewed the above documentation for accuracy and completeness, and I agree with the above.

## 2019-07-08 ENCOUNTER — Inpatient Hospital Stay: Payer: BC Managed Care – PPO

## 2019-07-08 ENCOUNTER — Other Ambulatory Visit: Payer: Self-pay

## 2019-07-08 ENCOUNTER — Inpatient Hospital Stay: Payer: BC Managed Care – PPO | Admitting: Hematology and Oncology

## 2019-07-08 DIAGNOSIS — Z5189 Encounter for other specified aftercare: Secondary | ICD-10-CM | POA: Diagnosis not present

## 2019-07-08 DIAGNOSIS — Z170 Estrogen receptor positive status [ER+]: Secondary | ICD-10-CM

## 2019-07-08 DIAGNOSIS — C50212 Malignant neoplasm of upper-inner quadrant of left female breast: Secondary | ICD-10-CM

## 2019-07-08 DIAGNOSIS — C773 Secondary and unspecified malignant neoplasm of axilla and upper limb lymph nodes: Secondary | ICD-10-CM | POA: Diagnosis not present

## 2019-07-08 DIAGNOSIS — R5383 Other fatigue: Secondary | ICD-10-CM | POA: Diagnosis not present

## 2019-07-08 DIAGNOSIS — D6481 Anemia due to antineoplastic chemotherapy: Secondary | ICD-10-CM | POA: Diagnosis not present

## 2019-07-08 DIAGNOSIS — N920 Excessive and frequent menstruation with regular cycle: Secondary | ICD-10-CM | POA: Diagnosis not present

## 2019-07-08 DIAGNOSIS — N6312 Unspecified lump in the right breast, upper inner quadrant: Secondary | ICD-10-CM | POA: Diagnosis not present

## 2019-07-08 DIAGNOSIS — R040 Epistaxis: Secondary | ICD-10-CM | POA: Diagnosis not present

## 2019-07-08 DIAGNOSIS — D500 Iron deficiency anemia secondary to blood loss (chronic): Secondary | ICD-10-CM | POA: Diagnosis not present

## 2019-07-08 DIAGNOSIS — R110 Nausea: Secondary | ICD-10-CM | POA: Diagnosis not present

## 2019-07-08 DIAGNOSIS — M7989 Other specified soft tissue disorders: Secondary | ICD-10-CM | POA: Diagnosis not present

## 2019-07-08 DIAGNOSIS — Z95828 Presence of other vascular implants and grafts: Secondary | ICD-10-CM | POA: Insufficient documentation

## 2019-07-08 DIAGNOSIS — Z5111 Encounter for antineoplastic chemotherapy: Secondary | ICD-10-CM | POA: Diagnosis not present

## 2019-07-08 DIAGNOSIS — D259 Leiomyoma of uterus, unspecified: Secondary | ICD-10-CM | POA: Diagnosis not present

## 2019-07-08 DIAGNOSIS — T451X5A Adverse effect of antineoplastic and immunosuppressive drugs, initial encounter: Secondary | ICD-10-CM | POA: Diagnosis not present

## 2019-07-08 DIAGNOSIS — R7989 Other specified abnormal findings of blood chemistry: Secondary | ICD-10-CM | POA: Diagnosis not present

## 2019-07-08 LAB — CBC WITH DIFFERENTIAL (CANCER CENTER ONLY)
Abs Immature Granulocytes: 0.01 10*3/uL (ref 0.00–0.07)
Basophils Absolute: 0.1 10*3/uL (ref 0.0–0.1)
Basophils Relative: 1 %
Eosinophils Absolute: 0.1 10*3/uL (ref 0.0–0.5)
Eosinophils Relative: 3 %
HCT: 30.4 % — ABNORMAL LOW (ref 36.0–46.0)
Hemoglobin: 9.4 g/dL — ABNORMAL LOW (ref 12.0–15.0)
Immature Granulocytes: 0 %
Lymphocytes Relative: 29 %
Lymphs Abs: 1.4 10*3/uL (ref 0.7–4.0)
MCH: 28.5 pg (ref 26.0–34.0)
MCHC: 30.9 g/dL (ref 30.0–36.0)
MCV: 92.1 fL (ref 80.0–100.0)
Monocytes Absolute: 0.6 10*3/uL (ref 0.1–1.0)
Monocytes Relative: 13 %
Neutro Abs: 2.6 10*3/uL (ref 1.7–7.7)
Neutrophils Relative %: 54 %
Platelet Count: 295 10*3/uL (ref 150–400)
RBC: 3.3 MIL/uL — ABNORMAL LOW (ref 3.87–5.11)
RDW: 17 % — ABNORMAL HIGH (ref 11.5–15.5)
WBC Count: 4.8 10*3/uL (ref 4.0–10.5)
nRBC: 0 % (ref 0.0–0.2)

## 2019-07-08 LAB — CMP (CANCER CENTER ONLY)
ALT: 169 U/L — ABNORMAL HIGH (ref 0–44)
AST: 132 U/L — ABNORMAL HIGH (ref 15–41)
Albumin: 3.8 g/dL (ref 3.5–5.0)
Alkaline Phosphatase: 101 U/L (ref 38–126)
Anion gap: 8 (ref 5–15)
BUN: 11 mg/dL (ref 6–20)
CO2: 26 mmol/L (ref 22–32)
Calcium: 9.4 mg/dL (ref 8.9–10.3)
Chloride: 104 mmol/L (ref 98–111)
Creatinine: 0.68 mg/dL (ref 0.44–1.00)
GFR, Est AFR Am: >60 mL/min (ref >60)
GFR, Estimated: >60 mL/min (ref >60)
Glucose, Bld: 90 mg/dL (ref 70–99)
Potassium: 3.6 mmol/L (ref 3.5–5.1)
Sodium: 138 mmol/L (ref 135–145)
Total Bilirubin: 0.8 mg/dL (ref 0.3–1.2)
Total Protein: 7.2 g/dL (ref 6.5–8.1)

## 2019-07-08 MED ORDER — SODIUM CHLORIDE 0.9% FLUSH
10.0000 mL | Freq: Once | INTRAVENOUS | Status: AC
  Administered 2019-07-08: 10 mL
  Filled 2019-07-08: qty 10

## 2019-07-08 MED ORDER — SODIUM CHLORIDE 0.9 % IV SOLN
Freq: Once | INTRAVENOUS | Status: AC
  Filled 2019-07-08: qty 5

## 2019-07-08 MED ORDER — HEPARIN SOD (PORK) LOCK FLUSH 100 UNIT/ML IV SOLN
500.0000 [IU] | Freq: Once | INTRAVENOUS | Status: AC | PRN
  Administered 2019-07-08: 500 [IU]
  Filled 2019-07-08: qty 5

## 2019-07-08 MED ORDER — PALONOSETRON HCL INJECTION 0.25 MG/5ML
INTRAVENOUS | Status: AC
  Filled 2019-07-08: qty 5

## 2019-07-08 MED ORDER — SODIUM CHLORIDE 0.9 % IV SOLN
400.0000 mg/m2 | Freq: Once | INTRAVENOUS | Status: AC
  Administered 2019-07-08: 840 mg via INTRAVENOUS
  Filled 2019-07-08: qty 42

## 2019-07-08 MED ORDER — DEXAMETHASONE SODIUM PHOSPHATE 10 MG/ML IJ SOLN
INTRAMUSCULAR | Status: AC
  Filled 2019-07-08: qty 1

## 2019-07-08 MED ORDER — PALONOSETRON HCL INJECTION 0.25 MG/5ML
0.2500 mg | Freq: Once | INTRAVENOUS | Status: AC
  Administered 2019-07-08: 0.25 mg via INTRAVENOUS

## 2019-07-08 MED ORDER — DEXAMETHASONE SODIUM PHOSPHATE 10 MG/ML IJ SOLN
10.0000 mg | Freq: Once | INTRAMUSCULAR | Status: AC
  Administered 2019-07-08: 10 mg via INTRAVENOUS

## 2019-07-08 MED ORDER — SODIUM CHLORIDE 0.9 % IV SOLN
Freq: Once | INTRAVENOUS | Status: AC
  Filled 2019-07-08: qty 250

## 2019-07-08 MED ORDER — SODIUM CHLORIDE 0.9 % IV SOLN
50.0000 mg/m2 | Freq: Once | INTRAVENOUS | Status: AC
  Administered 2019-07-08: 110 mg via INTRAVENOUS
  Filled 2019-07-08: qty 11

## 2019-07-08 MED ORDER — SODIUM CHLORIDE 0.9% FLUSH
10.0000 mL | INTRAVENOUS | Status: DC | PRN
  Administered 2019-07-08: 10 mL
  Filled 2019-07-08: qty 10

## 2019-07-08 NOTE — Assessment & Plan Note (Signed)
04/28/2019: Palpable left breast lump, mammogram 4.5 cm mass at 11:30 position and adjacent smaller masses, 1 abnormal left axillary lymph node: Biopsy of breast and axilla both positive for grade 3 IDC ER 95%, PR 90%, Ki-67 70%, HER-2 negative T2N1 stage IIb clinical stage  Recommendation: 1.Neoadjuvant chemotherapywith Taxotere and Cytoxan every 3 weeks x4 2.Breast conserving surgery with targeted node dissection 3.Adjuvant radiation therapy 4.Followed by adjuvant antiestrogen therapy --------------------------------------------------------------------------------------------------------------------------------------------------------- Current Treatment: Taxotere Cytoxan q 3 weeks.Today is cycle3 CT abdomen pelvis12/14/2020: A few small indeterminate low-attenuation liver lesions. Small scattered retroperitoneal lymph nodes. Enlarged fibroid uterus. MRI abdomen 06/06/2019: Focal fatty infiltration left lobe of the liver no suspicious liver lesions, diffuse hepatic steatosis MRI breast 06/05/2019: 3.8 cm mass biopsy-proven malignancy. A second 1.1 cm mass, total span 4.5 cm. Suspicious 1.3 cm spiculated mass inferior subareolar left breast recommended MRI biopsy. Focal mild skin thickening overlying index lesion. Indeterminate 5 mm superior subareolar right breast mass MRI guided biopsy recommended. To level 1 lymph nodes borderline cortical thickening identified  MRI guided biopsies. 06/20/2018: 6:00 retroareolar: IDC, LOQ: Fibrocystic change with PASH  Chemo toxicities: 1.Mild to moderate fatigue: Slightly worsened 2.Slight joint aches: Due to growth factor injection 3.Hair loss 4.Lack of taste:  Continuing to focus on nutrition 5.Chemotherapy-induced anemia: Blood transfusion 06/23/2019, today's hemoglobin is 6.Elevated LFTs: Chemo was held last week because of elevated LFTs.  AST 243 and ALT 149. Awaiting today's blood work to determine if she can proceed  with today's chemo.  I ordered the breast MRI to be done on 07/25/2019.  Breast MRI date will need to be moved.   RTC in3weeks for cycle 4

## 2019-07-08 NOTE — Patient Instructions (Signed)
[facility] Discharge Instructions for Patients Receiving Chemotherapy  Today you received the following chemotherapy agents Docetaxel and Cyclophosphamide.  To help prevent nausea and vomiting after your treatment, we encourage you to take your nausea medication as directed.  If you develop nausea and vomiting that is not controlled by your nausea medication, call the clinic.   BELOW ARE SYMPTOMS THAT SHOULD BE REPORTED IMMEDIATELY:  *FEVER GREATER THAN 100.5 F  *CHILLS WITH OR WITHOUT FEVER  NAUSEA AND VOMITING THAT IS NOT CONTROLLED WITH YOUR NAUSEA MEDICATION  *UNUSUAL SHORTNESS OF BREATH  *UNUSUAL BRUISING OR BLEEDING  TENDERNESS IN MOUTH AND THROAT WITH OR WITHOUT PRESENCE OF ULCERS  *URINARY PROBLEMS  *BOWEL PROBLEMS  UNUSUAL RASH Items with * indicate a potential emergency and should be followed up as soon as possible.  Feel free to call the clinic should you have any questions or concerns. The clinic phone number is [phone].  Please show the [place] at check-in to the Emergency Department and triage nurse.

## 2019-07-08 NOTE — Progress Notes (Signed)
Per MD okay to treat with AST 132 and ALT 169

## 2019-07-11 ENCOUNTER — Telehealth: Payer: Self-pay | Admitting: Hematology and Oncology

## 2019-07-11 ENCOUNTER — Other Ambulatory Visit: Payer: Self-pay

## 2019-07-11 ENCOUNTER — Inpatient Hospital Stay: Payer: BC Managed Care – PPO | Attending: Hematology and Oncology

## 2019-07-11 VITALS — BP 132/72 | HR 78 | Temp 98.2°F | Resp 18

## 2019-07-11 DIAGNOSIS — T451X5A Adverse effect of antineoplastic and immunosuppressive drugs, initial encounter: Secondary | ICD-10-CM | POA: Diagnosis not present

## 2019-07-11 DIAGNOSIS — L659 Nonscarring hair loss, unspecified: Secondary | ICD-10-CM | POA: Insufficient documentation

## 2019-07-11 DIAGNOSIS — Z7952 Long term (current) use of systemic steroids: Secondary | ICD-10-CM | POA: Diagnosis not present

## 2019-07-11 DIAGNOSIS — Z5111 Encounter for antineoplastic chemotherapy: Secondary | ICD-10-CM | POA: Diagnosis not present

## 2019-07-11 DIAGNOSIS — Z170 Estrogen receptor positive status [ER+]: Secondary | ICD-10-CM | POA: Diagnosis not present

## 2019-07-11 DIAGNOSIS — R7989 Other specified abnormal findings of blood chemistry: Secondary | ICD-10-CM | POA: Diagnosis not present

## 2019-07-11 DIAGNOSIS — Z5189 Encounter for other specified aftercare: Secondary | ICD-10-CM | POA: Insufficient documentation

## 2019-07-11 DIAGNOSIS — D6481 Anemia due to antineoplastic chemotherapy: Secondary | ICD-10-CM | POA: Insufficient documentation

## 2019-07-11 DIAGNOSIS — R5383 Other fatigue: Secondary | ICD-10-CM | POA: Diagnosis not present

## 2019-07-11 DIAGNOSIS — M2550 Pain in unspecified joint: Secondary | ICD-10-CM | POA: Insufficient documentation

## 2019-07-11 DIAGNOSIS — Z79899 Other long term (current) drug therapy: Secondary | ICD-10-CM | POA: Diagnosis not present

## 2019-07-11 DIAGNOSIS — R222 Localized swelling, mass and lump, trunk: Secondary | ICD-10-CM | POA: Insufficient documentation

## 2019-07-11 DIAGNOSIS — C50212 Malignant neoplasm of upper-inner quadrant of left female breast: Secondary | ICD-10-CM | POA: Diagnosis not present

## 2019-07-11 MED ORDER — PEGFILGRASTIM-JMDB 6 MG/0.6ML ~~LOC~~ SOSY
6.0000 mg | PREFILLED_SYRINGE | Freq: Once | SUBCUTANEOUS | Status: AC
  Administered 2019-07-11: 6 mg via SUBCUTANEOUS

## 2019-07-11 MED ORDER — PEGFILGRASTIM-JMDB 6 MG/0.6ML ~~LOC~~ SOSY
PREFILLED_SYRINGE | SUBCUTANEOUS | Status: AC
  Filled 2019-07-11: qty 0.6

## 2019-07-11 NOTE — Telephone Encounter (Signed)
I talk with patient regarding schedule  

## 2019-07-11 NOTE — Patient Instructions (Signed)

## 2019-07-21 ENCOUNTER — Telehealth: Payer: Self-pay | Admitting: *Deleted

## 2019-07-21 NOTE — Telephone Encounter (Signed)
Received VM from pt, attempt x1 to return call, no answer, LVM to return call to the office.  

## 2019-07-22 ENCOUNTER — Encounter: Payer: Self-pay | Admitting: *Deleted

## 2019-07-22 ENCOUNTER — Ambulatory Visit: Payer: BC Managed Care – PPO | Admitting: Hematology and Oncology

## 2019-07-22 ENCOUNTER — Other Ambulatory Visit: Payer: BC Managed Care – PPO

## 2019-07-22 ENCOUNTER — Ambulatory Visit: Payer: BC Managed Care – PPO

## 2019-07-25 ENCOUNTER — Ambulatory Visit: Payer: BC Managed Care – PPO

## 2019-07-27 ENCOUNTER — Ambulatory Visit: Payer: BC Managed Care – PPO | Admitting: Hematology and Oncology

## 2019-07-27 ENCOUNTER — Other Ambulatory Visit: Payer: BC Managed Care – PPO

## 2019-07-27 ENCOUNTER — Ambulatory Visit: Payer: BC Managed Care – PPO

## 2019-07-28 ENCOUNTER — Telehealth: Payer: Self-pay | Admitting: Hematology and Oncology

## 2019-07-28 NOTE — Telephone Encounter (Signed)
I talk with patient regarding reschedule due to weather

## 2019-07-28 NOTE — Progress Notes (Signed)
Patient Care Team: Darrol Jump, PA-C as PCP - General (Family Medicine) Rockwell Germany, RN as Oncology Nurse Navigator Mauro Kaufmann, RN as Oncology Nurse Navigator  DIAGNOSIS:    ICD-10-CM   1. Malignant neoplasm of upper-inner quadrant of left breast in female, estrogen receptor positive ([place])  C50.212    Z17.0     SUMMARY OF ONCOLOGIC HISTORY: Oncology History  Malignant neoplasm of upper-inner quadrant of left breast in female, estrogen receptor positive ([place])  04/28/2019 Initial Diagnosis   Palpable left breast lump.  Mammogram 04/21/19 showed a 4.5cm mass at the 11:30 position with smaller adjacent solid masses and one abnormal left axillary lymph node. 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   05/10/2019 Cancer Staging   Staging form: Breast, AJCC 8th Edition - Clinical stage from 05/10/2019: Stage IIB (cT2, cN1, cM0, G3, ER+, PR+, HER2-) - Signed by Nicholas Lose, MD on 05/10/2019   05/19/2019 -  Chemotherapy   The patient had palonosetron (ALOXI) injection 0.25 mg, 0.25 mg, Intravenous,  Once, 3 of 4 cycles Administration: 0.25 mg (05/19/2019), 0.25 mg (06/09/2019), 0.25 mg (07/08/2019) pegfilgrastim-jmdb (FULPHILA) injection 6 mg, 6 mg, Subcutaneous,  Once, 3 of 4 cycles Administration: 6 mg (05/20/2019), 6 mg (06/11/2019), 6 mg (07/11/2019) cyclophosphamide (CYTOXAN) 1,280 mg in sodium chloride 0.9 % 250 mL chemo infusion, 600 mg/m2 = 1,280 mg, Intravenous,  Once, 3 of 4 cycles Dose modification: 400 mg/m2 (original dose 600 mg/m2, Cycle 3, Reason: Dose not tolerated) Administration: 1,280 mg (05/19/2019), 1,280 mg (06/09/2019), 840 mg (07/08/2019) DOCEtaxel (TAXOTERE) 160 mg in sodium chloride 0.9 % 250 mL chemo infusion, 75 mg/m2 = 160 mg, Intravenous,  Once, 3 of 4 cycles Dose modification: 50 mg/m2 (original dose 75 mg/m2, Cycle 3, Reason: Dose not  tolerated) Administration: 160 mg (05/19/2019), 160 mg (06/09/2019), 110 mg (07/08/2019) fosaprepitant (EMEND) 150 mg in sodium chloride 0.9 % 145 mL IVPB, , Intravenous,  Once, 3 of 4 cycles Administration:  (05/19/2019),  (06/09/2019),  (07/08/2019)  for chemotherapy treatment.      CHIEF COMPLIANT: Cycle4Taxotere and Cytoxan (she will be receiving chemo tomorrow)  INTERVAL HISTORY: Elizabeth Sharp is a 54 y.o. with above-mentioned history of left breast cancer currently undergoing neoadjuvant chemotherapy with Taxotere and Cytoxan. She presents to the clinic todayfor cycle 4.  She denies any nausea or vomiting.  Denies any neuropathy symptoms.  ALLERGIES:  has No Known Allergies.  MEDICATIONS:  Current Outpatient Medications  Medication Sig Dispense Refill  . dexamethasone (DECADRON) 4 MG tablet Take 1 tablet (4 mg total) by mouth daily. Take 1 tablet day before chemo and 1 tablet day after chemo with food 8 tablet 0  . lidocaine-prilocaine (EMLA) cream Apply to affected area once 30 g 3  . LORazepam (ATIVAN) 0.5 MG tablet Take 1 tablet (0.5 mg total) by mouth at bedtime as needed (Nausea or vomiting). 30 tablet 0  . ondansetron (ZOFRAN) 8 MG tablet Take 1 tablet (8 mg total) by mouth 2 (two) times daily as needed for refractory nausea / vomiting. Start on day 3 after chemo. 30 tablet 1  . oxyCODONE (OXY IR/ROXICODONE) 5 MG immediate release tablet Take 1 tablet (5 mg total) by mouth every 6 (six) hours as needed for severe pain. 10 tablet 0  . prochlorperazine (COMPAZINE) 10 MG tablet Take 1 tablet (10 mg total) by mouth every 6 (six) hours as needed (  Nausea or vomiting). 30 tablet 1   No current facility-administered medications for this visit.    PHYSICAL EXAMINATION: ECOG PERFORMANCE STATUS: 1 - Symptomatic but completely ambulatory  Vitals:   07/29/19 1151  BP: 140/77  Pulse: 88  Resp: 18  Temp: 97.8 F (36.6 C)  SpO2: 98%   Filed Weights   07/29/19 1151   Weight: 195 lb 4.8 oz (88.6 kg)    LABORATORY DATA:  I have reviewed the data as listed CMP Latest Ref Rng & Units 07/29/2019 07/08/2019 07/01/2019  Glucose 70 - 99 mg/dL 101(H) 90 109(H)  BUN 6 - 20 mg/dL '10 11 15  ' Creatinine 0.44 - 1.00 mg/dL 0.65 0.68 0.70  Sodium 135 - 145 mmol/L 136 138 142  Potassium 3.5 - 5.1 mmol/L 3.8 3.6 3.7  Chloride 98 - 111 mmol/L 104 104 104  CO2 22 - 32 mmol/L '23 26 25  ' Calcium 8.9 - 10.3 mg/dL 9.1 9.4 9.0  Total Protein 6.5 - 8.1 g/dL 7.4 7.2 7.2  Total Bilirubin 0.3 - 1.2 mg/dL 0.6 0.8 1.0  Alkaline Phos 38 - 126 U/L 101 101 99  AST 15 - 41 U/L 122(H) 132(H) 243(HH)  ALT 0 - 44 U/L 85(H) 169(H) 149(H)    Lab Results  Component Value Date   WBC 5.8 07/29/2019   HGB 9.5 (L) 07/29/2019   HCT 30.7 (L) 07/29/2019   MCV 92.2 07/29/2019   PLT 339 07/29/2019   NEUTROABS 3.8 07/29/2019    ASSESSMENT & PLAN:  Malignant neoplasm of upper-inner quadrant of left breast in female, estrogen receptor positive ([place]) 04/28/2019: Palpable left breast lump, mammogram 4.5 cm mass at 11:30 position and adjacent smaller masses, 1 abnormal left axillary lymph node: Biopsy of breast and axilla both positive for grade 3 IDC ER 95%, PR 90%, Ki-67 70%, HER-2 negative T2N1 stage IIb clinical stage  Recommendation: 1.Neoadjuvant chemotherapywith Taxotere and Cytoxan every 3 weeks x4 2.Breast conserving surgery with targeted node dissection 3.Adjuvant radiation therapy 4.Followed by adjuvant antiestrogen therapy --------------------------------------------------------------------------------------------------------------------------------------------------------- Current Treatment: Taxotere Cytoxan q 3 weeks. Tomorrow will becycle4 CT abdomen pelvis12/14/2020: A few small indeterminate low-attenuation liver lesions. Small scattered retroperitoneal lymph nodes. Enlarged fibroid uterus. MRI abdomen 06/06/2019: Focal fatty infiltration left lobe of the liver  no suspicious liver lesions, diffuse hepatic steatosis MRI breast 06/05/2019: 3.8 cm mass biopsy-proven malignancy. A second 1.1 cm mass, total span 4.5 cm. Suspicious 1.3 cm spiculated mass inferior subareolar left breast recommended MRI biopsy. Focal mild skin thickening overlying index lesion. Indeterminate 5 mm superior subareolar right breast mass MRI guided biopsy recommended. To level 1 lymph nodes borderline cortical thickening identified  MRI guided biopsies. 06/20/2018: 6:00 retroareolar: IDC, LOQ: Fibrocystic change withPASH  Chemo toxicities: 1.Mild to moderate fatigue:  Monitoring 2.Slight joint aches: Due to growth factor injection 3.Hair loss 4.Lack of taste:  Trying to eat better 5.Chemotherapy-induced anemia:Blood transfusion 06/23/2019, today's hemoglobin is 6.Elevated LFTs:  Chemo was held briefly for elevated LFTs.  Resumed at a lower dosage.  Breast MRI scheduled for 08/04/2019. MyChart virtual visit after to discuss results  Patient has appointment to see her surgeon after that.   No orders of the defined types were placed in this encounter.  The patient has a good understanding of the overall plan. she agrees with it. she will call with any problems that may develop before the next visit here.  Total time spent: 30 mins including face to face time and time spent for planning, charting and coordination of care  Nicholas Lose, MD 07/29/2019  Julious Oka Dorshimer, am acting as scribe for Dr. Nicholas Lose.  I have reviewed the above documentation for accuracy and completeness, and I agree with the above.

## 2019-07-29 ENCOUNTER — Inpatient Hospital Stay: Payer: BC Managed Care – PPO

## 2019-07-29 ENCOUNTER — Encounter: Payer: Self-pay | Admitting: *Deleted

## 2019-07-29 ENCOUNTER — Other Ambulatory Visit: Payer: Self-pay

## 2019-07-29 ENCOUNTER — Inpatient Hospital Stay: Payer: BC Managed Care – PPO | Admitting: Hematology and Oncology

## 2019-07-29 ENCOUNTER — Inpatient Hospital Stay (HOSPITAL_BASED_OUTPATIENT_CLINIC_OR_DEPARTMENT_OTHER): Payer: BC Managed Care – PPO | Admitting: Hematology and Oncology

## 2019-07-29 DIAGNOSIS — D6481 Anemia due to antineoplastic chemotherapy: Secondary | ICD-10-CM | POA: Diagnosis not present

## 2019-07-29 DIAGNOSIS — Z5111 Encounter for antineoplastic chemotherapy: Secondary | ICD-10-CM | POA: Diagnosis not present

## 2019-07-29 DIAGNOSIS — Z170 Estrogen receptor positive status [ER+]: Secondary | ICD-10-CM

## 2019-07-29 DIAGNOSIS — L659 Nonscarring hair loss, unspecified: Secondary | ICD-10-CM | POA: Diagnosis not present

## 2019-07-29 DIAGNOSIS — T451X5A Adverse effect of antineoplastic and immunosuppressive drugs, initial encounter: Secondary | ICD-10-CM | POA: Diagnosis not present

## 2019-07-29 DIAGNOSIS — C50212 Malignant neoplasm of upper-inner quadrant of left female breast: Secondary | ICD-10-CM | POA: Diagnosis not present

## 2019-07-29 DIAGNOSIS — Z7952 Long term (current) use of systemic steroids: Secondary | ICD-10-CM | POA: Diagnosis not present

## 2019-07-29 DIAGNOSIS — R7989 Other specified abnormal findings of blood chemistry: Secondary | ICD-10-CM | POA: Diagnosis not present

## 2019-07-29 DIAGNOSIS — R5383 Other fatigue: Secondary | ICD-10-CM | POA: Diagnosis not present

## 2019-07-29 DIAGNOSIS — Z79899 Other long term (current) drug therapy: Secondary | ICD-10-CM | POA: Diagnosis not present

## 2019-07-29 DIAGNOSIS — M2550 Pain in unspecified joint: Secondary | ICD-10-CM | POA: Diagnosis not present

## 2019-07-29 DIAGNOSIS — R222 Localized swelling, mass and lump, trunk: Secondary | ICD-10-CM | POA: Diagnosis not present

## 2019-07-29 DIAGNOSIS — Z5189 Encounter for other specified aftercare: Secondary | ICD-10-CM | POA: Diagnosis not present

## 2019-07-29 LAB — CMP (CANCER CENTER ONLY)
ALT: 85 U/L — ABNORMAL HIGH (ref 0–44)
AST: 122 U/L — ABNORMAL HIGH (ref 15–41)
Albumin: 3.7 g/dL (ref 3.5–5.0)
Alkaline Phosphatase: 101 U/L (ref 38–126)
Anion gap: 9 (ref 5–15)
BUN: 10 mg/dL (ref 6–20)
CO2: 23 mmol/L (ref 22–32)
Calcium: 9.1 mg/dL (ref 8.9–10.3)
Chloride: 104 mmol/L (ref 98–111)
Creatinine: 0.65 mg/dL (ref 0.44–1.00)
GFR, Est AFR Am: >60 mL/min (ref >60)
GFR, Estimated: >60 mL/min (ref >60)
Glucose, Bld: 101 mg/dL — ABNORMAL HIGH (ref 70–99)
Potassium: 3.8 mmol/L (ref 3.5–5.1)
Sodium: 136 mmol/L (ref 135–145)
Total Bilirubin: 0.6 mg/dL (ref 0.3–1.2)
Total Protein: 7.4 g/dL (ref 6.5–8.1)

## 2019-07-29 LAB — CBC WITH DIFFERENTIAL (CANCER CENTER ONLY)
Abs Immature Granulocytes: 0.02 10*3/uL (ref 0.00–0.07)
Basophils Absolute: 0.1 10*3/uL (ref 0.0–0.1)
Basophils Relative: 1 %
Eosinophils Absolute: 0 10*3/uL (ref 0.0–0.5)
Eosinophils Relative: 0 %
HCT: 30.7 % — ABNORMAL LOW (ref 36.0–46.0)
Hemoglobin: 9.5 g/dL — ABNORMAL LOW (ref 12.0–15.0)
Immature Granulocytes: 0 %
Lymphocytes Relative: 21 %
Lymphs Abs: 1.2 10*3/uL (ref 0.7–4.0)
MCH: 28.5 pg (ref 26.0–34.0)
MCHC: 30.9 g/dL (ref 30.0–36.0)
MCV: 92.2 fL (ref 80.0–100.0)
Monocytes Absolute: 0.6 10*3/uL (ref 0.1–1.0)
Monocytes Relative: 11 %
Neutro Abs: 3.8 10*3/uL (ref 1.7–7.7)
Neutrophils Relative %: 67 %
Platelet Count: 339 10*3/uL (ref 150–400)
RBC: 3.33 MIL/uL — ABNORMAL LOW (ref 3.87–5.11)
RDW: 19.4 % — ABNORMAL HIGH (ref 11.5–15.5)
WBC Count: 5.8 10*3/uL (ref 4.0–10.5)
nRBC: 0 % (ref 0.0–0.2)

## 2019-07-29 NOTE — Progress Notes (Signed)
Per MD okay to treat on 07/30/19 with AST 122 and ALT 85

## 2019-07-29 NOTE — Assessment & Plan Note (Signed)
04/28/2019: Palpable left breast lump, mammogram 4.5 cm mass at 11:30 position and adjacent smaller masses, 1 abnormal left axillary lymph node: Biopsy of breast and axilla both positive for grade 3 IDC ER 95%, PR 90%, Ki-67 70%, HER-2 negative T2N1 stage IIb clinical stage  Recommendation: 1.Neoadjuvant chemotherapywith Taxotere and Cytoxan every 3 weeks x4 2.Breast conserving surgery with targeted node dissection 3.Adjuvant radiation therapy 4.Followed by adjuvant antiestrogen therapy --------------------------------------------------------------------------------------------------------------------------------------------------------- Current Treatment: Taxotere Cytoxan q 3 weeks.Todayiscycle4 CT abdomen pelvis12/14/2020: A few small indeterminate low-attenuation liver lesions. Small scattered retroperitoneal lymph nodes. Enlarged fibroid uterus. MRI abdomen 06/06/2019: Focal fatty infiltration left lobe of the liver no suspicious liver lesions, diffuse hepatic steatosis MRI breast 06/05/2019: 3.8 cm mass biopsy-proven malignancy. A second 1.1 cm mass, total span 4.5 cm. Suspicious 1.3 cm spiculated mass inferior subareolar left breast recommended MRI biopsy. Focal mild skin thickening overlying index lesion. Indeterminate 5 mm superior subareolar right breast mass MRI guided biopsy recommended. To level 1 lymph nodes borderline cortical thickening identified  MRI guided biopsies. 06/20/2018: 6:00 retroareolar: IDC, LOQ: Fibrocystic change withPASH  Chemo toxicities: 1.Mild to moderate fatigue:  Monitoring 2.Slight joint aches: Due to growth factor injection 3.Hair loss 4.Lack of taste:  Trying to eat better 5.Chemotherapy-induced anemia:Blood transfusion 06/23/2019, today's hemoglobin is 6.Elevated LFTs:  Chemo was held briefly for elevated LFTs.  Resumed at a lower dosage.  Breast MRI scheduled for 08/04/2019. MyChart virtual visit after to discuss  results   RTC in3weeks for cycle4

## 2019-07-29 NOTE — Patient Instructions (Signed)

## 2019-07-30 ENCOUNTER — Other Ambulatory Visit: Payer: Self-pay

## 2019-07-30 ENCOUNTER — Inpatient Hospital Stay: Payer: BC Managed Care – PPO

## 2019-07-30 VITALS — BP 144/88 | HR 100 | Temp 98.5°F

## 2019-07-30 DIAGNOSIS — M2550 Pain in unspecified joint: Secondary | ICD-10-CM | POA: Diagnosis not present

## 2019-07-30 DIAGNOSIS — R222 Localized swelling, mass and lump, trunk: Secondary | ICD-10-CM | POA: Diagnosis not present

## 2019-07-30 DIAGNOSIS — D6481 Anemia due to antineoplastic chemotherapy: Secondary | ICD-10-CM | POA: Diagnosis not present

## 2019-07-30 DIAGNOSIS — C50212 Malignant neoplasm of upper-inner quadrant of left female breast: Secondary | ICD-10-CM | POA: Diagnosis not present

## 2019-07-30 DIAGNOSIS — Z79899 Other long term (current) drug therapy: Secondary | ICD-10-CM | POA: Diagnosis not present

## 2019-07-30 DIAGNOSIS — Z5111 Encounter for antineoplastic chemotherapy: Secondary | ICD-10-CM | POA: Diagnosis not present

## 2019-07-30 DIAGNOSIS — Z170 Estrogen receptor positive status [ER+]: Secondary | ICD-10-CM | POA: Diagnosis not present

## 2019-07-30 DIAGNOSIS — L659 Nonscarring hair loss, unspecified: Secondary | ICD-10-CM | POA: Diagnosis not present

## 2019-07-30 DIAGNOSIS — R5383 Other fatigue: Secondary | ICD-10-CM | POA: Diagnosis not present

## 2019-07-30 DIAGNOSIS — Z7952 Long term (current) use of systemic steroids: Secondary | ICD-10-CM | POA: Diagnosis not present

## 2019-07-30 DIAGNOSIS — R7989 Other specified abnormal findings of blood chemistry: Secondary | ICD-10-CM | POA: Diagnosis not present

## 2019-07-30 DIAGNOSIS — Z5189 Encounter for other specified aftercare: Secondary | ICD-10-CM | POA: Diagnosis not present

## 2019-07-30 DIAGNOSIS — T451X5A Adverse effect of antineoplastic and immunosuppressive drugs, initial encounter: Secondary | ICD-10-CM | POA: Diagnosis not present

## 2019-07-30 MED ORDER — DEXAMETHASONE SODIUM PHOSPHATE 10 MG/ML IJ SOLN
10.0000 mg | Freq: Once | INTRAMUSCULAR | Status: AC
  Administered 2019-07-30: 10 mg via INTRAVENOUS

## 2019-07-30 MED ORDER — PALONOSETRON HCL INJECTION 0.25 MG/5ML
0.2500 mg | Freq: Once | INTRAVENOUS | Status: AC
  Administered 2019-07-30: 08:00:00 0.25 mg via INTRAVENOUS

## 2019-07-30 MED ORDER — HEPARIN SOD (PORK) LOCK FLUSH 100 UNIT/ML IV SOLN
500.0000 [IU] | Freq: Once | INTRAVENOUS | Status: AC | PRN
  Administered 2019-07-30: 500 [IU]
  Filled 2019-07-30: qty 5

## 2019-07-30 MED ORDER — SODIUM CHLORIDE 0.9 % IV SOLN
400.0000 mg/m2 | Freq: Once | INTRAVENOUS | Status: AC
  Administered 2019-07-30: 840 mg via INTRAVENOUS
  Filled 2019-07-30: qty 42

## 2019-07-30 MED ORDER — SODIUM CHLORIDE 0.9 % IV SOLN
Freq: Once | INTRAVENOUS | Status: AC
  Filled 2019-07-30: qty 5

## 2019-07-30 MED ORDER — SODIUM CHLORIDE 0.9 % IV SOLN
Freq: Once | INTRAVENOUS | Status: AC
  Filled 2019-07-30: qty 250

## 2019-07-30 MED ORDER — DEXAMETHASONE SODIUM PHOSPHATE 10 MG/ML IJ SOLN
INTRAMUSCULAR | Status: AC
  Filled 2019-07-30: qty 1

## 2019-07-30 MED ORDER — PALONOSETRON HCL INJECTION 0.25 MG/5ML
INTRAVENOUS | Status: AC
  Filled 2019-07-30: qty 5

## 2019-07-30 MED ORDER — SODIUM CHLORIDE 0.9% FLUSH
10.0000 mL | INTRAVENOUS | Status: DC | PRN
  Administered 2019-07-30: 10 mL
  Filled 2019-07-30: qty 10

## 2019-07-30 MED ORDER — SODIUM CHLORIDE 0.9 % IV SOLN
50.0000 mg/m2 | Freq: Once | INTRAVENOUS | Status: AC
  Administered 2019-07-30: 110 mg via INTRAVENOUS
  Filled 2019-07-30: qty 11

## 2019-07-30 NOTE — Patient Instructions (Signed)
[facility] Discharge Instructions for Patients Receiving Chemotherapy  Today you received the following chemotherapy agents: docetaxel and cyclophosphamide.  To help prevent nausea and vomiting after your treatment, we encourage you to take your nausea medication as directed.   If you develop nausea and vomiting that is not controlled by your nausea medication, call the clinic.   BELOW ARE SYMPTOMS THAT SHOULD BE REPORTED IMMEDIATELY:  *FEVER GREATER THAN 100.5 F  *CHILLS WITH OR WITHOUT FEVER  NAUSEA AND VOMITING THAT IS NOT CONTROLLED WITH YOUR NAUSEA MEDICATION  *UNUSUAL SHORTNESS OF BREATH  *UNUSUAL BRUISING OR BLEEDING  TENDERNESS IN MOUTH AND THROAT WITH OR WITHOUT PRESENCE OF ULCERS  *URINARY PROBLEMS  *BOWEL PROBLEMS  UNUSUAL RASH Items with * indicate a potential emergency and should be followed up as soon as possible.  Feel free to call the clinic should you have any questions or concerns. The clinic phone number is [phone].  Please show the CHEMO ALERT CARD at check-in to the Emergency Department and triage nurse.   

## 2019-08-01 ENCOUNTER — Telehealth: Payer: Self-pay | Admitting: Hematology and Oncology

## 2019-08-01 ENCOUNTER — Other Ambulatory Visit: Payer: Self-pay

## 2019-08-01 ENCOUNTER — Inpatient Hospital Stay: Payer: BC Managed Care – PPO

## 2019-08-01 VITALS — BP 142/79 | HR 87 | Temp 98.2°F | Resp 18

## 2019-08-01 DIAGNOSIS — T451X5A Adverse effect of antineoplastic and immunosuppressive drugs, initial encounter: Secondary | ICD-10-CM | POA: Diagnosis not present

## 2019-08-01 DIAGNOSIS — C50212 Malignant neoplasm of upper-inner quadrant of left female breast: Secondary | ICD-10-CM

## 2019-08-01 DIAGNOSIS — R5383 Other fatigue: Secondary | ICD-10-CM | POA: Diagnosis not present

## 2019-08-01 DIAGNOSIS — R7989 Other specified abnormal findings of blood chemistry: Secondary | ICD-10-CM | POA: Diagnosis not present

## 2019-08-01 DIAGNOSIS — Z7952 Long term (current) use of systemic steroids: Secondary | ICD-10-CM | POA: Diagnosis not present

## 2019-08-01 DIAGNOSIS — Z5189 Encounter for other specified aftercare: Secondary | ICD-10-CM | POA: Diagnosis not present

## 2019-08-01 DIAGNOSIS — D6481 Anemia due to antineoplastic chemotherapy: Secondary | ICD-10-CM | POA: Diagnosis not present

## 2019-08-01 DIAGNOSIS — Z5111 Encounter for antineoplastic chemotherapy: Secondary | ICD-10-CM | POA: Diagnosis not present

## 2019-08-01 DIAGNOSIS — M2550 Pain in unspecified joint: Secondary | ICD-10-CM | POA: Diagnosis not present

## 2019-08-01 DIAGNOSIS — Z170 Estrogen receptor positive status [ER+]: Secondary | ICD-10-CM | POA: Diagnosis not present

## 2019-08-01 DIAGNOSIS — R222 Localized swelling, mass and lump, trunk: Secondary | ICD-10-CM | POA: Diagnosis not present

## 2019-08-01 DIAGNOSIS — Z79899 Other long term (current) drug therapy: Secondary | ICD-10-CM | POA: Diagnosis not present

## 2019-08-01 DIAGNOSIS — L659 Nonscarring hair loss, unspecified: Secondary | ICD-10-CM | POA: Diagnosis not present

## 2019-08-01 MED ORDER — PEGFILGRASTIM-JMDB 6 MG/0.6ML ~~LOC~~ SOSY
6.0000 mg | PREFILLED_SYRINGE | Freq: Once | SUBCUTANEOUS | Status: AC
  Administered 2019-08-01: 6 mg via SUBCUTANEOUS

## 2019-08-01 MED ORDER — PEGFILGRASTIM-JMDB 6 MG/0.6ML ~~LOC~~ SOSY
PREFILLED_SYRINGE | SUBCUTANEOUS | Status: AC
  Filled 2019-08-01: qty 0.6

## 2019-08-01 NOTE — Telephone Encounter (Signed)
I talk with patient regarding video visit °

## 2019-08-01 NOTE — Patient Instructions (Signed)

## 2019-08-04 ENCOUNTER — Ambulatory Visit
Admission: RE | Admit: 2019-08-04 | Discharge: 2019-08-04 | Disposition: A | Discharge status: Home / Self Care | Payer: BC Managed Care – PPO | Source: Ambulatory Visit | Attending: Hematology and Oncology | Admitting: Hematology and Oncology

## 2019-08-04 ENCOUNTER — Telehealth: Payer: Self-pay | Admitting: *Deleted

## 2019-08-04 ENCOUNTER — Other Ambulatory Visit: Payer: Self-pay

## 2019-08-04 DIAGNOSIS — C50212 Malignant neoplasm of upper-inner quadrant of left female breast: Secondary | ICD-10-CM

## 2019-08-04 DIAGNOSIS — C50912 Malignant neoplasm of unspecified site of left female breast: Secondary | ICD-10-CM | POA: Diagnosis not present

## 2019-08-04 MED ORDER — GADOBUTROL 1 MMOL/ML IV SOLN
7.0000 mL | Freq: Once | INTRAVENOUS | Status: AC | PRN
  Administered 2019-08-04: 7 mL via INTRAVENOUS

## 2019-08-04 NOTE — Telephone Encounter (Signed)
Received call from pt stating she is experiencing constipation.  Pt states last BM was several days ago.  Pt requesting advise to alleviate constipation.  Per MD pt to take milk of magnesium as directed on the bottle and use a glycerin suppository if needed to help have a bowel movement.  Pt also educated by RN to increase daily water intake and to take a daily stool softer or miralax if constipation continues to be an issue.  Pt verbalized understanding and will call the office if constipation continues.

## 2019-08-04 NOTE — Progress Notes (Signed)
HEMATOLOGY-ONCOLOGY [facility] VIDEO VISIT PROGRESS NOTE  I connected with Elizabeth Sharp on 08/05/2019 at 11:30 AM EST by MyChart video conference and verified that I am speaking with the correct person using two identifiers.  I discussed the limitations, risks, security and privacy concerns of performing an evaluation and management service by MyChart and the availability of in person appointments.  I also discussed with the patient that there may be a patient responsible charge related to this service. The patient expressed understanding and agreed to proceed.  Patient's Location: Home Physician Location: Clinic  CHIEF COMPLIANT: Follow-up of left breast cancer to review MRI  INTERVAL HISTORY: Elizabeth Sharp is a 54 y.o. female with above-mentioned history of left breast cancer who completed neoadjuvant chemotherapy with Taxotere and Cytoxan.Breast MRI on 08/04/19 showed response to treatment with a decrease in the upper inner left breast mass from 3.8cm previously to 2.5cm, normal appearance of the metastatic left axillary lymph node, and minimal enhancement of the retroareolar left breast mass, previously 1.3cm. She presents over MyChart today to review her MRI.   Oncology History  Malignant neoplasm of upper-inner quadrant of left breast in female, estrogen receptor positive ([place])  04/28/2019 Initial Diagnosis   Palpable left breast lump.  Mammogram 04/21/19 showed a 4.5cm mass at the 11:30 position with smaller adjacent solid masses and one abnormal left axillary lymph node. 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   05/10/2019 Cancer Staging   Staging form: Breast, AJCC 8th Edition - Clinical stage from 05/10/2019: Stage IIB (cT2, cN1, cM0, G3, ER+, PR+, HER2-) - Signed by Nicholas Lose, MD on 05/10/2019   05/19/2019 -  Chemotherapy   The patient had palonosetron (ALOXI) injection  0.25 mg, 0.25 mg, Intravenous,  Once, 4 of 4 cycles Administration: 0.25 mg (05/19/2019), 0.25 mg (06/09/2019), 0.25 mg (07/08/2019), 0.25 mg (07/30/2019) pegfilgrastim-jmdb (FULPHILA) injection 6 mg, 6 mg, Subcutaneous,  Once, 4 of 4 cycles Administration: 6 mg (05/20/2019), 6 mg (06/11/2019), 6 mg (07/11/2019), 6 mg (08/01/2019) cyclophosphamide (CYTOXAN) 1,280 mg in sodium chloride 0.9 % 250 mL chemo infusion, 600 mg/m2 = 1,280 mg, Intravenous,  Once, 4 of 4 cycles Dose modification: 400 mg/m2 (original dose 600 mg/m2, Cycle 3, Reason: Dose not tolerated) Administration: 1,280 mg (05/19/2019), 1,280 mg (06/09/2019), 840 mg (07/08/2019), 840 mg (07/30/2019) DOCEtaxel (TAXOTERE) 160 mg in sodium chloride 0.9 % 250 mL chemo infusion, 75 mg/m2 = 160 mg, Intravenous,  Once, 4 of 4 cycles Dose modification: 50 mg/m2 (original dose 75 mg/m2, Cycle 3, Reason: Dose not tolerated) Administration: 160 mg (05/19/2019), 160 mg (06/09/2019), 110 mg (07/08/2019), 110 mg (07/30/2019) fosaprepitant (EMEND) 150 mg in sodium chloride 0.9 % 145 mL IVPB, , Intravenous,  Once, 4 of 4 cycles Administration:  (05/19/2019),  (06/09/2019),  (07/08/2019),  (07/30/2019)  for chemotherapy treatment.      Observations/Objective:  There were no vitals filed for this visit. There is no height or weight on file to calculate BMI.  I have reviewed the data as listed CMP Latest Ref Rng & Units 07/29/2019 07/08/2019 07/01/2019  Glucose 70 - 99 mg/dL 101(H) 90 109(H)  BUN 6 - 20 mg/dL '10 11 15  ' Creatinine 0.44 - 1.00 mg/dL 0.65 0.68 0.70  Sodium 135 - 145 mmol/L 136 138 142  Potassium 3.5 - 5.1 mmol/L 3.8 3.6 3.7  Chloride 98 - 111 mmol/L 104 104 104  CO2 22 - 32  mmol/L '23 26 25  ' Calcium 8.9 - 10.3 mg/dL 9.1 9.4 9.0  Total Protein 6.5 - 8.1 g/dL 7.4 7.2 7.2  Total Bilirubin 0.3 - 1.2 mg/dL 0.6 0.8 1.0  Alkaline Phos 38 - 126 U/L 101 101 99  AST 15 - 41 U/L 122(H) 132(H) 243(HH)  ALT 0 - 44 U/L 85(H) 169(H) 149(H)    Lab Results    Component Value Date   WBC 5.8 07/29/2019   HGB 9.5 (L) 07/29/2019   HCT 30.7 (L) 07/29/2019   MCV 92.2 07/29/2019   PLT 339 07/29/2019   NEUTROABS 3.8 07/29/2019      Assessment Plan:  Malignant neoplasm of upper-inner quadrant of left breast in female, estrogen receptor positive ([place]) 04/28/2019: Palpable left breast lump, mammogram 4.5 cm mass at 11:30 position and adjacent smaller masses, 1 abnormal left axillary lymph node: Biopsy of breast and axilla both positive for grade 3 IDC ER 95%, PR 90%, Ki-67 70%, HER-2 negative T2N1 stage IIb clinical stage  Recommendation: 1.Neoadjuvant chemotherapywith Taxotere and Cytoxan every 3 weeks x4  2.Breast conserving surgery with targeted node dissection 3.Adjuvant radiation therapy 4.Followed by adjuvant antiestrogen therapy --------------------------------------------------------------------------------------------------------------------------------------------------------- Post neoadjuvant breast MRI 08/05/2019: Left breast cancer previously 3.8 cm is now 2.5 cm now ill-defined and less confluent.  Retroareolar mass 1.3 cm is now minimal enhancement.  Left axilla lymph node abnormal  Radiology review: I discussed the MRI results with the patient and informed her of the partial response to treatment.  The lymph node has improved which makes it possible for her to do targeted node dissection. She has an appointment to see Dr. Barry Dienes on 08/08/2019 to discuss surgical plan.  She is very hopeful that she can get breast conserving surgery and targeted node dissection.  She does not want to do her mastectomy.  I would like to see her 1 week after surgery to discuss the final pathology report.    I discussed the assessment and treatment plan with the patient. The patient was provided an opportunity to ask questions and all were answered. The patient agreed with the plan and demonstrated an understanding of the instructions. The patient  was advised to call back or seek an in-person evaluation if the symptoms worsen or if the condition fails to improve as anticipated.   I provided 30 minutes of face-to-face MyChart video visit time during this encounter.    Rulon Eisenmenger, MD 08/05/2019   I, Molly Dorshimer, am acting as scribe for Nicholas Lose, MD.  I have reviewed the above documentation for accuracy and completeness, and I agree with the above.

## 2019-08-05 ENCOUNTER — Inpatient Hospital Stay (HOSPITAL_BASED_OUTPATIENT_CLINIC_OR_DEPARTMENT_OTHER): Payer: BC Managed Care – PPO | Admitting: Hematology and Oncology

## 2019-08-05 ENCOUNTER — Encounter: Payer: Self-pay | Admitting: *Deleted

## 2019-08-05 DIAGNOSIS — M2550 Pain in unspecified joint: Secondary | ICD-10-CM | POA: Diagnosis not present

## 2019-08-05 DIAGNOSIS — R7989 Other specified abnormal findings of blood chemistry: Secondary | ICD-10-CM | POA: Diagnosis not present

## 2019-08-05 DIAGNOSIS — Z79899 Other long term (current) drug therapy: Secondary | ICD-10-CM | POA: Diagnosis not present

## 2019-08-05 DIAGNOSIS — Z7952 Long term (current) use of systemic steroids: Secondary | ICD-10-CM | POA: Diagnosis not present

## 2019-08-05 DIAGNOSIS — D6481 Anemia due to antineoplastic chemotherapy: Secondary | ICD-10-CM | POA: Diagnosis not present

## 2019-08-05 DIAGNOSIS — Z5111 Encounter for antineoplastic chemotherapy: Secondary | ICD-10-CM | POA: Diagnosis not present

## 2019-08-05 DIAGNOSIS — Z170 Estrogen receptor positive status [ER+]: Secondary | ICD-10-CM | POA: Diagnosis not present

## 2019-08-05 DIAGNOSIS — C50212 Malignant neoplasm of upper-inner quadrant of left female breast: Secondary | ICD-10-CM

## 2019-08-05 DIAGNOSIS — R5383 Other fatigue: Secondary | ICD-10-CM | POA: Diagnosis not present

## 2019-08-05 DIAGNOSIS — Z5189 Encounter for other specified aftercare: Secondary | ICD-10-CM | POA: Diagnosis not present

## 2019-08-05 DIAGNOSIS — R222 Localized swelling, mass and lump, trunk: Secondary | ICD-10-CM | POA: Diagnosis not present

## 2019-08-05 DIAGNOSIS — L659 Nonscarring hair loss, unspecified: Secondary | ICD-10-CM | POA: Diagnosis not present

## 2019-08-05 DIAGNOSIS — T451X5A Adverse effect of antineoplastic and immunosuppressive drugs, initial encounter: Secondary | ICD-10-CM | POA: Diagnosis not present

## 2019-08-05 NOTE — Assessment & Plan Note (Signed)
04/28/2019: Palpable left breast lump, mammogram 4.5 cm mass at 11:30 position and adjacent smaller masses, 1 abnormal left axillary lymph node: Biopsy of breast and axilla both positive for grade 3 IDC ER 95%, PR 90%, Ki-67 70%, HER-2 negative T2N1 stage IIb clinical stage  Recommendation: 1.Neoadjuvant chemotherapywith Taxotere and Cytoxan every 3 weeks x4  2.Breast conserving surgery with targeted node dissection 3.Adjuvant radiation therapy 4.Followed by adjuvant antiestrogen therapy --------------------------------------------------------------------------------------------------------------------------------------------------------- Post neoadjuvant breast MRI 08/05/2019: Left breast cancer previously 3.8 cm is now 2.5 cm now ill-defined and less confluent.  Retroareolar mass 1.3 cm is now minimal enhancement.  Left axilla lymph node abnormal  Radiology review: I discussed the MRI results with the patient and informed her of the partial response to treatment.  The lymph node has improved which makes it possible for her to do targeted node dissection. She has an appointment to see Dr. Barry Dienes on 08/08/2019 to discuss surgical plan.  She is very hopeful that she can get breast conserving surgery and targeted node dissection.  She does not want to do her mastectomy.  I would like to see her 1 week after surgery to discuss the final pathology report.

## 2019-08-08 ENCOUNTER — Other Ambulatory Visit: Payer: Self-pay | Admitting: General Surgery

## 2019-08-08 DIAGNOSIS — C50812 Malignant neoplasm of overlapping sites of left female breast: Secondary | ICD-10-CM | POA: Diagnosis not present

## 2019-08-10 ENCOUNTER — Other Ambulatory Visit: Payer: Self-pay | Admitting: General Surgery

## 2019-08-10 DIAGNOSIS — C50812 Malignant neoplasm of overlapping sites of left female breast: Secondary | ICD-10-CM

## 2019-08-10 DIAGNOSIS — Z170 Estrogen receptor positive status [ER+]: Secondary | ICD-10-CM

## 2019-08-12 ENCOUNTER — Ambulatory Visit: Payer: BC Managed Care – PPO

## 2019-08-12 ENCOUNTER — Ambulatory Visit: Payer: BC Managed Care – PPO | Admitting: Hematology and Oncology

## 2019-08-12 ENCOUNTER — Other Ambulatory Visit: Payer: BC Managed Care – PPO

## 2019-08-15 ENCOUNTER — Encounter: Payer: Self-pay | Admitting: *Deleted

## 2019-08-15 ENCOUNTER — Ambulatory Visit: Payer: BC Managed Care – PPO

## 2019-08-15 ENCOUNTER — Telehealth: Payer: Self-pay | Admitting: Hematology and Oncology

## 2019-08-15 NOTE — Telephone Encounter (Signed)
Scheduled appt pr 3/8 sch message - unable to reach pt . Left message with appt date and time

## 2019-08-22 ENCOUNTER — Other Ambulatory Visit: Payer: Self-pay

## 2019-08-22 ENCOUNTER — Encounter (HOSPITAL_BASED_OUTPATIENT_CLINIC_OR_DEPARTMENT_OTHER): Payer: Self-pay | Admitting: General Surgery

## 2019-08-23 ENCOUNTER — Other Ambulatory Visit: Payer: Self-pay | Admitting: *Deleted

## 2019-08-23 DIAGNOSIS — C50212 Malignant neoplasm of upper-inner quadrant of left female breast: Secondary | ICD-10-CM

## 2019-08-23 DIAGNOSIS — Z170 Estrogen receptor positive status [ER+]: Secondary | ICD-10-CM

## 2019-08-25 ENCOUNTER — Encounter: Payer: Self-pay | Admitting: *Deleted

## 2019-08-26 ENCOUNTER — Other Ambulatory Visit (HOSPITAL_COMMUNITY)
Admission: RE | Admit: 2019-08-26 | Discharge: 2019-08-26 | Disposition: A | Discharge status: Home / Self Care | Payer: BC Managed Care – PPO | Source: Ambulatory Visit | Attending: General Surgery | Admitting: General Surgery

## 2019-08-26 DIAGNOSIS — Z01812 Encounter for preprocedural laboratory examination: Secondary | ICD-10-CM | POA: Diagnosis not present

## 2019-08-26 DIAGNOSIS — Z20822 Contact with and (suspected) exposure to covid-19: Secondary | ICD-10-CM | POA: Insufficient documentation

## 2019-08-26 LAB — SARS CORONAVIRUS 2 (TAT 6-24 HRS): SARS Coronavirus 2: NEGATIVE

## 2019-08-26 MED ORDER — ENSURE PRE-SURGERY PO LIQD: 296.0000 mL | Freq: Once | ORAL | Status: DC

## 2019-08-26 NOTE — Progress Notes (Addendum)

## 2019-08-29 ENCOUNTER — Ambulatory Visit
Admission: RE | Admit: 2019-08-29 | Discharge: 2019-08-29 | Disposition: A | Discharge status: Home / Self Care | Payer: BC Managed Care – PPO | Source: Ambulatory Visit | Attending: General Surgery | Admitting: General Surgery

## 2019-08-29 ENCOUNTER — Encounter: Payer: Self-pay | Admitting: *Deleted

## 2019-08-29 ENCOUNTER — Other Ambulatory Visit: Payer: Self-pay

## 2019-08-29 DIAGNOSIS — C50812 Malignant neoplasm of overlapping sites of left female breast: Secondary | ICD-10-CM

## 2019-08-29 DIAGNOSIS — R928 Other abnormal and inconclusive findings on diagnostic imaging of breast: Secondary | ICD-10-CM | POA: Diagnosis not present

## 2019-08-29 DIAGNOSIS — Z170 Estrogen receptor positive status [ER+]: Secondary | ICD-10-CM

## 2019-08-29 NOTE — H&P (Signed)
Elizabeth Sharp Documented: 08/08/2019 3:01 PM Location: [facility] Patient #: [id number] DOB: 1966/03/17 Single / Language: [name] / Race: White Female   History of Present Illness Elizabeth Klein MD; 08/08/2019 5:49 PM) The patient is a 54 year old female who presents for a follow-up for Breast cancer. Pt is a 54 yo F who was referred for consultation by Dr. Shelly Bombard for a diagnosis of left breast cancer 04/2019. She presented with a self detected left breast mass. It was present for around 6 weeks. She had not had mammogram for around 6 years. She had diagnostic imaging which showed a 4.5 cm mass around 11:30 with a few small satellite lesions. There was 1 abnormal lymph node as well. The right breast had an area of calcifications seen. 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 The right breast biopsy was benign and concordant. She had no prior breast biopsies. She had a maternal aunt who had some type of cancer, but she is not sure what type. She had menarche at age 89, menopause in her upper 90s and is a G2P2. She has not had a colonoscopy.    She had an MRI showing an additional lesion in the left breast just benign the nipple. Biopsy of this was similar. She then underwent neoadjuvant chemotherapy which she tolerated well. She didn't miss any work other than days when she got treatment. She has some mild bilateral foot numbness. She did not get sick from the treatment. She had follow up MRI which showed good response to treatment. The upper inner quadrant mass shrunk from 3.8 cm to 2.5 cm. The retroareolar lesion was 1.3 down to nothing visible. The lymph nodes appear normal now. She stopped being able to feel the mass after several treatments.   MR 08/04/2019 IMPRESSION: 1. Treatment response as follows: Biopsy-proven UPPER INNER LEFT breast malignancy now ill-defined  and much less confluent measuring 2.5 x 1.5 x 1.5 cm, previously 3.8 x 2.4 x 2.8 cm. Biopsy-proven RETROAREOLAR LEFT breast malignancy now exhibiting minimal ill-defined enhancement, previously 1.3 cm and masslike. Biopsy-proven metastasis within LEFT axillary lymph node, which now has a normal appearance. 2. No MR evidence of abnormal appearing lymph nodes or RIGHT breast malignancy.    Allergies (April Staton, CMA; 08/08/2019 3:02 PM) No Known Drug Allergies [05/09/2019]:  Medication History (April Staton, CMA; 08/08/2019 3:04 PM) Dexamethasone (4MG  Tablet, Oral) Active. Lidocaine-Prilocaine (2.5-2.5% Cream, External) Active. LORazepam (0.5MG  Tablet, Oral) Active. Ondansetron HCl (8MG  Tablet, Oral) Active. oxyCODONE HCl (5MG  Tablet, Oral) Active. Prochlorperazine Maleate (10MG  Tablet, Oral) Active. Medications Reconciled  Vitals (April Staton CMA; 08/08/2019 3:04 PM) 08/08/2019 3:04 PM Weight: 192 lb Height: 68in Body Surface Area: 2.01 m Body Mass Index: 29.19 kg/m  Temp.: 98.50F(Tympanic)  Pulse: 97 (Regular)  P.OX: 97% (Room air) BP: 148/82 (Sitting, Left Arm, Standard)       Physical Exam Elizabeth Klein MD; 08/08/2019 5:50 PM) General Mental Status-Alert. General Appearance-Consistent with stated age. Hydration-Well hydrated. Voice-Normal.  Head and Neck Head-normocephalic, atraumatic with no lesions or palpable masses.  Eye Sclera/Conjunctiva - Bilateral-No scleral icterus.  Chest and Lung Exam Chest and lung exam reveals -quiet, even and easy respiratory effort with no use of accessory muscles. Inspection Chest Wall - Normal. Back - normal.  Breast Note: breasts remain relatively symmetric. no palpable mass. ptotic bilaterally. no LAD palpable. no nipple retraction or nipple discharge. no skin dimpling.  Cardiovascular Cardiovascular examination reveals -normal pedal pulses bilaterally. Note: regular rate and  rhythm  Abdomen Inspection-Inspection Normal. Palpation/Percussion Palpation and Percussion of the abdomen reveal - Soft, Non Tender, No Rebound tenderness, No Rigidity (guarding) and No hepatosplenomegaly.  Peripheral Vascular Upper Extremity Inspection - Bilateral - Normal - No Clubbing, No Cyanosis, No Edema, Pulses Intact. Lower Extremity Palpation - Edema - Bilateral - No edema - Bilateral.  Neurologic Neurologic evaluation reveals -alert and oriented x 3 with no impairment of recent or remote memory. Mental Status-Normal.  Musculoskeletal Global Assessment -Note: no gross deformities.  Normal Exam - Left-Upper Extremity Strength Normal and Lower Extremity Strength Normal. Normal Exam - Right-Upper Extremity Strength Normal and Lower Extremity Strength Normal.  Lymphatic Head & Neck  General Head & Neck Lymphatics: Bilateral - Description - Normal. Axillary  General Axillary Region: Bilateral - Description - Normal. Tenderness - Non Tender.    Assessment & Plan Elizabeth Klein MD; 08/08/2019 5:53 PM) CANCER OF OVERLAPPING SITES OF LEFT BREAST (C50.812) Impression: Will set up seed localized lumpectomy x 2. I don't think these areas will be confluent, but we will still use one incision. The larger lesion is upper inner, the smaller lesion is retroareolar.  Additionally, she will need attempt at seed in lymph node. At her initial presentation, it was felt that the clip might have migrated. If that has occurred, we may not be able to target it.  Either way, we will need sentinel lymph node biopsy with dual agents.  I reviewed surgery and risks again wtih patient and daughter. The surgical procedure was described to the patient. I discussed the incision type and location and that we would need radiology involved on with a wire or seed marker and/or sentinel node.  The risks and benefits of the procedure were described to the patient and she wishes to  proceed.  We discussed the risks bleeding, infection, damage to other structures, need for further procedures/surgeries. We discussed the risk of seroma. The patient was advised if the area in the breast in cancer, we may need to go back to surgery for additional tissue to obtain negative margins or for a lymph node biopsy. The patient was advised that these are the most common complications, but that others can occur as well. They were advised against taking aspirin or other anti-inflammatory agents/blood thinners the week before surgery. Current Plans You are being scheduled for surgery- Our schedulers will call you.  You should hear from our office's scheduling department within 5 working days about the location, date, and time of surgery. We try to make accommodations for patient's preferences in scheduling surgery, but sometimes the OR schedule or the surgeon's schedule prevents [place] from making those accommodations.  If you have not heard from our office [phone]) in 5 working days, call the office and ask for your surgeon's nurse.  If you have other questions about your diagnosis, plan, or surgery, call the office and ask for your surgeon's nurse.  Pt Education - flb breast cancer surgery: discussed with patient and provided information.   Signed electronically by Elizabeth Klein, MD (08/08/2019 5:53 PM)

## 2019-08-30 ENCOUNTER — Other Ambulatory Visit: Payer: Self-pay

## 2019-08-30 ENCOUNTER — Ambulatory Visit
Admission: RE | Admit: 2019-08-30 | Discharge: 2019-08-30 | Disposition: A | Discharge status: Home / Self Care | Payer: BC Managed Care – PPO | Source: Ambulatory Visit | Attending: General Surgery | Admitting: General Surgery

## 2019-08-30 ENCOUNTER — Ambulatory Visit (HOSPITAL_COMMUNITY)
Admission: RE | Admit: 2019-08-30 | Discharge: 2019-08-30 | Disposition: A | Discharge status: Home / Self Care | Payer: BC Managed Care – PPO | Source: Ambulatory Visit | Attending: General Surgery | Admitting: General Surgery

## 2019-08-30 ENCOUNTER — Other Ambulatory Visit: Payer: Self-pay | Admitting: Anatomic Pathology & Clinical Pathology

## 2019-08-30 ENCOUNTER — Ambulatory Visit (HOSPITAL_BASED_OUTPATIENT_CLINIC_OR_DEPARTMENT_OTHER): Payer: BC Managed Care – PPO | Admitting: Anesthesiology

## 2019-08-30 ENCOUNTER — Encounter (HOSPITAL_BASED_OUTPATIENT_CLINIC_OR_DEPARTMENT_OTHER)
Admission: RE | Disposition: A | Discharge status: Home / Self Care | Payer: Self-pay | Source: Home / Self Care | Attending: General Surgery

## 2019-08-30 ENCOUNTER — Encounter (HOSPITAL_BASED_OUTPATIENT_CLINIC_OR_DEPARTMENT_OTHER): Payer: Self-pay | Admitting: General Surgery

## 2019-08-30 ENCOUNTER — Encounter: Payer: Self-pay | Admitting: *Deleted

## 2019-08-30 ENCOUNTER — Ambulatory Visit (HOSPITAL_BASED_OUTPATIENT_CLINIC_OR_DEPARTMENT_OTHER)
Admission: RE | Admit: 2019-08-30 | Discharge: 2019-08-30 | Disposition: A | Discharge status: Home / Self Care | Payer: BC Managed Care – PPO | Attending: General Surgery | Admitting: General Surgery

## 2019-08-30 DIAGNOSIS — C50912 Malignant neoplasm of unspecified site of left female breast: Secondary | ICD-10-CM | POA: Diagnosis not present

## 2019-08-30 DIAGNOSIS — C50812 Malignant neoplasm of overlapping sites of left female breast: Secondary | ICD-10-CM

## 2019-08-30 DIAGNOSIS — C773 Secondary and unspecified malignant neoplasm of axilla and upper limb lymph nodes: Secondary | ICD-10-CM | POA: Diagnosis not present

## 2019-08-30 DIAGNOSIS — C50212 Malignant neoplasm of upper-inner quadrant of left female breast: Secondary | ICD-10-CM | POA: Insufficient documentation

## 2019-08-30 DIAGNOSIS — R590 Localized enlarged lymph nodes: Secondary | ICD-10-CM | POA: Diagnosis not present

## 2019-08-30 DIAGNOSIS — Z170 Estrogen receptor positive status [ER+]: Secondary | ICD-10-CM

## 2019-08-30 DIAGNOSIS — C50412 Malignant neoplasm of upper-outer quadrant of left female breast: Secondary | ICD-10-CM | POA: Diagnosis not present

## 2019-08-30 DIAGNOSIS — R928 Other abnormal and inconclusive findings on diagnostic imaging of breast: Secondary | ICD-10-CM | POA: Diagnosis not present

## 2019-08-30 DIAGNOSIS — G8918 Other acute postprocedural pain: Secondary | ICD-10-CM | POA: Diagnosis not present

## 2019-08-30 HISTORY — PX: BREAST LUMPECTOMY WITH RADIOACTIVE SEED AND SENTINEL LYMPH NODE BIOPSY: SHX6550

## 2019-08-30 LAB — POCT PREGNANCY, URINE: Preg Test, Ur: NEGATIVE

## 2019-08-30 SURGERY — BREAST LUMPECTOMY WITH RADIOACTIVE SEED AND SENTINEL LYMPH NODE BIOPSY
Anesthesia: Regional | Site: Breast | Laterality: Left

## 2019-08-30 MED ORDER — DEXAMETHASONE SODIUM PHOSPHATE 10 MG/ML IJ SOLN
INTRAMUSCULAR | Status: DC | PRN
  Administered 2019-08-30: 10 mg via INTRAVENOUS

## 2019-08-30 MED ORDER — HEPARIN (PORCINE) IN NACL 1000-0.9 UT/500ML-% IV SOLN
INTRAVENOUS | Status: AC
  Filled 2019-08-30: qty 500

## 2019-08-30 MED ORDER — MIDAZOLAM HCL 2 MG/2ML IJ SOLN
1.0000 mg | INTRAMUSCULAR | Status: DC | PRN
  Administered 2019-08-30: 2 mg via INTRAVENOUS

## 2019-08-30 MED ORDER — FENTANYL CITRATE (PF) 100 MCG/2ML IJ SOLN
INTRAMUSCULAR | Status: AC
  Filled 2019-08-30: qty 2

## 2019-08-30 MED ORDER — METHYLENE BLUE 0.5 % INJ SOLN
INTRAVENOUS | Status: AC
  Filled 2019-08-30: qty 20

## 2019-08-30 MED ORDER — ACETAMINOPHEN 500 MG PO TABS
ORAL_TABLET | ORAL | Status: AC
  Filled 2019-08-30: qty 2

## 2019-08-30 MED ORDER — MIDAZOLAM HCL 2 MG/2ML IJ SOLN
INTRAMUSCULAR | Status: AC
  Filled 2019-08-30: qty 2

## 2019-08-30 MED ORDER — ROPIVACAINE HCL 5 MG/ML IJ SOLN
INTRAMUSCULAR | Status: DC | PRN
  Administered 2019-08-30: 30 mL via PERINEURAL

## 2019-08-30 MED ORDER — SCOPOLAMINE 1 MG/3DAYS TD PT72
1.0000 | MEDICATED_PATCH | TRANSDERMAL | Status: DC
  Administered 2019-08-30: 09:00:00 1.5 mg via TRANSDERMAL

## 2019-08-30 MED ORDER — SODIUM CHLORIDE (PF) 0.9 % IJ SOLN
INTRAMUSCULAR | Status: AC
  Filled 2019-08-30: qty 20

## 2019-08-30 MED ORDER — OXYCODONE HCL 5 MG PO TABS: 5.0000 mg | ORAL_TABLET | Freq: Four times a day (QID) | ORAL | 0 refills | Status: DC | PRN

## 2019-08-30 MED ORDER — LIDOCAINE 2% (20 MG/ML) 5 ML SYRINGE
INTRAMUSCULAR | Status: AC
  Filled 2019-08-30: qty 5

## 2019-08-30 MED ORDER — ACETAMINOPHEN 500 MG PO TABS
1000.0000 mg | ORAL_TABLET | ORAL | Status: AC
  Administered 2019-08-30: 09:00:00 1000 mg via ORAL

## 2019-08-30 MED ORDER — HEPARIN SOD (PORK) LOCK FLUSH 100 UNIT/ML IV SOLN
INTRAVENOUS | Status: AC
  Filled 2019-08-30: qty 5

## 2019-08-30 MED ORDER — ONDANSETRON HCL 4 MG/2ML IJ SOLN
INTRAMUSCULAR | Status: DC | PRN
  Administered 2019-08-30: 4 mg via INTRAVENOUS

## 2019-08-30 MED ORDER — PROPOFOL 10 MG/ML IV BOLUS
INTRAVENOUS | Status: DC | PRN
  Administered 2019-08-30: 200 mg via INTRAVENOUS

## 2019-08-30 MED ORDER — FENTANYL CITRATE (PF) 100 MCG/2ML IJ SOLN
50.0000 ug | INTRAMUSCULAR | Status: AC | PRN
  Administered 2019-08-30 (×6): 50 ug via INTRAVENOUS

## 2019-08-30 MED ORDER — FENTANYL CITRATE (PF) 100 MCG/2ML IJ SOLN
25.0000 ug | INTRAMUSCULAR | Status: DC | PRN
  Administered 2019-08-30: 12:00:00 25 ug via INTRAVENOUS

## 2019-08-30 MED ORDER — BUPIVACAINE HCL 0.25 % IJ SOLN
INTRAMUSCULAR | Status: DC | PRN
Start: 2019-08-30 — End: 2019-08-30
  Administered 2019-08-30: 15 mL

## 2019-08-30 MED ORDER — SODIUM CHLORIDE (PF) 0.9 % IJ SOLN
INTRAVENOUS | Status: DC | PRN
  Administered 2019-08-30: 5 mL via INTRAMUSCULAR

## 2019-08-30 MED ORDER — LACTATED RINGERS IV SOLN: INTRAVENOUS | Status: DC

## 2019-08-30 MED ORDER — GABAPENTIN 100 MG PO CAPS
ORAL_CAPSULE | ORAL | Status: AC
  Filled 2019-08-30: qty 1

## 2019-08-30 MED ORDER — SCOPOLAMINE 1 MG/3DAYS TD PT72
MEDICATED_PATCH | TRANSDERMAL | Status: AC
  Filled 2019-08-30: qty 1

## 2019-08-30 MED ORDER — LIDOCAINE-EPINEPHRINE 0.5 %-1:200000 IJ SOLN
INTRAMUSCULAR | Status: DC | PRN
  Administered 2019-08-30: 15 mL

## 2019-08-30 MED ORDER — TECHNETIUM TC 99M SULFUR COLLOID FILTERED
1.0000 | Freq: Once | INTRAVENOUS | Status: AC | PRN
  Administered 2019-08-30: 1 via INTRADERMAL

## 2019-08-30 MED ORDER — CHLORHEXIDINE GLUCONATE CLOTH 2 % EX PADS: 6.0000 | MEDICATED_PAD | Freq: Once | CUTANEOUS | Status: DC

## 2019-08-30 MED ORDER — CEFAZOLIN SODIUM-DEXTROSE 2-4 GM/100ML-% IV SOLN
2.0000 g | INTRAVENOUS | Status: AC
  Administered 2019-08-30: 2 g via INTRAVENOUS

## 2019-08-30 MED ORDER — GABAPENTIN 100 MG PO CAPS
100.0000 mg | ORAL_CAPSULE | ORAL | Status: AC
  Administered 2019-08-30: 09:00:00 100 mg via ORAL

## 2019-08-30 MED ORDER — CEFAZOLIN SODIUM-DEXTROSE 2-4 GM/100ML-% IV SOLN
INTRAVENOUS | Status: AC
  Filled 2019-08-30: qty 100

## 2019-08-30 MED ORDER — DEXAMETHASONE SODIUM PHOSPHATE 10 MG/ML IJ SOLN
INTRAMUSCULAR | Status: DC | PRN
  Administered 2019-08-30: 5 mg

## 2019-08-30 SURGICAL SUPPLY — 57 items
BINDER BREAST XLRG (GAUZE/BANDAGES/DRESSINGS) IMPLANT
BINDER BREAST XXLRG (GAUZE/BANDAGES/DRESSINGS) ×2 IMPLANT
BLADE SURG 10 STRL SS (BLADE) ×3 IMPLANT
BLADE SURG 15 STRL LF DISP TIS (BLADE) ×1 IMPLANT
BLADE SURG 15 STRL SS (BLADE) ×2
BNDG COHESIVE 4X5 TAN STRL (GAUZE/BANDAGES/DRESSINGS) ×3 IMPLANT
CANISTER SUCT 1200ML W/VALVE (MISCELLANEOUS) ×3 IMPLANT
CHLORAPREP W/TINT 26 (MISCELLANEOUS) ×3 IMPLANT
CLIP VESOCCLUDE LG 6/CT (CLIP) ×3 IMPLANT
CLIP VESOCCLUDE MED 6/CT (CLIP) ×6 IMPLANT
CLOSURE WOUND 1/2 X4 (GAUZE/BANDAGES/DRESSINGS) ×1
COVER MAYO STAND STRL (DRAPES) ×6 IMPLANT
COVER PROBE W GEL 5X96 (DRAPES) ×3 IMPLANT
DERMABOND ADVANCED (GAUZE/BANDAGES/DRESSINGS) ×2
DERMABOND ADVANCED .7 DNX12 (GAUZE/BANDAGES/DRESSINGS) ×1 IMPLANT
DRAPE UTILITY XL STRL (DRAPES) ×3 IMPLANT
DRSG PAD ABDOMINAL 8X10 ST (GAUZE/BANDAGES/DRESSINGS) ×3 IMPLANT
ELECT COATED BLADE 2.86 ST (ELECTRODE) ×3 IMPLANT
ELECT REM PT RETURN 9FT ADLT (ELECTROSURGICAL) ×3
ELECTRODE REM PT RTRN 9FT ADLT (ELECTROSURGICAL) ×1 IMPLANT
GAUZE SPONGE 4X4 12PLY STRL LF (GAUZE/BANDAGES/DRESSINGS) ×3 IMPLANT
GLOVE BIO SURGEON STRL SZ 6 (GLOVE) ×3 IMPLANT
GLOVE BIO SURGEON STRL SZ 6.5 (GLOVE) ×1 IMPLANT
GLOVE BIO SURGEONS STRL SZ 6.5 (GLOVE) ×1
GLOVE BIOGEL PI IND STRL 6.5 (GLOVE) ×1 IMPLANT
GLOVE BIOGEL PI INDICATOR 6.5 (GLOVE) ×4
GLOVE SURG SS PI 6.5 STRL IVOR (GLOVE) ×2 IMPLANT
GOWN STRL REUS W/ TWL LRG LVL3 (GOWN DISPOSABLE) ×1 IMPLANT
GOWN STRL REUS W/TWL 2XL LVL3 (GOWN DISPOSABLE) ×3 IMPLANT
GOWN STRL REUS W/TWL LRG LVL3 (GOWN DISPOSABLE) ×4
KIT MARKER MARGIN INK (KITS) ×3 IMPLANT
LIGHT WAVEGUIDE WIDE FLAT (MISCELLANEOUS) ×2 IMPLANT
NDL HYPO 25X1 1.5 SAFETY (NEEDLE) ×1 IMPLANT
NDL SAFETY ECLIPSE 18X1.5 (NEEDLE) ×1 IMPLANT
NEEDLE HYPO 18GX1.5 SHARP (NEEDLE) ×2
NEEDLE HYPO 25X1 1.5 SAFETY (NEEDLE) ×6 IMPLANT
NS IRRIG 1000ML POUR BTL (IV SOLUTION) ×3 IMPLANT
PACK BASIN DAY SURGERY FS (CUSTOM PROCEDURE TRAY) ×3 IMPLANT
PACK UNIVERSAL I (CUSTOM PROCEDURE TRAY) ×3 IMPLANT
PENCIL SMOKE EVACUATOR (MISCELLANEOUS) ×3 IMPLANT
SLEEVE SCD COMPRESS KNEE MED (MISCELLANEOUS) ×3 IMPLANT
SPONGE LAP 18X18 RF (DISPOSABLE) ×4 IMPLANT
STOCKINETTE IMPERVIOUS LG (DRAPES) ×3 IMPLANT
STRIP CLOSURE SKIN 1/2X4 (GAUZE/BANDAGES/DRESSINGS) ×2 IMPLANT
SUT MNCRL AB 4-0 PS2 18 (SUTURE) ×5 IMPLANT
SUT VIC AB 2-0 SH 27 (SUTURE) ×2
SUT VIC AB 2-0 SH 27XBRD (SUTURE) ×1 IMPLANT
SUT VIC AB 3-0 SH 27 (SUTURE)
SUT VIC AB 3-0 SH 27X BRD (SUTURE) ×1 IMPLANT
SUT VICRYL 3-0 CR8 SH (SUTURE) ×3 IMPLANT
SYR BULB 3OZ (MISCELLANEOUS) ×3 IMPLANT
SYR CONTROL 10ML LL (SYRINGE) ×5 IMPLANT
TOWEL GREEN STERILE FF (TOWEL DISPOSABLE) ×3 IMPLANT
TRAY FAXITRON CT DISP (TRAY / TRAY PROCEDURE) ×3 IMPLANT
TUBE CONNECTING 20'X1/4 (TUBING) ×1
TUBE CONNECTING 20X1/4 (TUBING) ×2 IMPLANT
YANKAUER SUCT BULB TIP NO VENT (SUCTIONS) ×3 IMPLANT

## 2019-08-30 NOTE — Op Note (Signed)
Left Breast Radioactive seed localized lumpectomy x 2, seed targeted lymph node biopsy, and sentinel lymph node mapping and biopsy  Indications: This patient presents with history of left breast cancer, s/p neoadjuvant chemotherapy, UIQ, cT2N1, grade 3, +/+/-  Pre-operative Diagnosis: left breast cancer  Post-operative Diagnosis: Same  Surgeon: [name]   Anesthesia: General endotracheal anesthesia  ASA Class: 2  Procedure Details  The patient was seen in the Holding Room. The risks, benefits, complications, treatment options, and expected outcomes were discussed with the patient. The possibilities of bleeding, infection, the need for additional procedures, failure to diagnose a condition, and creating a complication requiring transfusion or operation were discussed with the patient. The patient concurred with the proposed plan, giving informed consent.  The site of surgery properly noted/marked. The patient was taken to Operating Room # 8, identified, and the procedure verified as left Breast seed localized Lumpectomy, left seed localized lumpectomy x 2, left seed targeted LN biopsy, left SLN bx. A Time Out was held and the above information confirmed.  The left arm, breast, and chest were prepped and draped in standard fashion. The lumpectomy was performed by creating a lateral circumareolar incision near the previously placed radioactive seeds.  The superior seed was addressed first.  Dissection was carried down around the point of maximum signal intensity. The cautery was used to perform the dissection.  Hemostasis was achieved with cautery. The second seed was then addressed similarly.  The second specimen is contiguous with the first specimen.  The edges of the cavity were marked with large clips, with one each medial, lateral, inferior and superior, and two clips posteriorly.   The specimen was inked with the margin marker paint kit.    Specimen radiography confirmed inclusion of the  mammographic lesion, the clip, and the seed.  The background signal in the breast was zero.  The wound was irrigated and closed with 3-0 vicryl in layers and 4-0 monocryl subcuticular suture.    Using a hand-held gamma probe, left axillary sentinel nodes were identified transcutaneously.  An oblique incision was created below the axillary hairline.  Dissection was carried through the clavipectoral fascia.  The node with the seed was removed first.  The seed in the node was confirmed.  Two deep level two axillary sentinel nodes were removed.  Counts per second are below.    The background count was 0 cps.  The wound was irrigated.  Hemostasis was achieved with cautery.  The axillary incision was closed with a 3-0 vicryl deep dermal interrupted sutures and a 4-0 monocryl subcuticular closure.    Sterile dressings were applied. At the end of the operation, all sponge, instrument, and needle counts were correct.  Findings: grossly clear surgical margins and no adenopathy. The second specimen makes up the inferior margin of the first specimen.  (inferior margin of specimen #1 and superior margin of specimen #2 are the same).  Anterior margin is skin.  The node with the seed did not have good technetium signal.  The first node was palpable and had a tiny amount of signal (15 cps).  The second SLN had good Tc99 signal at 291 cps.  The background signal was 0.    Estimated Blood Loss:  min         Specimens: Left breast lumpectomy x 2, left axillary node with seed, and Two left axillary sentinel lymph nodes.             Complications:  None; patient tolerated the procedure  well.         Disposition: PACU - hemodynamically stable.         Condition: stable

## 2019-08-30 NOTE — Transfer of Care (Signed)
Immediate Anesthesia Transfer of Care Note  Patient: Elizabeth Sharp [facility]  Procedure(s) Performed: LEFT BREAST LUMPECTOMY X 2 WITH RADIOACTIVE SEED AND SEED TARGETED LYMPH NODE BIOPSY, SENTINEL LYMPH NODE BIOPSY (Left Breast)  Patient Location: PACU  Anesthesia Type:General  Level of Consciousness: awake, alert  and oriented  Airway & Oxygen Therapy: Patient Spontanous Breathing and Patient connected to face mask oxygen  Post-op Assessment: Report given to RN and Post -op Vital signs reviewed and stable  Post vital signs: Reviewed and stable  Last Vitals:  Vitals Value Taken Time  BP 130/84 08/30/19 1141  Temp    Pulse 90 08/30/19 1144  Resp 20 08/30/19 1144  SpO2 98 % 08/30/19 1144  Vitals shown include unvalidated device data.  Last Pain:  Vitals:   08/30/19 0856  TempSrc: Tympanic  PainSc: 0-No pain         Complications: No apparent anesthesia complications

## 2019-08-30 NOTE — Interval H&P Note (Signed)
History and Physical Interval Note:  08/30/2019 9:28 AM  Elizabeth Sharp  has presented today for surgery, with the diagnosis of LEFT BREAST CANCER.  The various methods of treatment have been discussed with the patient and family. After consideration of risks, benefits and other options for treatment, the patient has consented to  Procedure(s): LEFT BREAST LUMPECTOMY X 2 WITH RADIOACTIVE SEED AND SEED TARGETED LYMPH NODE BIOPSY, SENTINEL LYMPH NODE BIOPSY (Left) as a surgical intervention.  The patient's history has been reviewed, patient examined, no change in status, stable for surgery.  I have reviewed the patient's chart and labs.  Questions were answered to the patient's satisfaction.     [name]

## 2019-08-30 NOTE — Anesthesia Procedure Notes (Signed)
Procedure Name: LMA Insertion Date/Time: 08/30/2019 10:02 AM Performed by: [place] Kamarius Buckbee C, CRNA Pre-anesthesia Checklist: Patient identified, Emergency Drugs available, Suction available and Patient being monitored Patient Re-evaluated:Patient Re-evaluated prior to induction Oxygen Delivery Method: Circle system utilized Preoxygenation: Pre-oxygenation with 100% oxygen Induction Type: IV induction Ventilation: Mask ventilation without difficulty LMA: LMA inserted LMA Size: 4.0 Number of attempts: 1 Airway Equipment and Method: Bite block Placement Confirmation: positive ETCO2 Tube secured with: Tape Dental Injury: Teeth and Oropharynx as per pre-operative assessment

## 2019-08-30 NOTE — Progress Notes (Signed)
Assisted nuc med tech, with nuc med injections. Side rails up, monitors on throughout procedure. See vital signs in flow sheet. Tolerated Procedure well.

## 2019-08-30 NOTE — Anesthesia Preprocedure Evaluation (Addendum)
Anesthesia Evaluation  Patient identified by MRN, date of birth, ID band Patient awake    Reviewed: Allergy & Precautions, NPO status , Patient's Chart, lab work & pertinent test results  Airway Mallampati: II  TM Distance: >3 FB Neck ROM: Full    Dental no notable dental hx.    Pulmonary neg pulmonary ROS,    Pulmonary exam normal breath sounds clear to auscultation       Cardiovascular negative cardio ROS Normal cardiovascular exam Rhythm:Regular Rate:Normal     Neuro/Psych negative neurological ROS  negative psych ROS   GI/Hepatic negative GI ROS, Neg liver ROS,   Endo/Other  negative endocrine ROS  Renal/GU negative Renal ROS  negative genitourinary   Musculoskeletal negative musculoskeletal ROS (+)   Abdominal   Peds  Hematology  (+) Blood dyscrasia (Hgb 9.5), anemia ,   Anesthesia Other Findings Left breast CA  Reproductive/Obstetrics                            Anesthesia Physical Anesthesia Plan  ASA: II  Anesthesia Plan: General and Regional   Post-op Pain Management:  Regional for Post-op pain   Induction: Intravenous  PONV Risk Score and Plan: 3 and Ondansetron, Dexamethasone and Midazolam  Airway Management Planned: LMA  Additional Equipment:   Intra-op Plan:   Post-operative Plan: Extubation in OR  Informed Consent: I have reviewed the patients History and Physical, chart, labs and discussed the procedure including the risks, benefits and alternatives for the proposed anesthesia with the patient or authorized representative who has indicated his/her understanding and acceptance.     Dental advisory given  Plan Discussed with: CRNA  Anesthesia Plan Comments:         Anesthesia Quick Evaluation

## 2019-08-30 NOTE — Discharge Instructions (Addendum)
Post Anesthesia Home Care Instructions  Activity: Get plenty of rest for the remainder of the day. A responsible individual must stay with you for 24 hours following the procedure.  For the next 24 hours, DO NOT: -Drive a car -Paediatric nurse -Drink alcoholic beverages -Take any medication unless instructed by your physician -Make any legal decisions or sign important papers.  Meals: Start with liquid foods such as gelatin or soup. Progress to regular foods as tolerated. Avoid greasy, spicy, heavy foods. If nausea and/or vomiting occur, drink only clear liquids until the nausea and/or vomiting subsides. Call your physician if vomiting continues.  Special Instructions/Symptoms: Your throat may feel dry or sore from the anesthesia or the breathing tube placed in your throat during surgery. If this causes discomfort, gargle with warm salt water. The discomfort should disappear within 24 hours.  If you had a scopolamine patch placed behind your ear for the management of post- operative nausea and/or vomiting:  1. The medication in the patch is effective for 72 hours, after which it should be removed.  Wrap patch in a tissue and discard in the trash. Wash hands thoroughly with soap and water. 2. You may remove the patch earlier than 72 hours if you experience unpleasant side effects which may include dry mouth, dizziness or visual disturbances. 3. Avoid touching the patch. Wash your hands with soap and water after contact with the patch.       [facility] Phone Number [phone]  BREAST BIOPSY/ PARTIAL MASTECTOMY: POST OP INSTRUCTIONS  Always review your discharge instruction sheet given to you by the facility where your surgery was performed.  IF YOU HAVE DISABILITY OR FAMILY LEAVE FORMS, YOU MUST BRING THEM TO THE OFFICE FOR PROCESSING.  DO NOT GIVE THEM TO YOUR DOCTOR.  1. A prescription for pain medication may be given to you upon discharge.  Take your  pain medication as prescribed, if needed.  If narcotic pain medicine is not needed, then you may take acetaminophen (Tylenol) or ibuprofen (Advil) as needed. 2. Take your usually prescribed medications unless otherwise directed 3. If you need a refill on your pain medication, please contact your pharmacy.  They will contact our office to request authorization.  Prescriptions will not be filled after 5pm or on week-ends. 4. You should eat very light the first 24 hours after surgery, such as soup, crackers, pudding, etc.  Resume your normal diet the day after surgery. 5. Most patients will experience some swelling and bruising in the breast.  Ice packs and a good support bra will help.  Swelling and bruising can take several days to resolve.  6. It is common to experience some constipation if taking pain medication after surgery.  Increasing fluid intake and taking a stool softener will usually help or prevent this problem from occurring.  A mild laxative (Milk of Magnesia or Miralax) should be taken according to package directions if there are no bowel movements after 48 hours. 7. Unless discharge instructions indicate otherwise, you may remove your bandages 48 hours after surgery, and you may shower at that time.  You may have steri-strips (small skin tapes) in place directly over the incision.  These strips should be left on the skin for 7-10 days.   Any sutures or staples will be removed at the office during your follow-up visit. 8. ACTIVITIES:  You may resume regular daily activities (gradually increasing) beginning the next day.  Wearing a good support bra or sports bra (or the  breast binder) minimizes pain and swelling.  You may have sexual intercourse when it is comfortable. a. You may drive when you no longer are taking prescription pain medication, you can comfortably wear a seatbelt, and you can safely maneuver your car and apply brakes. b. RETURN TO WORK:  __________1 week_______________ 9. You  should see your doctor in the office for a follow-up appointment approximately two weeks after your surgery.  Your doctor's nurse will typically make your follow-up appointment when she calls you with your pathology report.  Expect your pathology report 2-3 business days after your surgery.  You may call to check if you do not hear from [place] after three days.   WHEN TO CALL YOUR DOCTOR: 1. Fever over 101.0 2. Nausea and/or vomiting. 3. Extreme swelling or bruising. 4. Continued bleeding from incision. 5. Increased pain, redness, or drainage from the incision.  The clinic staff is available to answer your questions during regular business hours.  Please don't hesitate to call and ask to speak to one of the nurses for clinical concerns.  If you have a medical emergency, go to the nearest emergency room or call 911.  A surgeon from [facility] is always on call at the hospital.  For further questions, please visit [email or web address]

## 2019-08-30 NOTE — Progress Notes (Signed)
   Assisted Dr. Woodrum with left, ultrasound guided, pectoralis block. Side rails up, monitors on throughout procedure. See vital signs in flow sheet. Tolerated Procedure well. 

## 2019-08-30 NOTE — Anesthesia Postprocedure Evaluation (Signed)
Anesthesia Post Note  Patient: Elizabeth Sharp [facility]  Procedure(s) Performed: LEFT BREAST LUMPECTOMY X 2 WITH RADIOACTIVE SEED AND SEED TARGETED LYMPH NODE BIOPSY, SENTINEL LYMPH NODE BIOPSY (Left Breast)     Patient location during evaluation: PACU Anesthesia Type: Regional and General Level of consciousness: awake and alert Pain management: pain level controlled Vital Signs Assessment: post-procedure vital signs reviewed and stable Respiratory status: spontaneous breathing, nonlabored ventilation, respiratory function stable and patient connected to nasal cannula oxygen Cardiovascular status: blood pressure returned to baseline and stable Postop Assessment: no apparent nausea or vomiting Anesthetic complications: no    Last Vitals:  Vitals:   08/30/19 1215 08/30/19 1303  BP: (!) 145/80 (!) 147/95  Pulse: 90 95  Resp: 16 20  Temp:  36.8 C  SpO2: 96% 96%    Last Pain:  Vitals:   08/30/19 1303  TempSrc: Oral  PainSc: 1                  Elizabeth Sharp Elizabeth Sharp

## 2019-08-30 NOTE — Anesthesia Procedure Notes (Signed)
Anesthesia Regional Block: Pectoralis block   Pre-Anesthetic Checklist: ,, timeout performed, Correct Patient, Correct Site, Correct Laterality, Correct Procedure, Correct Position, site marked, Risks and benefits discussed,  Surgical consent,  Pre-op evaluation,  At surgeon's request and post-op pain management  Laterality: Left  Prep: Maximum Sterile Barrier Precautions used, chloraprep       Needles:  Injection technique: Single-shot  Needle Type: Echogenic Stimulator Needle     Needle Length: 9cm  Needle Gauge: 22     Additional Needles:   Procedures:,,,, ultrasound used (permanent image in chart),,,,  Narrative:  Start time: 08/30/2019 9:01 AM End time: 08/30/2019 9:11 AM Injection made incrementally with aspirations every 5 mL.  Performed by: Personally  Anesthesiologist: Freddrick March, MD  Additional Notes: Monitors applied. No increased pain on injection. No increased resistance to injection. Injection made in 5cc increments. Good needle visualization. Patient tolerated procedure well.

## 2019-08-31 ENCOUNTER — Encounter: Payer: Self-pay | Admitting: *Deleted

## 2019-08-31 NOTE — Addendum Note (Signed)
Addendum  created 08/31/19 1154 by Tawni Millers, CRNA   Charge Capture section accepted

## 2019-09-05 ENCOUNTER — Encounter: Payer: Self-pay | Admitting: *Deleted

## 2019-09-07 LAB — SURGICAL PATHOLOGY

## 2019-09-12 NOTE — Progress Notes (Signed)
Patient Care Team: Darrol Jump, PA-C as PCP - General (Family Medicine) Rockwell Germany, RN as Oncology Nurse Navigator Mauro Kaufmann, RN as Oncology Nurse Navigator  DIAGNOSIS:    ICD-10-CM   1. Malignant neoplasm of upper-inner quadrant of left breast in female, estrogen receptor positive ([place])  [place]   Z17.0     SUMMARY OF ONCOLOGIC HISTORY: Oncology History  Malignant neoplasm of upper-inner quadrant of left breast in female, estrogen receptor positive ([place])  04/28/2019 Initial Diagnosis   Palpable left breast lump.  Mammogram 04/21/19 showed a 4.5cm mass at the 11:30 position with smaller adjacent solid masses and one abnormal left axillary lymph node. 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   05/10/2019 Cancer Staging   Staging form: Breast, AJCC 8th Edition - Clinical stage from 05/10/2019: Stage IIB (cT2, cN1, cM0, G3, ER+, PR+, HER2-) - Signed by Nicholas Lose, MD on 05/10/2019   05/19/2019 - 08/19/2019 Chemotherapy   The patient had palonosetron (ALOXI) injection 0.25 mg, 0.25 mg, Intravenous,  Once, 4 of 4 cycles Administration: 0.25 mg (05/19/2019), 0.25 mg (06/09/2019), 0.25 mg (07/08/2019), 0.25 mg (07/30/2019) pegfilgrastim-jmdb (FULPHILA) injection 6 mg, 6 mg, Subcutaneous,  Once, 4 of 4 cycles Administration: 6 mg (05/20/2019), 6 mg (06/11/2019), 6 mg (07/11/2019), 6 mg (08/01/2019) cyclophosphamide (CYTOXAN) 1,280 mg in sodium chloride 0.9 % 250 mL chemo infusion, 600 mg/m2 = 1,280 mg, Intravenous,  Once, 4 of 4 cycles Dose modification: 400 mg/m2 (original dose 600 mg/m2, Cycle 3, Reason: Dose not tolerated) Administration: 1,280 mg (05/19/2019), 1,280 mg (06/09/2019), 840 mg (07/08/2019), 840 mg (07/30/2019) DOCEtaxel (TAXOTERE) 160 mg in sodium chloride 0.9 % 250 mL chemo infusion, 75 mg/m2 = 160 mg, Intravenous,  Once, 4 of 4 cycles Dose  modification: 50 mg/m2 (original dose 75 mg/m2, Cycle 3, Reason: Dose not tolerated) Administration: 160 mg (05/19/2019), 160 mg (06/09/2019), 110 mg (07/08/2019), 110 mg (07/30/2019) fosaprepitant (EMEND) 150 mg in sodium chloride 0.9 % 145 mL IVPB, , Intravenous,  Once, 4 of 4 cycles Administration:  (05/19/2019),  (06/09/2019),  (07/08/2019),  (07/30/2019)  for chemotherapy treatment.    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    09/20/2019 -  Chemotherapy   The patient had trastuzumab-dkst (OGIVRI) 714 mg in sodium chloride 0.9 % 250 mL chemo infusion, 8 mg/kg = 714 mg, Intravenous,  Once, 0 of 17 cycles  for chemotherapy treatment.      CHIEF COMPLIANT: Follow-up s/p lumpectomy to review pathology  INTERVAL HISTORY: Arionne Iams is a 54 y.o. with above-mentioned history of left breast cancer who completed neoadjuvant chemotherapy with Taxotere and Cytoxan.She underwent a left lumpectomy with Dr. Barry Dienes on 08/30/19 for which pathology showed invasive ductal carcinoma, grade 3, 1.5cm, with high grade DCIS, clear margins, 1/3 left axillary lymph nodes positive for carcinoma. She presents to the clinic today to review the pathology report and discuss further treatment.   ALLERGIES:  has No Known Allergies.  MEDICATIONS:  Current Outpatient Medications  Medication Sig Dispense Refill  . doxycycline (ADOXA) 50 MG tablet Take 50 mg by mouth 2 (two) times daily.    Marland Kitchen oxyCODONE (OXY IR/ROXICODONE) 5 MG immediate release tablet Take 1 tablet (5 mg total) by mouth every 6 (six) hours as needed for severe pain. 15  tablet 0   No current facility-administered medications for this visit.    PHYSICAL EXAMINATION: ECOG PERFORMANCE STATUS: 1 - Symptomatic but completely ambulatory  Vitals:   09/13/19 1408  BP: (!) 168/94  Pulse: 90  Resp: 17  Temp: 98.5 F (36.9 C)  SpO2:  100%   Filed Weights   09/13/19 1408  Weight: 196 lb 12.8 oz (89.3 kg)    LABORATORY DATA:  I have reviewed the data as listed CMP Latest Ref Rng & Units 07/29/2019 07/08/2019 07/01/2019  Glucose 70 - 99 mg/dL 101(H) 90 109(H)  BUN 6 - 20 mg/dL '10 11 15  ' Creatinine 0.44 - 1.00 mg/dL 0.65 0.68 0.70  Sodium 135 - 145 mmol/L 136 138 142  Potassium 3.5 - 5.1 mmol/L 3.8 3.6 3.7  Chloride 98 - 111 mmol/L 104 104 104  CO2 22 - 32 mmol/L '23 26 25  ' Calcium 8.9 - 10.3 mg/dL 9.1 9.4 9.0  Total Protein 6.5 - 8.1 g/dL 7.4 7.2 7.2  Total Bilirubin 0.3 - 1.2 mg/dL 0.6 0.8 1.0  Alkaline Phos 38 - 126 U/L 101 101 99  AST 15 - 41 U/L 122(H) 132(H) 243(HH)  ALT 0 - 44 U/L 85(H) 169(H) 149(H)    Lab Results  Component Value Date   WBC 5.8 07/29/2019   HGB 9.5 (L) 07/29/2019   HCT 30.7 (L) 07/29/2019   MCV 92.2 07/29/2019   PLT 339 07/29/2019   NEUTROABS 3.8 07/29/2019    ASSESSMENT & PLAN:  Malignant neoplasm of upper-inner quadrant of left breast in female, estrogen receptor positive ([place]) 04/28/2019: Palpable left breast lump, mammogram 4.5 cm mass at 11:30 position and adjacent smaller masses, 1 abnormal left axillary lymph node: Biopsy of breast and axilla both positive for grade 3 IDC ER 95%, PR 90%, Ki-67 70%, HER-2 negative T2N1 stage IIb clinical stage  Recommendation: 1.Neoadjuvant chemotherapywith Taxotere and Cytoxan every 3 weeks x4 completed 07/30/2019 2.Breast conserving surgery with targeted node dissection 08/30/2019 3.Adjuvant radiation therapy 4.Followed by adjuvant antiestrogen therapy --------------------------------------------------------------------------------------------------------------------------------------------------------- Post neoadjuvant breast MRI 08/05/2019: Left breast cancer previously 3.8 cm is now 2.5 cm now ill-defined and less confluent.  Retroareolar mass 1.3 cm is now minimal enhancement.  Left axilla lymph node abnormal  Left lumpectomy  08/30/2019: Left upper outer quadrant: Grade 3 IDC 1.5 cm with high-grade DCIS, margins negative Left lateral lumpectomy: Grade 3 IDC microscopic focus margins negative, 1/3 lymph nodes positive Repeat prognostic panel: ER 50% moderate, PR 10% strong, Ki-67 2%, HER-2 equivocal by IHC FISH positive for HER-2 ratio 3.5 and copy #5.55.  (Heterogeneity in HER-2 expression)  Recommendation: 1.  Adjuvant Herceptin 2.  Adjuvant radiation 3.  Followed by adjuvant antiestrogen therapy.     Orders Placed This Encounter  Procedures  . PHYSICIAN COMMUNICATION ORDER    A baseline Echo/Muga should be obtained prior to initiation of Trastuzumab, at 3, 6, 9 months during Trastuzumab treatment.   The patient has a good understanding of the overall plan. she agrees with it. she will call with any problems that may develop before the next visit here.  Total time spent: 30 mins including face to face time and time spent for planning, charting and coordination of care  Nicholas Lose, MD 09/13/2019  I, Cloyde Reams Dorshimer, am acting as scribe for Dr. Nicholas Lose.  I have reviewed the above documentation for accuracy and completeness, and I agree with the above.

## 2019-09-13 ENCOUNTER — Other Ambulatory Visit: Payer: Self-pay

## 2019-09-13 ENCOUNTER — Inpatient Hospital Stay: Payer: BC Managed Care – PPO | Attending: Hematology and Oncology | Admitting: Hematology and Oncology

## 2019-09-13 VITALS — BP 168/94 | HR 90 | Temp 98.5°F | Resp 17 | Ht 69.0 in | Wt 196.8 lb

## 2019-09-13 DIAGNOSIS — C773 Secondary and unspecified malignant neoplasm of axilla and upper limb lymph nodes: Secondary | ICD-10-CM | POA: Diagnosis not present

## 2019-09-13 DIAGNOSIS — C50212 Malignant neoplasm of upper-inner quadrant of left female breast: Secondary | ICD-10-CM | POA: Insufficient documentation

## 2019-09-13 DIAGNOSIS — Z5112 Encounter for antineoplastic immunotherapy: Secondary | ICD-10-CM | POA: Insufficient documentation

## 2019-09-13 DIAGNOSIS — Z170 Estrogen receptor positive status [ER+]: Secondary | ICD-10-CM | POA: Diagnosis not present

## 2019-09-13 NOTE — Assessment & Plan Note (Signed)
04/28/2019: Palpable left breast lump, mammogram 4.5 cm mass at 11:30 position and adjacent smaller masses, 1 abnormal left axillary lymph node: Biopsy of breast and axilla both positive for grade 3 IDC ER 95%, PR 90%, Ki-67 70%, HER-2 negative T2N1 stage IIb clinical stage  Recommendation: 1.Neoadjuvant chemotherapywith Taxotere and Cytoxan every 3 weeks x4 completed 07/30/2019 2.Breast conserving surgery with targeted node dissection 08/30/2019 3.Adjuvant radiation therapy 4.Followed by adjuvant antiestrogen therapy --------------------------------------------------------------------------------------------------------------------------------------------------------- Post neoadjuvant breast MRI 08/05/2019: Left breast cancer previously 3.8 cm is now 2.5 cm now ill-defined and less confluent.  Retroareolar mass 1.3 cm is now minimal enhancement.  Left axilla lymph node abnormal  Left lumpectomy 08/30/2019: Left upper outer quadrant: Grade 3 IDC 1.5 cm with high-grade DCIS, margins negative Left lateral lumpectomy: Grade 3 IDC microscopic focus margins negative, 1/3 lymph nodes positive Repeat prognostic panel: ER 50% moderate, PR 10% strong, Ki-67 2%, HER-2 equivocal by IHC FISH positive for HER-2 ratio 3.5 and copy #5.55.  (Heterogeneity in HER-2 expression)  Recommendation: 1.  Adjuvant Herceptin 2.  Adjuvant radiation 3.  Followed by adjuvant antiestrogen therapy.

## 2019-09-14 ENCOUNTER — Other Ambulatory Visit: Payer: Self-pay | Admitting: *Deleted

## 2019-09-14 ENCOUNTER — Encounter: Payer: Self-pay | Admitting: *Deleted

## 2019-09-14 DIAGNOSIS — C50212 Malignant neoplasm of upper-inner quadrant of left female breast: Secondary | ICD-10-CM

## 2019-09-14 DIAGNOSIS — Z170 Estrogen receptor positive status [ER+]: Secondary | ICD-10-CM

## 2019-09-15 ENCOUNTER — Other Ambulatory Visit: Payer: Self-pay

## 2019-09-15 ENCOUNTER — Ambulatory Visit
Admission: RE | Admit: 2019-09-15 | Discharge: 2019-09-15 | Disposition: A | Discharge status: Home / Self Care | Payer: BC Managed Care – PPO | Source: Ambulatory Visit | Attending: Radiation Oncology | Admitting: Radiation Oncology

## 2019-09-15 ENCOUNTER — Encounter: Payer: Self-pay | Admitting: Radiation Oncology

## 2019-09-15 VITALS — BP 173/89 | HR 93 | Temp 98.7°F | Resp 20 | Ht 69.0 in | Wt 196.0 lb

## 2019-09-15 DIAGNOSIS — Z170 Estrogen receptor positive status [ER+]: Secondary | ICD-10-CM

## 2019-09-15 DIAGNOSIS — C50212 Malignant neoplasm of upper-inner quadrant of left female breast: Secondary | ICD-10-CM | POA: Insufficient documentation

## 2019-09-15 DIAGNOSIS — C773 Secondary and unspecified malignant neoplasm of axilla and upper limb lymph nodes: Secondary | ICD-10-CM | POA: Insufficient documentation

## 2019-09-15 DIAGNOSIS — Z9889 Other specified postprocedural states: Secondary | ICD-10-CM | POA: Diagnosis not present

## 2019-09-15 NOTE — Patient Instructions (Signed)
Coronavirus (COVID-19) Are you at risk?  Are you at risk for the Coronavirus (COVID-19)?  To be considered HIGH RISK for Coronavirus (COVID-19), you have to meet the following criteria:  . Traveled to [place], [place], [place], [place] or [place]; or in the [place] to [place], [place], [place], or [place]; and have fever, cough, and shortness of breath within the last 2 weeks of travel OR . Been in close contact with a person diagnosed with COVID-19 within the last 2 weeks and have fever, cough, and shortness of breath . IF YOU DO NOT MEET THESE CRITERIA, YOU ARE CONSIDERED LOW RISK FOR COVID-19.  What to do if you are HIGH RISK for COVID-19?  . If you are having a medical emergency, call 911. . Seek medical care right away. Before you go to a doctor's office, urgent care or emergency department, call ahead and tell them about your recent travel, contact with someone diagnosed with COVID-19, and your symptoms. You should receive instructions from your physician's office regarding next steps of care.  . When you arrive at healthcare provider, tell the healthcare staff immediately you have returned from visiting [place], [place], [place], [place] or [place]; or traveled in the [place] to [place], [place], [place], or [place]; in the last two weeks or you have been in close contact with a person diagnosed with COVID-19 in the last 2 weeks.   . Tell the health care staff about your symptoms: fever, cough and shortness of breath. . After you have been seen by a medical provider, you will be either: o Tested for (COVID-19) and discharged home on quarantine except to seek medical care if symptoms worsen, and asked to  - Stay home and avoid contact with others until you get your results (4-5 days)  - Avoid travel on public transportation if possible (such as bus, train, or airplane) or o Sent to the Emergency Department by EMS for evaluation, COVID-19 testing, and possible  admission depending on your condition and test results.  What to do if you are LOW RISK for COVID-19?  Reduce your risk of any infection by using the same precautions used for avoiding the common cold or flu:  . Wash your hands often with soap and warm water for at least 20 seconds.  If soap and water are not readily available, use an alcohol-based hand sanitizer with at least 60% alcohol.  . If coughing or sneezing, cover your mouth and nose by coughing or sneezing into the elbow areas of your shirt or coat, into a tissue or into your sleeve (not your hands). . Avoid shaking hands with others and consider head nods or verbal greetings only. . Avoid touching your eyes, nose, or mouth with unwashed hands.  . Avoid close contact with people who are sick. . Avoid places or events with large numbers of people in one location, like concerts or sporting events. . Carefully consider travel plans you have or are making. . If you are planning any travel outside or inside the [place], visit the CDC's Travelers' Health webpage for the latest health notices. . If you have some symptoms but not all symptoms, continue to monitor at home and seek medical attention if your symptoms worsen. . If you are having a medical emergency, call 911.   ADDITIONAL HEALTHCARE OPTIONS FOR PATIENTS  [facility] / e-Visit: [email or web address]         [facility]: [phone]  [facility]: [phone]                   [facility]: [phone]   

## 2019-09-15 NOTE — Progress Notes (Signed)
ASSESSMENT & PLAN:  Malignant neoplasm of upper-inner quadrant of left breast in female, estrogen receptor positive ([place]) 04/28/2019: Palpable left breast lump, mammogram 4.5 cm mass at 11:30 position and adjacent smaller masses, 1 abnormal left axillary lymph node: Biopsy of breast and axilla both positive for grade 3 IDC ER 95%, PR 90%, Ki-67 70%, HER-2 negative T2N1 stage IIb clinical stage  Recommendation: 1.Neoadjuvant chemotherapywith Taxotere and Cytoxan every 3 weeks x4 completed 07/30/2019 2.Breast conserving surgery with targeted node dissection 08/30/2019 3.Adjuvant radiation therapy 4.Followed by adjuvant antiestrogen therapy --------------------------------------------------------------------------------------------------------------------------------------------------------- Post neoadjuvant breast MRI 08/05/2019: Left breast cancer previously 3.8 cm is now 2.5 cm now ill-defined and less confluent. Retroareolar mass 1.3 cm is now minimal enhancement. Left axilla lymph node abnormal  Left lumpectomy 08/30/2019: Left upper outer quadrant: Grade 3 IDC 1.5 cm with high-grade DCIS, margins negative Left lateral lumpectomy: Grade 3 IDC microscopic focus margins negative, 1/3 lymph nodes positive Repeat prognostic panel: ER 50% moderate, PR 10% strong, Ki-67 2%, HER-2 equivocal by IHC FISH positive for HER-2 ratio 3.5 and copy #5.55.  (Heterogeneity in HER-2 expression)  Recommendation: 1.  Adjuvant Herceptin 2.  Adjuvant radiation 3.  Followed by adjuvant antiestrogen therapy.  SAFETY ISSUES:  Prior radiation? No  Pacemaker/ICD? No  Possible current pregnancy? No  Is the patient on methotrexate? No  Additional Complaints / other details:  Pt reports lymphedema from surgery, good ROM noted. Pt presents today for initial consult with Dr. Sondra Come for Radiation Oncology. Pt is accompanied by sister.  BP (!) 173/89 (BP Location: Right Arm, Patient Position: Sitting,  Cuff Size: Normal)   Pulse 93   Temp 98.7 F (37.1 C)   Resp 20   Ht '5\' 9"'  (1.753 m)   Wt 196 lb (88.9 kg)   SpO2 100%   BMI 28.94 kg/m   Wt Readings from Last 3 Encounters:  09/15/19 196 lb (88.9 kg)  09/13/19 196 lb 12.8 oz (89.3 kg)  08/30/19 194 lb 0.1 oz (88 kg)   Loma Sousa, RN BSN

## 2019-09-15 NOTE — Progress Notes (Signed)
Radiation Oncology         [phone] ________________________________  Initial Outpatient Consultation  Name: Elizabeth Sharp MRN: [id number]  Date: 09/15/2019  DOB: 1965/10/11  BZ:JIRCVEL, Daine Gip, MD   REFERRING PHYSICIAN: Nicholas Lose, MD  DIAGNOSIS: The encounter diagnosis was Malignant neoplasm of upper-inner quadrant of left breast in female, estrogen receptor positive ([place]).  Stage IIB (ympT1C, pNIa) Left Breast UIQ, Invasive Ductal Carcinoma, ER+ / PR+ / Her2-, Grade 3  HISTORY OF PRESENT ILLNESS::Elizabeth Sharp is a 54 y.o. female who is accompanied by her sister. She presented to her PCP with a palpable left breast lump. She underwent bilateral diagnostic mammography and left breast and right axilla ultrasonography on 04/21/2019 showing: 2.7 cm upper-outer right breast micro-calcifications; 4.5 cm palpable left breast mass at 11:30, with smaller immediate adjacent masses; one abnormal left axillary lymph node; right axilla is negative.  Biopsy on 04/28/2019 showed: invasive ductal carcinoma, grade 3 in the left breast; left axilla lymph node positive for metastatic carcinoma. Prognostic indicators significant for: estrogen receptor, 95% positive and progesterone receptor, 90% positive. Proliferation marker Ki67 at 70%. HER2 negative by FISH. The right breast biopsy was benign.  She was treated with neoadjuvant chemotherapy consisting of taxotere and cytoxan under Dr. Lindi Adie from 05/19/2019 - 08/19/2019.  She underwent breast MRI on 06/01/2019 showed: 3.8 cm biopsy-proven malignancy in upper-outer left breast, and second 1.1 cm mass immediately adjacent; suspicious 1.3 cm mass in inferior subareolar left breast; indeterminate 4-5 mm enhancing superior subareolar right breast mass.  Biopsy of the left breast area at 6 o'clock on 06/21/2019 showed: invasive ductal carcinoma, grade 2. Estrogen receptor 20% positive, progesterone receptor 5% positive.  Ki67 of 1%. Her2 negative.  She opted to proceed with left lumpectomy on 08/30/2019 under Dr. Barry Dienes. Pathology from the procedure showed: invasive ductal carcinoma, grade 3, 1.5 cm; high grade DCIS; negative margins; one lymph node positive for metastatic carcinoma (1/3).  Post-treatment breast MRI on 08/04/2019 showed mild treatment response to the left breast and left axillary lymph node.  She is scheduled to start on maintenance trastuzumab on 09/20/2019.   PREVIOUS RADIATION THERAPY: No  PAST MEDICAL HISTORY:  Past Medical History:  Diagnosis Date  . Cancer ([place])    L breast cancer    PAST SURGICAL HISTORY: Past Surgical History:  Procedure Laterality Date  . BREAST BIOPSY Bilateral   . BREAST LUMPECTOMY WITH RADIOACTIVE SEED AND SENTINEL LYMPH NODE BIOPSY Left 08/30/2019   Procedure: LEFT BREAST LUMPECTOMY X 2 WITH RADIOACTIVE SEED AND SEED TARGETED LYMPH NODE BIOPSY, SENTINEL LYMPH NODE BIOPSY;  Surgeon: [name], MD;  Location: [place];  Service: General;  Laterality: Left;  . PORTACATH PLACEMENT Left 05/17/2019   Procedure: INSERTION PORT-A-CATH WITH POSSIBLE ULTRASOUND;  Surgeon: [name], MD;  Location: [place];  Service: General;  Laterality: Left;  . TUBAL LIGATION      FAMILY HISTORY: No family history on file.  SOCIAL HISTORY:  Social History   Tobacco Use  . Smoking status: Never Smoker  . Smokeless tobacco: Never Used  Substance Use Topics  . Alcohol use: Yes    Comment: 1 drink once weekly  . Drug use: Never    ALLERGIES: No Known Allergies  MEDICATIONS:  Current Outpatient Medications  Medication Sig Dispense Refill  . doxycycline (ADOXA) 50 MG tablet Take 50 mg by mouth 2 (two) times daily.    Marland Kitchen oxyCODONE (OXY IR/ROXICODONE) 5 MG immediate release tablet Take  1 tablet (5 mg total) by mouth every 6 (six) hours as needed for severe pain. (Patient not taking: Reported on 09/15/2019) 15 tablet 0   No current facility-administered  medications for this encounter.    REVIEW OF SYSTEMS:  A 10+ POINT REVIEW OF SYSTEMS WAS OBTAINED including neurology, dermatology, psychiatry, cardiac, respiratory, lymph, extremities, GI, GU, musculoskeletal, constitutional, reproductive, HEENT. She reports lymphedema from her lumpectomy, but her range of motion remains intact.   PHYSICAL EXAM:  height is '5\' 9"'  (1.753 m) and weight is 196 lb (88.9 kg). Her temperature is 98.7 F (37.1 C). Her blood pressure is 173/89 (abnormal) and her pulse is 93. Her respiration is 20 and oxygen saturation is 100%.   General: Alert and oriented, in no acute distress HEENT: Head is normocephalic. Extraocular movements are intact. Oropharynx is clear. Neck: Neck is supple, no palpable cervical or supraclavicular lymphadenopathy. Heart: Regular in rate and rhythm with no murmurs, rubs, or gallops. Chest: Clear to auscultation bilaterally, with no rhonchi, wheezes, or rales. Abdomen: Soft, nontender, nondistended, with no rigidity or guarding. Extremities: No cyanosis or edema. Lymphatics: see Neck Exam Skin: No concerning lesions. Musculoskeletal: symmetric strength and muscle tone throughout. Neurologic: Cranial nerves II through XII are grossly intact. No obvious focalities. Speech is fluent. Coordination is intact. Psychiatric: Judgment and insight are intact. Affect is appropriate. Right Breast: no palpable mass, nipple discharge or bleeding. Left Breast: Lumpectomy and axillary scars healing well.  No signs of drainage or infection  ECOG = 1  0 - Asymptomatic (Fully active, able to carry on all predisease activities without restriction)  1 - Symptomatic but completely ambulatory (Restricted in physically strenuous activity but ambulatory and able to carry out work of a light or sedentary nature. For example, light housework, office work)  2 - Symptomatic, <50% in bed during the day (Ambulatory and capable of all self care but unable to carry out any  work activities. Up and about more than 50% of waking hours)  3 - Symptomatic, >50% in bed, but not bedbound (Capable of only limited self-care, confined to bed or chair 50% or more of waking hours)  4 - Bedbound (Completely disabled. Cannot carry on any self-care. Totally confined to bed or chair)  5 - Death   Eustace Pen MM, Creech RH, Tormey DC, et al. [phone]). "Toxicity and response criteria of the [facility]". [place] Oncol. 5 (6): 649-55  LABORATORY DATA:  Lab Results  Component Value Date   WBC 5.8 07/29/2019   HGB 9.5 (L) 07/29/2019   HCT 30.7 (L) 07/29/2019   MCV 92.2 07/29/2019   PLT 339 07/29/2019   NEUTROABS 3.8 07/29/2019   Lab Results  Component Value Date   NA 136 07/29/2019   K 3.8 07/29/2019   CL 104 07/29/2019   CO2 23 07/29/2019   GLUCOSE 101 (H) 07/29/2019   CREATININE 0.65 07/29/2019   CALCIUM 9.1 07/29/2019      RADIOGRAPHY: NM Sentinel Node Inj-No Rpt (Breast)  Result Date: 08/30/2019 Sulfur colloid was injected by the nuclear medicine technologist for melanoma sentinel node.   MM Breast Surgical Specimen  Result Date: 08/30/2019 CLINICAL DATA:  Status post seed localization of the LEFT breast. EXAM: SPECIMEN RADIOGRAPH OF THE LEFT BREAST COMPARISON:  Previous exam(s). FINDINGS: Status post excision of the left breast. Radioactive seed and ribbon shaped clip are present, completely intact, and were marked for pathology. IMPRESSION: Specimen radiograph of the left breast. Electronically Signed   By: Benjamine Mola  Owens Shark M.D.   On: 08/30/2019 11:43   MM Breast Surgical Specimen  Result Date: 08/30/2019 CLINICAL DATA:  Status post radioactive seed localization LEFT axilla. EXAM: SPECIMEN RADIOGRAPH OF THE LEFT axilla COMPARISON:  08/29/2019 FINDINGS: Status post excision of the left axilla. The radioactive seed is present. Of note, the Q shaped clip is not identified, and was felt to be displaced. IMPRESSION: Specimen radiograph of the  left axilla. Electronically Signed   By: Nolon Nations M.D.   On: 08/30/2019 11:42   MM Breast Surgical Specimen  Result Date: 08/30/2019 CLINICAL DATA:  Status post radioactive seed localization EXAM: SPECIMEN RADIOGRAPH OF THE LEFT BREAST COMPARISON:  08/29/2019 FINDINGS: Status post excision of the left breast. Radioactive seed and two dumbbell shaped clips are present, completely intact, and were marked for pathology. IMPRESSION: Specimen radiograph of the left breast. Electronically Signed   By: Nolon Nations M.D.   On: 08/30/2019 11:36   MM LT RADIOACTIVE SEED LOC MAMMO GUIDE  Result Date: 08/29/2019 CLINICAL DATA:  54 year old female presenting for radioactive seed localization of the left breast prior to lumpectomy and targeted left axillary lymph node dissection. EXAM: MAMMOGRAPHIC GUIDED RADIOACTIVE SEED LOCALIZATION OF THE LEFT BREAST ULTRASOUND-GUIDED RADIOACTIVE SEED LOCALIZATION OF THE LEFT AXILLA COMPARISON:  Previous exam(s). FINDINGS: Patient presents for radioactive seed localization prior to left breast lumpectomy and targeted left axillary lymph node dissection. I met with the patient and we discussed the procedure of seed localization including benefits and alternatives. We discussed the high likelihood of a successful procedure. We discussed the risks of the procedure including infection, bleeding, tissue injury and further surgery. We discussed the low dose of radioactivity involved in the procedure. Informed, written consent was given. The usual time-out protocol was performed immediately prior to the procedure. Using mammographic guidance, sterile technique, 1% lidocaine and an I-125 radioactive seed, the ribbon shaped biopsy marking clip in the superior central left breast was localized using a superior approach. The follow-up mammogram images confirm the seed in the expected location and were marked for Dr. Barry Dienes. Follow-up survey of the patient confirms presence of the  radioactive seed. Order number of I-125 seed:  [id number]. Total activity:  8.921 millicuries reference Date: 08/26/2019 ----------------------------------------------- Using mammographic guidance, sterile technique, 1% lidocaine and an I-125 radioactive seed, the 2 adjacent dumbbell-shaped biopsy marking clip in the retroareolar left breast was localized using a superior approach. The follow-up mammogram images confirm the seed in the expected location and were marked for Dr. Barry Dienes. Follow-up survey of the patient confirms presence of the radioactive seed. Order number of I-125 seed:  [id number]. Total activity:  4.481 millicuries reference Date: 08/26/2019 ----------------------------------------------- The usual time-out protocol was performed immediately prior to the procedure. Using ultrasound guidance, sterile technique, 1% lidocaine and an I-125 radioactive seed, a left axillary lymph node was localized using an inferior approach. Note that the Q shaped biopsy marking clip today could not be seen sonographically, and was previously presumed displaced at the time of biopsy. A biopsy tract could be seen extending down to the lymph node that I localized deep in the axilla which had a similar morphology to the lymph node on the original diagnostic ultrasound. The follow-up mammogram images show the seed and the biopsy marking clip are proximally 2 cm apart the seed in the expected location and were marked for Dr. Barry Dienes. Follow-up survey of the patient confirms presence of the radioactive seed. Order number of I-125 seed:  [id number]. Total activity:  2.637 millicuries reference Date:  08/26/2019 The patient tolerated the procedure well and was released from the [place]. She was given instructions regarding seed removal. IMPRESSION: Two radioactive seed localizations of the left breast and 1 radioactive seed localization of the left axilla. The radioactive seed in the left axilla is believed to be within the  biopsied lymph node, as the biopsy marking clip was displaced. This was conveyed to Dr. Barry Dienes by phone 08/29/2019. No apparent complications. Electronically Signed   By: Ammie Ferrier M.D.   On: 08/29/2019 16:14   MM LT RAD SEED EA ADD LESION LOC MAMMO  Result Date: 08/29/2019 CLINICAL DATA:  54 year old female presenting for radioactive seed localization of the left breast prior to lumpectomy and targeted left axillary lymph node dissection. EXAM: MAMMOGRAPHIC GUIDED RADIOACTIVE SEED LOCALIZATION OF THE LEFT BREAST ULTRASOUND-GUIDED RADIOACTIVE SEED LOCALIZATION OF THE LEFT AXILLA COMPARISON:  Previous exam(s). FINDINGS: Patient presents for radioactive seed localization prior to left breast lumpectomy and targeted left axillary lymph node dissection. I met with the patient and we discussed the procedure of seed localization including benefits and alternatives. We discussed the high likelihood of a successful procedure. We discussed the risks of the procedure including infection, bleeding, tissue injury and further surgery. We discussed the low dose of radioactivity involved in the procedure. Informed, written consent was given. The usual time-out protocol was performed immediately prior to the procedure. Using mammographic guidance, sterile technique, 1% lidocaine and an I-125 radioactive seed, the ribbon shaped biopsy marking clip in the superior central left breast was localized using a superior approach. The follow-up mammogram images confirm the seed in the expected location and were marked for Dr. Barry Dienes. Follow-up survey of the patient confirms presence of the radioactive seed. Order number of I-125 seed:  [id number]. Total activity:  7.106 millicuries reference Date: 08/26/2019 ----------------------------------------------- Using mammographic guidance, sterile technique, 1% lidocaine and an I-125 radioactive seed, the 2 adjacent dumbbell-shaped biopsy marking clip in the retroareolar left breast  was localized using a superior approach. The follow-up mammogram images confirm the seed in the expected location and were marked for Dr. Barry Dienes. Follow-up survey of the patient confirms presence of the radioactive seed. Order number of I-125 seed:  [id number]. Total activity:  7.035 millicuries reference Date: 08/26/2019 ----------------------------------------------- The usual time-out protocol was performed immediately prior to the procedure. Using ultrasound guidance, sterile technique, 1% lidocaine and an I-125 radioactive seed, a left axillary lymph node was localized using an inferior approach. Note that the Q shaped biopsy marking clip today could not be seen sonographically, and was previously presumed displaced at the time of biopsy. A biopsy tract could be seen extending down to the lymph node that I localized deep in the axilla which had a similar morphology to the lymph node on the original diagnostic ultrasound. The follow-up mammogram images show the seed and the biopsy marking clip are proximally 2 cm apart the seed in the expected location and were marked for Dr. Barry Dienes. Follow-up survey of the patient confirms presence of the radioactive seed. Order number of I-125 seed:  [id number]. Total activity:  9.371 millicuries reference Date: 08/26/2019 The patient tolerated the procedure well and was released from the [place]. She was given instructions regarding seed removal. IMPRESSION: Two radioactive seed localizations of the left breast and 1 radioactive seed localization of the left axilla. The radioactive seed in the left axilla is believed to be within the biopsied lymph node, as the biopsy marking clip was displaced. This was conveyed to Dr. Barry Dienes by  phone 08/29/2019. No apparent complications. Electronically Signed   By: Ammie Ferrier M.D.   On: 08/29/2019 16:14   [place] LT RADIOACTIVE SEED LOC  Result Date: 08/29/2019 CLINICAL DATA:  54 year old female presenting for radioactive seed  localization of the left breast prior to lumpectomy and targeted left axillary lymph node dissection. EXAM: MAMMOGRAPHIC GUIDED RADIOACTIVE SEED LOCALIZATION OF THE LEFT BREAST ULTRASOUND-GUIDED RADIOACTIVE SEED LOCALIZATION OF THE LEFT AXILLA COMPARISON:  Previous exam(s). FINDINGS: Patient presents for radioactive seed localization prior to left breast lumpectomy and targeted left axillary lymph node dissection. I met with the patient and we discussed the procedure of seed localization including benefits and alternatives. We discussed the high likelihood of a successful procedure. We discussed the risks of the procedure including infection, bleeding, tissue injury and further surgery. We discussed the low dose of radioactivity involved in the procedure. Informed, written consent was given. The usual time-out protocol was performed immediately prior to the procedure. Using mammographic guidance, sterile technique, 1% lidocaine and an I-125 radioactive seed, the ribbon shaped biopsy marking clip in the superior central left breast was localized using a superior approach. The follow-up mammogram images confirm the seed in the expected location and were marked for Dr. Barry Dienes. Follow-up survey of the patient confirms presence of the radioactive seed. Order number of I-125 seed:  [id number]. Total activity:  0.865 millicuries reference Date: 08/26/2019 ----------------------------------------------- Using mammographic guidance, sterile technique, 1% lidocaine and an I-125 radioactive seed, the 2 adjacent dumbbell-shaped biopsy marking clip in the retroareolar left breast was localized using a superior approach. The follow-up mammogram images confirm the seed in the expected location and were marked for Dr. Barry Dienes. Follow-up survey of the patient confirms presence of the radioactive seed. Order number of I-125 seed:  [id number]. Total activity:  2.841 millicuries reference Date: 08/26/2019  ----------------------------------------------- The usual time-out protocol was performed immediately prior to the procedure. Using ultrasound guidance, sterile technique, 1% lidocaine and an I-125 radioactive seed, a left axillary lymph node was localized using an inferior approach. Note that the Q shaped biopsy marking clip today could not be seen sonographically, and was previously presumed displaced at the time of biopsy. A biopsy tract could be seen extending down to the lymph node that I localized deep in the axilla which had a similar morphology to the lymph node on the original diagnostic ultrasound. The follow-up mammogram images show the seed and the biopsy marking clip are proximally 2 cm apart the seed in the expected location and were marked for Dr. Barry Dienes. Follow-up survey of the patient confirms presence of the radioactive seed. Order number of I-125 seed:  [id number]. Total activity:  2.536 millicuries reference Date: 08/26/2019 The patient tolerated the procedure well and was released from the [place]. She was given instructions regarding seed removal. IMPRESSION: Two radioactive seed localizations of the left breast and 1 radioactive seed localization of the left axilla. The radioactive seed in the left axilla is believed to be within the biopsied lymph node, as the biopsy marking clip was displaced. This was conveyed to Dr. Barry Dienes by phone 08/29/2019. No apparent complications. Electronically Signed   By: Ammie Ferrier M.D.   On: 08/29/2019 16:14      IMPRESSION: Stage IIB (ympT1C, pNIa) Left Breast UIQ, Invasive Ductal Carcinoma, ER+ / PR+ / Her2-, Grade 3  Patient will be a good candidate for adjuvant radiation therapy directed at the left breast.  Would also recommend coverage of the axillary region at the time of her breast  radiation treatments given the positive sentinel nodes as above.  Today, I talked to the patient and sister about the findings and work-up thus far.  We  discussed the natural history of breast cancer and general treatment, highlighting the role of radiotherapy in the management.  We discussed the available radiation techniques, and focused on the details of logistics and delivery.  We reviewed the anticipated acute and late sequelae associated with radiation in this setting.  The patient was encouraged to ask questions that I answered to the best of my ability.  A patient consent form was discussed and signed.  We retained a copy for our records.  The patient would like to proceed with radiation and will be scheduled for CT simulation.  PLAN: Patient will be scheduled for CT simulation approximately 4 weeks out from her surgery with treatments to begin 5 to 6 weeks postop.  Anticipate 6-1/2 weeks of radiation therapy   65 minutes of total time was spent for this patient encounter, including preparation, face-to-face counseling with the patient and coordination of care, physical exam, and documentation of the encounter.   ------------------------------------------------  Blair Promise, PhD, MD  This document serves as a record of services personally performed by Gery Pray, MD. It was created on his behalf by Wilburn Mylar, a trained medical scribe. The creation of this record is based on the scribe's personal observations and the provider's statements to them. This document has been checked and approved by the attending provider.

## 2019-09-16 ENCOUNTER — Ambulatory Visit (HOSPITAL_COMMUNITY)
Admission: RE | Admit: 2019-09-16 | Discharge: 2019-09-16 | Disposition: A | Discharge status: Home / Self Care | Payer: BC Managed Care – PPO | Source: Ambulatory Visit | Attending: Hematology and Oncology | Admitting: Hematology and Oncology

## 2019-09-16 ENCOUNTER — Encounter: Payer: Self-pay | Admitting: *Deleted

## 2019-09-16 DIAGNOSIS — Z01810 Encounter for preprocedural cardiovascular examination: Secondary | ICD-10-CM | POA: Insufficient documentation

## 2019-09-16 DIAGNOSIS — Z170 Estrogen receptor positive status [ER+]: Secondary | ICD-10-CM | POA: Insufficient documentation

## 2019-09-16 DIAGNOSIS — C50212 Malignant neoplasm of upper-inner quadrant of left female breast: Secondary | ICD-10-CM | POA: Diagnosis not present

## 2019-09-16 NOTE — Progress Notes (Signed)
  Echocardiogram 2D Echocardiogram has been performed.  Elizabeth Sharp 09/16/2019, 11:58 AM

## 2019-09-19 ENCOUNTER — Telehealth: Payer: Self-pay | Admitting: Hematology and Oncology

## 2019-09-19 NOTE — Telephone Encounter (Signed)
Faxed medical records to [facility] at [phone]. Release ID: [id number]

## 2019-09-19 NOTE — Progress Notes (Signed)
Pharmacist Chemotherapy Monitoring - Initial Assessment    Anticipated start date: 09/20/19  Regimen:  . Are orders appropriate based on the patient's diagnosis, regimen, and cycle? Yes . Does the plan date match the patient's scheduled date? Yes . Is the sequencing of drugs appropriate? Yes . Are the premedications appropriate for the patient's regimen? Yes . Prior Authorization for treatment is: Approved o If applicable, is the correct biosimilar selected based on the patient's insurance? yes  Organ Function and Labs: Marland Kitchen Are dose adjustments needed based on the patient's renal function, hepatic function, or hematologic function? Yes . Are appropriate labs ordered prior to the start of patient's treatment? Yes . Other organ system assessment, if indicated: trastuzumab-deruztecan: Echo/ MUGA . The following baseline labs, if indicated, have been ordered: N/A  Dose Assessment: . Are the drug doses appropriate? Yes . Are the following correct: o Drug concentrations Yes o IV fluid compatible with drug Yes o Administration routes Yes o Timing of therapy Yes . If applicable, does the patient have documented access for treatment and/or plans for port-a-cath placement? yes . If applicable, have lifetime cumulative doses been properly documented and assessed? not applicable Lifetime Dose Tracking  No doses have been documented on this patient for the following tracked chemicals: Doxorubicin, Epirubicin, Idarubicin, Daunorubicin, Mitoxantrone, Bleomycin, Oxaliplatin, Carboplatin, Liposomal Doxorubicin  o   Toxicity Monitoring/Prevention: . The patient has the following take home antiemetics prescribed: N/A . The patient has the following take home medications prescribed: N/A . Medication allergies and previous infusion related reactions, if applicable, have been reviewed and addressed. Yes . The patient's current medication list has been assessed for drug-drug interactions with their  chemotherapy regimen. no significant drug-drug interactions were identified on review.  Order Review: . Are the treatment plan orders signed? Yes . Is the patient scheduled to see a provider prior to their treatment? No  I verify that I have reviewed each item in the above checklist and answered each question accordingly.  Elizabeth Sharp [facility] 09/19/2019 2:54 PM

## 2019-09-20 ENCOUNTER — Inpatient Hospital Stay: Payer: BC Managed Care – PPO

## 2019-09-20 ENCOUNTER — Other Ambulatory Visit: Payer: Self-pay

## 2019-09-20 VITALS — BP 134/80 | HR 94 | Temp 98.0°F | Resp 16

## 2019-09-20 DIAGNOSIS — Z5112 Encounter for antineoplastic immunotherapy: Secondary | ICD-10-CM | POA: Diagnosis not present

## 2019-09-20 DIAGNOSIS — C773 Secondary and unspecified malignant neoplasm of axilla and upper limb lymph nodes: Secondary | ICD-10-CM | POA: Diagnosis not present

## 2019-09-20 DIAGNOSIS — C50212 Malignant neoplasm of upper-inner quadrant of left female breast: Secondary | ICD-10-CM | POA: Diagnosis not present

## 2019-09-20 DIAGNOSIS — Z170 Estrogen receptor positive status [ER+]: Secondary | ICD-10-CM | POA: Diagnosis not present

## 2019-09-20 MED ORDER — DIPHENHYDRAMINE HCL 25 MG PO CAPS
50.0000 mg | ORAL_CAPSULE | Freq: Once | ORAL | Status: AC
  Administered 2019-09-20: 50 mg via ORAL

## 2019-09-20 MED ORDER — ACETAMINOPHEN 325 MG PO TABS
ORAL_TABLET | ORAL | Status: AC
  Filled 2019-09-20: qty 2

## 2019-09-20 MED ORDER — DIPHENHYDRAMINE HCL 25 MG PO CAPS
ORAL_CAPSULE | ORAL | Status: AC
  Filled 2019-09-20: qty 2

## 2019-09-20 MED ORDER — SODIUM CHLORIDE 0.9% FLUSH
10.0000 mL | INTRAVENOUS | Status: DC | PRN
  Administered 2019-09-20: 10 mL
  Filled 2019-09-20: qty 10

## 2019-09-20 MED ORDER — ACETAMINOPHEN 325 MG PO TABS
650.0000 mg | ORAL_TABLET | Freq: Once | ORAL | Status: AC
  Administered 2019-09-20: 650 mg via ORAL

## 2019-09-20 MED ORDER — HEPARIN SOD (PORK) LOCK FLUSH 100 UNIT/ML IV SOLN
500.0000 [IU] | Freq: Once | INTRAVENOUS | Status: AC | PRN
  Administered 2019-09-20: 500 [IU]
  Filled 2019-09-20: qty 5

## 2019-09-20 MED ORDER — TRASTUZUMAB-DKST CHEMO 150 MG IV SOLR
8.0000 mg/kg | Freq: Once | INTRAVENOUS | Status: AC
  Administered 2019-09-20: 714 mg via INTRAVENOUS
  Filled 2019-09-20: qty 34

## 2019-09-20 MED ORDER — SODIUM CHLORIDE 0.9 % IV SOLN
Freq: Once | INTRAVENOUS | Status: AC
  Filled 2019-09-20: qty 250

## 2019-09-20 NOTE — Patient Instructions (Signed)
[facility] °Discharge Instructions for Patients Receiving Chemotherapy ° °Today you received the following chemotherapy agents: trastuzumab  ° °To help prevent nausea and vomiting after your treatment, we encourage you to take your nausea medication as directed.  If you develop nausea and vomiting that is not controlled by your nausea medication, call the clinic.  ° °BELOW ARE SYMPTOMS THAT SHOULD BE REPORTED IMMEDIATELY: °· *FEVER GREATER THAN 100.5 F °· *CHILLS WITH OR WITHOUT FEVER °· NAUSEA AND VOMITING THAT IS NOT CONTROLLED WITH YOUR NAUSEA MEDICATION °· *UNUSUAL SHORTNESS OF BREATH °· *UNUSUAL BRUISING OR BLEEDING °· TENDERNESS IN MOUTH AND THROAT WITH OR WITHOUT PRESENCE OF ULCERS °· *URINARY PROBLEMS °· *BOWEL PROBLEMS °· UNUSUAL RASH °Items with * indicate a potential emergency and should be followed up as soon as possible. ° °Feel free to call the clinic you have any questions or concerns. The clinic phone number is [phone]. ° °

## 2019-09-21 ENCOUNTER — Telehealth: Payer: Self-pay | Admitting: Emergency Medicine

## 2019-09-21 NOTE — Telephone Encounter (Signed)
-----   Message from Arty Baumgartner, RN sent at 09/20/2019  5:01 PM EDT ----- Regarding: FIRST TIME DR. Lindi Adie FIRST TIME Herceptin, has had chemo previously.  Dr. Lindi Adie.  Tolerated Well.

## 2019-09-21 NOTE — Telephone Encounter (Signed)
Chemo f/u call.  Pt denies any side effects at this time.  Reports some issues with FMLA/LOA paperwork but states she is calling [employer] today to f/u on her paperwork submission.  Pt aware to call CC with any questions/concerns.

## 2019-09-23 ENCOUNTER — Encounter: Payer: Self-pay | Admitting: *Deleted

## 2019-09-27 ENCOUNTER — Encounter: Payer: Self-pay | Admitting: *Deleted

## 2019-09-28 ENCOUNTER — Encounter: Payer: Self-pay | Admitting: *Deleted

## 2019-09-28 NOTE — Progress Notes (Signed)
Radiation Oncology         [phone]) [phone] ________________________________  Name: Elizabeth Sharp MRN: [id number]  Date: 09/29/2019  DOB: [birth year]  SIMULATION AND TREATMENT PLANNING NOTE    ICD-10-CM   1. Malignant neoplasm of upper-inner quadrant of left breast in female, estrogen receptor positive ([name])  C50.212    Z17.0     DIAGNOSIS: Stage IIB (ympT1C, pNIa) Left Breast UIQ, Invasive Ductal Carcinoma, ER+ / PR+ / Her2-, Grade 3  NARRATIVE:  The patient was brought to the [facility].  Identity was confirmed.  All relevant records and images related to the planned course of therapy were reviewed.  The patient freely provided informed written consent to proceed with treatment after reviewing the details related to the planned course of therapy. The consent form was witnessed and verified by the simulation staff.  Then, the patient was set-up in a stable reproducible  supine position for radiation therapy.  CT images were obtained.  Surface markings were placed.  The CT images were loaded into the planning software.  Then the target and avoidance structures were contoured.  Treatment planning then occurred.  The radiation prescription was entered and confirmed.  Then, I designed and supervised the construction of a total of 7 medically necessary complex treatment devices.  I have requested : 3D Simulation  I have requested a DVH of the following structures: heart, lungs, lumpectomy cavity.  I have ordered:CBC  PLAN:  The patient will receive 50.4 Gy in 28 fractions directed at the left breast, axillary and supraclavicular region followed by a boost to the lumpectomy (2 contiguous) cavities of 10 Gy in 2 fractions.   Optical Surface Tracking Plan:  Since intensity modulated radiotherapy (IMRT) and 3D conformal radiation treatment methods are predicated on accurate and precise positioning for treatment, intrafraction motion monitoring is medically necessary to ensure  accurate and safe treatment delivery.  The ability to quantify intrafraction motion without excessive ionizing radiation dose can only be performed with optical surface tracking. Accordingly, surface imaging offers the opportunity to obtain 3D measurements of patient position throughout IMRT and 3D treatments without excessive radiation exposure.  I am ordering optical surface tracking for this patient's upcoming course of radiotherapy.  Special treatment procedure   was performed today due to the extra time and effort required by myself to plan and prepare this patient for deep inspiration breath hold technique.  I have determined cardiac sparing to be of benefit to this patient to prevent long term cardiac damage due to radiation of the heart.  Bellows were placed on the patient's abdomen. To facilitate cardiac sparing, the patient was coached by the radiation therapists on breath hold techniques and breathing practice was performed. Practice waveforms were obtained. The patient was then scanned while maintaining breath hold in the treatment position.  This image was then transferred over to the imaging specialist. The imaging specialist then created a fusion of the free breathing and breath hold scans using the chest wall as the stable structure. I personally reviewed the fusion in axial, coronal and sagittal image planes.  Excellent cardiac sparing was obtained.  I felt the patient is an appropriate candidate for breath hold and the patient will be treated as such.  The image fusion was then reviewed with the patient to reinforce the necessity of reproducible breath hold.  ________________________________     -----------------------------------  Elizabeth Promise, PhD, MD  This document serves as a record of services personally performed by Elizabeth Sharp  Elizabeth Batta, MD. It was created on his behalf by Elizabeth Sharp, a trained medical scribe. The creation of this record is based on the scribe's personal  observations and the provider's statements to them. This document has been checked and approved by the attending provider.

## 2019-09-29 ENCOUNTER — Telehealth: Payer: Self-pay | Admitting: Hematology and Oncology

## 2019-09-29 ENCOUNTER — Other Ambulatory Visit: Payer: Self-pay

## 2019-09-29 ENCOUNTER — Ambulatory Visit
Admission: RE | Admit: 2019-09-29 | Discharge: 2019-09-29 | Disposition: A | Discharge status: Home / Self Care | Payer: BC Managed Care – PPO | Source: Ambulatory Visit | Attending: Radiation Oncology | Admitting: Radiation Oncology

## 2019-09-29 DIAGNOSIS — Z510 Encounter for antineoplastic radiation therapy: Secondary | ICD-10-CM | POA: Diagnosis not present

## 2019-09-29 DIAGNOSIS — C50212 Malignant neoplasm of upper-inner quadrant of left female breast: Secondary | ICD-10-CM | POA: Insufficient documentation

## 2019-09-29 DIAGNOSIS — Z170 Estrogen receptor positive status [ER+]: Secondary | ICD-10-CM | POA: Diagnosis not present

## 2019-09-29 NOTE — Telephone Encounter (Signed)
Scheduled per los, patient has been called and notified. 

## 2019-10-04 DIAGNOSIS — C50212 Malignant neoplasm of upper-inner quadrant of left female breast: Secondary | ICD-10-CM | POA: Diagnosis not present

## 2019-10-04 DIAGNOSIS — Z170 Estrogen receptor positive status [ER+]: Secondary | ICD-10-CM | POA: Diagnosis not present

## 2019-10-04 DIAGNOSIS — Z510 Encounter for antineoplastic radiation therapy: Secondary | ICD-10-CM | POA: Diagnosis not present

## 2019-10-05 NOTE — Progress Notes (Signed)
  Radiation Oncology         [phone]) [phone] ________________________________  Name: Elizabeth Sharp MRN: [id number]  Date: 10/06/2019  DOB: 29-Dec-1965  Simulation Verification Note    ICD-10-CM   1. Malignant neoplasm of upper-inner quadrant of left breast in female, estrogen receptor positive ([place])  C50.212    Z17.0     NARRATIVE: The patient was brought to the treatment unit and placed in the planned treatment position. The clinical setup was verified. Then port films were obtained and uploaded to the radiation oncology medical record software.  The treatment beams were carefully compared against the planned radiation fields. The position location and shape of the radiation fields was reviewed. They targeted volume of tissue appears to be appropriately covered by the radiation beams. Organs at risk appear to be excluded as planned.  Based on my personal review, I approved the simulation verification. The patient's treatment will proceed as planned.  -----------------------------------  Blair Promise, PhD, MD  This document serves as a record of services personally performed by Gery Pray, MD. It was created on his behalf by Clerance Lav, a trained medical scribe. The creation of this record is based on the scribe's personal observations and the provider's statements to them. This document has been checked and approved by the attending provider.

## 2019-10-06 ENCOUNTER — Telehealth: Payer: Self-pay | Admitting: *Deleted

## 2019-10-06 ENCOUNTER — Ambulatory Visit
Admission: RE | Admit: 2019-10-06 | Discharge: 2019-10-06 | Disposition: A | Discharge status: Home / Self Care | Payer: BC Managed Care – PPO | Source: Ambulatory Visit | Attending: Radiation Oncology | Admitting: Radiation Oncology

## 2019-10-06 ENCOUNTER — Other Ambulatory Visit: Payer: Self-pay

## 2019-10-06 DIAGNOSIS — Z170 Estrogen receptor positive status [ER+]: Secondary | ICD-10-CM

## 2019-10-06 DIAGNOSIS — C50212 Malignant neoplasm of upper-inner quadrant of left female breast: Secondary | ICD-10-CM | POA: Diagnosis not present

## 2019-10-06 DIAGNOSIS — Z510 Encounter for antineoplastic radiation therapy: Secondary | ICD-10-CM | POA: Diagnosis not present

## 2019-10-06 NOTE — Telephone Encounter (Signed)
"  Elizabeth Sharp with [employer] faxed forms to [phone] on 10-03-2019 and today.  Have you received forms."  No forms currently received.  Provided POD 1 fax number close to forms work area [phone]).

## 2019-10-07 ENCOUNTER — Other Ambulatory Visit: Payer: Self-pay

## 2019-10-07 ENCOUNTER — Ambulatory Visit
Admission: RE | Admit: 2019-10-07 | Discharge: 2019-10-07 | Disposition: A | Discharge status: Home / Self Care | Payer: BC Managed Care – PPO | Source: Ambulatory Visit | Attending: Radiation Oncology | Admitting: Radiation Oncology

## 2019-10-07 DIAGNOSIS — Z170 Estrogen receptor positive status [ER+]: Secondary | ICD-10-CM | POA: Diagnosis not present

## 2019-10-07 DIAGNOSIS — Z510 Encounter for antineoplastic radiation therapy: Secondary | ICD-10-CM | POA: Diagnosis not present

## 2019-10-07 DIAGNOSIS — C50212 Malignant neoplasm of upper-inner quadrant of left female breast: Secondary | ICD-10-CM | POA: Diagnosis not present

## 2019-10-07 NOTE — Telephone Encounter (Signed)
[employer] Form received 10/07/2019.

## 2019-10-10 ENCOUNTER — Other Ambulatory Visit: Payer: Self-pay

## 2019-10-10 ENCOUNTER — Ambulatory Visit
Admission: RE | Admit: 2019-10-10 | Discharge: 2019-10-10 | Disposition: A | Discharge status: Home / Self Care | Payer: BC Managed Care – PPO | Source: Ambulatory Visit | Attending: Radiation Oncology | Admitting: Radiation Oncology

## 2019-10-10 DIAGNOSIS — C50212 Malignant neoplasm of upper-inner quadrant of left female breast: Secondary | ICD-10-CM | POA: Insufficient documentation

## 2019-10-10 DIAGNOSIS — Z510 Encounter for antineoplastic radiation therapy: Secondary | ICD-10-CM | POA: Insufficient documentation

## 2019-10-10 DIAGNOSIS — Z170 Estrogen receptor positive status [ER+]: Secondary | ICD-10-CM | POA: Diagnosis not present

## 2019-10-11 ENCOUNTER — Other Ambulatory Visit: Payer: Self-pay | Admitting: Hematology and Oncology

## 2019-10-11 ENCOUNTER — Encounter: Payer: Self-pay | Admitting: *Deleted

## 2019-10-11 ENCOUNTER — Other Ambulatory Visit: Payer: Self-pay

## 2019-10-11 ENCOUNTER — Ambulatory Visit
Admission: RE | Admit: 2019-10-11 | Discharge: 2019-10-11 | Disposition: A | Discharge status: Home / Self Care | Payer: BC Managed Care – PPO | Source: Ambulatory Visit | Attending: Radiation Oncology | Admitting: Radiation Oncology

## 2019-10-11 ENCOUNTER — Inpatient Hospital Stay: Payer: BC Managed Care – PPO | Attending: Hematology and Oncology

## 2019-10-11 ENCOUNTER — Inpatient Hospital Stay: Payer: BC Managed Care – PPO

## 2019-10-11 VITALS — BP 154/93 | HR 98 | Temp 98.3°F | Resp 17

## 2019-10-11 DIAGNOSIS — C50212 Malignant neoplasm of upper-inner quadrant of left female breast: Secondary | ICD-10-CM | POA: Insufficient documentation

## 2019-10-11 DIAGNOSIS — K769 Liver disease, unspecified: Secondary | ICD-10-CM

## 2019-10-11 DIAGNOSIS — Z510 Encounter for antineoplastic radiation therapy: Secondary | ICD-10-CM | POA: Diagnosis not present

## 2019-10-11 DIAGNOSIS — C773 Secondary and unspecified malignant neoplasm of axilla and upper limb lymph nodes: Secondary | ICD-10-CM | POA: Insufficient documentation

## 2019-10-11 DIAGNOSIS — Z5112 Encounter for antineoplastic immunotherapy: Secondary | ICD-10-CM | POA: Diagnosis not present

## 2019-10-11 DIAGNOSIS — Z79899 Other long term (current) drug therapy: Secondary | ICD-10-CM | POA: Insufficient documentation

## 2019-10-11 DIAGNOSIS — Z170 Estrogen receptor positive status [ER+]: Secondary | ICD-10-CM | POA: Insufficient documentation

## 2019-10-11 DIAGNOSIS — R748 Abnormal levels of other serum enzymes: Secondary | ICD-10-CM

## 2019-10-11 DIAGNOSIS — Z9221 Personal history of antineoplastic chemotherapy: Secondary | ICD-10-CM | POA: Diagnosis not present

## 2019-10-11 LAB — CMP (CANCER CENTER ONLY)
ALT: 206 U/L — ABNORMAL HIGH (ref 0–44)
AST: 350 U/L (ref 15–41)
Albumin: 3.8 g/dL (ref 3.5–5.0)
Alkaline Phosphatase: 131 U/L — ABNORMAL HIGH (ref 38–126)
Anion gap: 11 (ref 5–15)
BUN: 11 mg/dL (ref 6–20)
CO2: 25 mmol/L (ref 22–32)
Calcium: 9.9 mg/dL (ref 8.9–10.3)
Chloride: 102 mmol/L (ref 98–111)
Creatinine: 0.7 mg/dL (ref 0.44–1.00)
GFR, Est AFR Am: >60 mL/min (ref >60)
GFR, Estimated: >60 mL/min (ref >60)
Glucose, Bld: 96 mg/dL (ref 70–99)
Potassium: 4.1 mmol/L (ref 3.5–5.1)
Sodium: 138 mmol/L (ref 135–145)
Total Bilirubin: 0.8 mg/dL (ref 0.3–1.2)
Total Protein: 7.9 g/dL (ref 6.5–8.1)

## 2019-10-11 LAB — CBC WITH DIFFERENTIAL (CANCER CENTER ONLY)
Abs Immature Granulocytes: 0.02 10*3/uL (ref 0.00–0.07)
Basophils Absolute: 0.1 10*3/uL (ref 0.0–0.1)
Basophils Relative: 1 %
Eosinophils Absolute: 0.1 10*3/uL (ref 0.0–0.5)
Eosinophils Relative: 3 %
HCT: 37.3 % (ref 36.0–46.0)
Hemoglobin: 11.4 g/dL — ABNORMAL LOW (ref 12.0–15.0)
Immature Granulocytes: 0 %
Lymphocytes Relative: 20 %
Lymphs Abs: 1 10*3/uL (ref 0.7–4.0)
MCH: 27.1 pg (ref 26.0–34.0)
MCHC: 30.6 g/dL (ref 30.0–36.0)
MCV: 88.6 fL (ref 80.0–100.0)
Monocytes Absolute: 0.4 10*3/uL (ref 0.1–1.0)
Monocytes Relative: 9 %
Neutro Abs: 3.2 10*3/uL (ref 1.7–7.7)
Neutrophils Relative %: 67 %
Platelet Count: 306 10*3/uL (ref 150–400)
RBC: 4.21 MIL/uL (ref 3.87–5.11)
RDW: 16.5 % — ABNORMAL HIGH (ref 11.5–15.5)
WBC Count: 4.8 10*3/uL (ref 4.0–10.5)
nRBC: 0 % (ref 0.0–0.2)

## 2019-10-11 MED ORDER — DIPHENHYDRAMINE HCL 25 MG PO CAPS
50.0000 mg | ORAL_CAPSULE | Freq: Once | ORAL | Status: AC
  Administered 2019-10-11: 13:00:00 50 mg via ORAL

## 2019-10-11 MED ORDER — DIPHENHYDRAMINE HCL 25 MG PO CAPS
ORAL_CAPSULE | ORAL | Status: AC
  Filled 2019-10-11: qty 1

## 2019-10-11 MED ORDER — SODIUM CHLORIDE 0.9% FLUSH
10.0000 mL | INTRAVENOUS | Status: DC | PRN
  Administered 2019-10-11: 10 mL
  Filled 2019-10-11: qty 10

## 2019-10-11 MED ORDER — HEPARIN SOD (PORK) LOCK FLUSH 100 UNIT/ML IV SOLN
500.0000 [IU] | Freq: Once | INTRAVENOUS | Status: AC | PRN
  Administered 2019-10-11: 500 [IU]
  Filled 2019-10-11: qty 5

## 2019-10-11 MED ORDER — ACETAMINOPHEN 325 MG PO TABS
650.0000 mg | ORAL_TABLET | Freq: Once | ORAL | Status: AC
  Administered 2019-10-11: 650 mg via ORAL

## 2019-10-11 MED ORDER — ACETAMINOPHEN 325 MG PO TABS
ORAL_TABLET | ORAL | Status: AC
  Filled 2019-10-11: qty 2

## 2019-10-11 MED ORDER — TRASTUZUMAB-DKST CHEMO 150 MG IV SOLR
6.0000 mg/kg | Freq: Once | INTRAVENOUS | Status: AC
  Administered 2019-10-11: 546 mg via INTRAVENOUS
  Filled 2019-10-11: qty 26

## 2019-10-11 MED ORDER — SODIUM CHLORIDE 0.9 % IV SOLN
Freq: Once | INTRAVENOUS | Status: AC
  Filled 2019-10-11: qty 250

## 2019-10-11 NOTE — Patient Instructions (Signed)
[facility] Discharge Instructions for Patients Receiving Chemotherapy  Today you received the following chemotherapy agents trastuzumab.  To help prevent nausea and vomiting after your treatment, we encourage you to take your nausea medication as directed.    If you develop nausea and vomiting that is not controlled by your nausea medication, call the clinic.   BELOW ARE SYMPTOMS THAT SHOULD BE REPORTED IMMEDIATELY:  *FEVER GREATER THAN 100.5 F  *CHILLS WITH OR WITHOUT FEVER  NAUSEA AND VOMITING THAT IS NOT CONTROLLED WITH YOUR NAUSEA MEDICATION  *UNUSUAL SHORTNESS OF BREATH  *UNUSUAL BRUISING OR BLEEDING  TENDERNESS IN MOUTH AND THROAT WITH OR WITHOUT PRESENCE OF ULCERS  *URINARY PROBLEMS  *BOWEL PROBLEMS  UNUSUAL RASH Items with * indicate a potential emergency and should be followed up as soon as possible.  Feel free to call the clinic should you have any questions or concerns. The clinic phone number is [phone].  Please show the CHEMO ALERT CARD at check-in to the Emergency Department and triage nurse.   

## 2019-10-11 NOTE — Progress Notes (Signed)
CRITICAL VALUE STICKER  CRITICAL VALUE: AST 350  MD NOTIFIED: Dr. Nicholas Lose   TIME OF NOTIFICATION: [id number]  RESPONSE:   Ultrasound of liver will be ordered to follow up on AST levels.  Pt notified via infusion nurse.

## 2019-10-12 ENCOUNTER — Ambulatory Visit
Admission: RE | Admit: 2019-10-12 | Discharge: 2019-10-12 | Disposition: A | Discharge status: Home / Self Care | Payer: BC Managed Care – PPO | Source: Ambulatory Visit | Attending: Radiation Oncology | Admitting: Radiation Oncology

## 2019-10-12 ENCOUNTER — Telehealth: Payer: Self-pay | Admitting: Hematology and Oncology

## 2019-10-12 ENCOUNTER — Other Ambulatory Visit: Payer: Self-pay

## 2019-10-12 DIAGNOSIS — Z170 Estrogen receptor positive status [ER+]: Secondary | ICD-10-CM | POA: Diagnosis not present

## 2019-10-12 DIAGNOSIS — C50212 Malignant neoplasm of upper-inner quadrant of left female breast: Secondary | ICD-10-CM | POA: Diagnosis not present

## 2019-10-12 DIAGNOSIS — Z510 Encounter for antineoplastic radiation therapy: Secondary | ICD-10-CM | POA: Diagnosis not present

## 2019-10-12 NOTE — Progress Notes (Signed)
LATE ENTRY from 5/4 at 1315-Critical value called to me of AST 350 and reported to Katheren Puller, RN supporting Dr. Lindi Adie.  Gardiner Rhyme, RN

## 2019-10-12 NOTE — Telephone Encounter (Signed)
R/s appt per 5/4 sch message - unable to reach pt - left message with appt date and time

## 2019-10-13 ENCOUNTER — Other Ambulatory Visit: Payer: Self-pay

## 2019-10-13 ENCOUNTER — Ambulatory Visit
Admission: RE | Admit: 2019-10-13 | Discharge: 2019-10-13 | Disposition: A | Discharge status: Home / Self Care | Payer: BC Managed Care – PPO | Source: Ambulatory Visit | Attending: Radiation Oncology | Admitting: Radiation Oncology

## 2019-10-13 DIAGNOSIS — C50212 Malignant neoplasm of upper-inner quadrant of left female breast: Secondary | ICD-10-CM | POA: Diagnosis not present

## 2019-10-13 DIAGNOSIS — Z170 Estrogen receptor positive status [ER+]: Secondary | ICD-10-CM | POA: Diagnosis not present

## 2019-10-13 DIAGNOSIS — Z510 Encounter for antineoplastic radiation therapy: Secondary | ICD-10-CM | POA: Diagnosis not present

## 2019-10-14 ENCOUNTER — Ambulatory Visit
Admission: RE | Admit: 2019-10-14 | Discharge: 2019-10-14 | Disposition: A | Discharge status: Home / Self Care | Payer: BC Managed Care – PPO | Source: Ambulatory Visit | Attending: Radiation Oncology | Admitting: Radiation Oncology

## 2019-10-14 ENCOUNTER — Other Ambulatory Visit: Payer: Self-pay

## 2019-10-14 ENCOUNTER — Telehealth: Payer: Self-pay | Admitting: *Deleted

## 2019-10-14 DIAGNOSIS — C50212 Malignant neoplasm of upper-inner quadrant of left female breast: Secondary | ICD-10-CM | POA: Diagnosis not present

## 2019-10-14 DIAGNOSIS — Z170 Estrogen receptor positive status [ER+]: Secondary | ICD-10-CM | POA: Diagnosis not present

## 2019-10-14 DIAGNOSIS — Z510 Encounter for antineoplastic radiation therapy: Secondary | ICD-10-CM | POA: Diagnosis not present

## 2019-10-14 NOTE — Telephone Encounter (Signed)
Connected with Thurnell Lose [phone]).  Notified forms received by this nurse 10/07/2019 were completed yesterday.  Will fax upon return with provider signature after review and fax to [employer]. Denies further current questions or needs.

## 2019-10-14 NOTE — Telephone Encounter (Signed)
-----   Message from De Burrs, RN sent at 10/11/2019  5:10 PM EDT ----- Regarding: [employer] This patient reports while at her visit today that [employer] has not received the information requested. Ms. Lunz gave a fax # for the [employer] contact person Casimer Bilis of [phone]. Patient advised will send over this information to the person working on her request.   Thank you,  Barbra Sarks RN for Dr. Sondra Come

## 2019-10-17 ENCOUNTER — Other Ambulatory Visit: Payer: Self-pay

## 2019-10-17 ENCOUNTER — Ambulatory Visit
Admission: RE | Admit: 2019-10-17 | Discharge: 2019-10-17 | Disposition: A | Discharge status: Home / Self Care | Payer: BC Managed Care – PPO | Source: Ambulatory Visit | Attending: Radiation Oncology | Admitting: Radiation Oncology

## 2019-10-17 DIAGNOSIS — C50212 Malignant neoplasm of upper-inner quadrant of left female breast: Secondary | ICD-10-CM | POA: Diagnosis not present

## 2019-10-17 DIAGNOSIS — Z170 Estrogen receptor positive status [ER+]: Secondary | ICD-10-CM | POA: Diagnosis not present

## 2019-10-17 DIAGNOSIS — Z510 Encounter for antineoplastic radiation therapy: Secondary | ICD-10-CM | POA: Diagnosis not present

## 2019-10-18 ENCOUNTER — Ambulatory Visit (HOSPITAL_COMMUNITY)
Admission: RE | Admit: 2019-10-18 | Discharge: 2019-10-18 | Disposition: A | Discharge status: Home / Self Care | Payer: BC Managed Care – PPO | Source: Ambulatory Visit | Attending: Hematology and Oncology | Admitting: Hematology and Oncology

## 2019-10-18 ENCOUNTER — Other Ambulatory Visit: Payer: Self-pay

## 2019-10-18 ENCOUNTER — Ambulatory Visit
Admission: RE | Admit: 2019-10-18 | Discharge: 2019-10-18 | Disposition: A | Discharge status: Home / Self Care | Payer: BC Managed Care – PPO | Source: Ambulatory Visit | Attending: Radiation Oncology | Admitting: Radiation Oncology

## 2019-10-18 DIAGNOSIS — Z170 Estrogen receptor positive status [ER+]: Secondary | ICD-10-CM | POA: Diagnosis not present

## 2019-10-18 DIAGNOSIS — Z510 Encounter for antineoplastic radiation therapy: Secondary | ICD-10-CM | POA: Diagnosis not present

## 2019-10-18 DIAGNOSIS — K769 Liver disease, unspecified: Secondary | ICD-10-CM | POA: Diagnosis not present

## 2019-10-18 DIAGNOSIS — R748 Abnormal levels of other serum enzymes: Secondary | ICD-10-CM | POA: Diagnosis not present

## 2019-10-18 DIAGNOSIS — C50212 Malignant neoplasm of upper-inner quadrant of left female breast: Secondary | ICD-10-CM | POA: Diagnosis not present

## 2019-10-18 DIAGNOSIS — K760 Fatty (change of) liver, not elsewhere classified: Secondary | ICD-10-CM | POA: Diagnosis not present

## 2019-10-19 ENCOUNTER — Ambulatory Visit
Admission: RE | Admit: 2019-10-19 | Discharge: 2019-10-19 | Disposition: A | Discharge status: Home / Self Care | Payer: BC Managed Care – PPO | Source: Ambulatory Visit | Attending: Radiation Oncology | Admitting: Radiation Oncology

## 2019-10-19 ENCOUNTER — Other Ambulatory Visit: Payer: Self-pay

## 2019-10-19 ENCOUNTER — Encounter: Payer: Self-pay | Admitting: *Deleted

## 2019-10-19 DIAGNOSIS — Z510 Encounter for antineoplastic radiation therapy: Secondary | ICD-10-CM | POA: Diagnosis not present

## 2019-10-19 DIAGNOSIS — C50212 Malignant neoplasm of upper-inner quadrant of left female breast: Secondary | ICD-10-CM | POA: Diagnosis not present

## 2019-10-19 DIAGNOSIS — Z170 Estrogen receptor positive status [ER+]: Secondary | ICD-10-CM | POA: Diagnosis not present

## 2019-10-20 ENCOUNTER — Ambulatory Visit
Admission: RE | Admit: 2019-10-20 | Discharge: 2019-10-20 | Disposition: A | Discharge status: Home / Self Care | Payer: BC Managed Care – PPO | Source: Ambulatory Visit | Attending: Radiation Oncology | Admitting: Radiation Oncology

## 2019-10-20 ENCOUNTER — Other Ambulatory Visit: Payer: Self-pay

## 2019-10-20 DIAGNOSIS — C50212 Malignant neoplasm of upper-inner quadrant of left female breast: Secondary | ICD-10-CM | POA: Diagnosis not present

## 2019-10-20 DIAGNOSIS — Z170 Estrogen receptor positive status [ER+]: Secondary | ICD-10-CM | POA: Diagnosis not present

## 2019-10-20 DIAGNOSIS — Z510 Encounter for antineoplastic radiation therapy: Secondary | ICD-10-CM | POA: Diagnosis not present

## 2019-10-21 ENCOUNTER — Ambulatory Visit
Admission: RE | Admit: 2019-10-21 | Discharge: 2019-10-21 | Disposition: A | Discharge status: Home / Self Care | Payer: BC Managed Care – PPO | Source: Ambulatory Visit | Attending: Radiation Oncology | Admitting: Radiation Oncology

## 2019-10-21 ENCOUNTER — Other Ambulatory Visit: Payer: Self-pay

## 2019-10-21 DIAGNOSIS — Z510 Encounter for antineoplastic radiation therapy: Secondary | ICD-10-CM | POA: Diagnosis not present

## 2019-10-21 DIAGNOSIS — C50212 Malignant neoplasm of upper-inner quadrant of left female breast: Secondary | ICD-10-CM | POA: Diagnosis not present

## 2019-10-21 DIAGNOSIS — Z170 Estrogen receptor positive status [ER+]: Secondary | ICD-10-CM

## 2019-10-21 MED ORDER — ALRA NON-METALLIC DEODORANT (RAD-ONC)
1.0000 "application " | Freq: Once | TOPICAL | Status: AC
  Administered 2019-10-21: 1 via TOPICAL

## 2019-10-21 MED ORDER — SONAFINE EX EMUL
1.0000 "application " | Freq: Two times a day (BID) | CUTANEOUS | Status: DC
  Administered 2019-10-21: 1 via TOPICAL

## 2019-10-24 ENCOUNTER — Ambulatory Visit
Admission: RE | Admit: 2019-10-24 | Discharge: 2019-10-24 | Disposition: A | Discharge status: Home / Self Care | Payer: BC Managed Care – PPO | Source: Ambulatory Visit | Attending: Radiation Oncology | Admitting: Radiation Oncology

## 2019-10-24 ENCOUNTER — Other Ambulatory Visit: Payer: Self-pay

## 2019-10-24 DIAGNOSIS — Z510 Encounter for antineoplastic radiation therapy: Secondary | ICD-10-CM | POA: Diagnosis not present

## 2019-10-24 DIAGNOSIS — C50212 Malignant neoplasm of upper-inner quadrant of left female breast: Secondary | ICD-10-CM | POA: Diagnosis not present

## 2019-10-24 DIAGNOSIS — Z170 Estrogen receptor positive status [ER+]: Secondary | ICD-10-CM | POA: Diagnosis not present

## 2019-10-25 ENCOUNTER — Other Ambulatory Visit: Payer: Self-pay

## 2019-10-25 ENCOUNTER — Other Ambulatory Visit: Payer: BC Managed Care – PPO

## 2019-10-25 ENCOUNTER — Ambulatory Visit: Payer: BC Managed Care – PPO | Admitting: Hematology and Oncology

## 2019-10-25 ENCOUNTER — Ambulatory Visit
Admission: RE | Admit: 2019-10-25 | Discharge: 2019-10-25 | Disposition: A | Discharge status: Home / Self Care | Payer: BC Managed Care – PPO | Source: Ambulatory Visit | Attending: Radiation Oncology | Admitting: Radiation Oncology

## 2019-10-25 DIAGNOSIS — C50212 Malignant neoplasm of upper-inner quadrant of left female breast: Secondary | ICD-10-CM | POA: Diagnosis not present

## 2019-10-25 DIAGNOSIS — Z510 Encounter for antineoplastic radiation therapy: Secondary | ICD-10-CM | POA: Diagnosis not present

## 2019-10-25 DIAGNOSIS — Z170 Estrogen receptor positive status [ER+]: Secondary | ICD-10-CM | POA: Diagnosis not present

## 2019-10-26 ENCOUNTER — Other Ambulatory Visit: Payer: Self-pay

## 2019-10-26 ENCOUNTER — Ambulatory Visit
Admission: RE | Admit: 2019-10-26 | Discharge: 2019-10-26 | Disposition: A | Discharge status: Home / Self Care | Payer: BC Managed Care – PPO | Source: Ambulatory Visit | Attending: Radiation Oncology | Admitting: Radiation Oncology

## 2019-10-26 DIAGNOSIS — Z510 Encounter for antineoplastic radiation therapy: Secondary | ICD-10-CM | POA: Diagnosis not present

## 2019-10-26 DIAGNOSIS — C50212 Malignant neoplasm of upper-inner quadrant of left female breast: Secondary | ICD-10-CM | POA: Diagnosis not present

## 2019-10-26 DIAGNOSIS — Z170 Estrogen receptor positive status [ER+]: Secondary | ICD-10-CM | POA: Diagnosis not present

## 2019-10-27 ENCOUNTER — Encounter: Payer: Self-pay | Admitting: Hematology and Oncology

## 2019-10-27 ENCOUNTER — Other Ambulatory Visit: Payer: Self-pay

## 2019-10-27 ENCOUNTER — Ambulatory Visit
Admission: RE | Admit: 2019-10-27 | Discharge: 2019-10-27 | Disposition: A | Discharge status: Home / Self Care | Payer: BC Managed Care – PPO | Source: Ambulatory Visit | Attending: Radiation Oncology | Admitting: Radiation Oncology

## 2019-10-27 DIAGNOSIS — Z170 Estrogen receptor positive status [ER+]: Secondary | ICD-10-CM | POA: Diagnosis not present

## 2019-10-27 DIAGNOSIS — C50212 Malignant neoplasm of upper-inner quadrant of left female breast: Secondary | ICD-10-CM | POA: Diagnosis not present

## 2019-10-27 DIAGNOSIS — Z510 Encounter for antineoplastic radiation therapy: Secondary | ICD-10-CM | POA: Diagnosis not present

## 2019-10-28 ENCOUNTER — Other Ambulatory Visit: Payer: Self-pay

## 2019-10-28 ENCOUNTER — Ambulatory Visit
Admission: RE | Admit: 2019-10-28 | Discharge: 2019-10-28 | Disposition: A | Discharge status: Home / Self Care | Payer: BC Managed Care – PPO | Source: Ambulatory Visit | Attending: Radiation Oncology | Admitting: Radiation Oncology

## 2019-10-28 DIAGNOSIS — Z510 Encounter for antineoplastic radiation therapy: Secondary | ICD-10-CM | POA: Diagnosis not present

## 2019-10-28 DIAGNOSIS — C50212 Malignant neoplasm of upper-inner quadrant of left female breast: Secondary | ICD-10-CM | POA: Diagnosis not present

## 2019-10-28 DIAGNOSIS — Z170 Estrogen receptor positive status [ER+]: Secondary | ICD-10-CM | POA: Diagnosis not present

## 2019-10-31 ENCOUNTER — Ambulatory Visit
Admission: RE | Admit: 2019-10-31 | Discharge: 2019-10-31 | Disposition: A | Discharge status: Home / Self Care | Payer: BC Managed Care – PPO | Source: Ambulatory Visit | Attending: Radiation Oncology | Admitting: Radiation Oncology

## 2019-10-31 ENCOUNTER — Other Ambulatory Visit: Payer: Self-pay

## 2019-10-31 DIAGNOSIS — Z170 Estrogen receptor positive status [ER+]: Secondary | ICD-10-CM | POA: Diagnosis not present

## 2019-10-31 DIAGNOSIS — Z510 Encounter for antineoplastic radiation therapy: Secondary | ICD-10-CM | POA: Diagnosis not present

## 2019-10-31 DIAGNOSIS — C50212 Malignant neoplasm of upper-inner quadrant of left female breast: Secondary | ICD-10-CM | POA: Diagnosis not present

## 2019-10-31 NOTE — Progress Notes (Signed)
Patient Care Team: Rochel Brome, MD as PCP - General (Family Medicine) Rockwell Germany, RN as Oncology Nurse Navigator Mauro Kaufmann, RN as Oncology Nurse Navigator  DIAGNOSIS:    ICD-10-CM   1. Malignant neoplasm of upper-inner quadrant of left breast in female, estrogen receptor positive ([place])  C50.212    Z17.0     SUMMARY OF ONCOLOGIC HISTORY: Oncology History  Malignant neoplasm of upper-inner quadrant of left breast in female, estrogen receptor positive ([place])  04/28/2019 Initial Diagnosis   Palpable left breast lump.  Mammogram 04/21/19 showed a 4.5cm mass at the 11:30 position with smaller adjacent solid masses and one abnormal left axillary lymph node. 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   05/10/2019 Cancer Staging   Staging form: Breast, AJCC 8th Edition - Clinical stage from 05/10/2019: Stage IIB (cT2, cN1, cM0, G3, ER+, PR+, HER2-) - Signed by Nicholas Lose, MD on 05/10/2019   05/19/2019 - 08/19/2019 Chemotherapy   The patient had palonosetron (ALOXI) injection 0.25 mg, 0.25 mg, Intravenous,  Once, 4 of 4 cycles Administration: 0.25 mg (05/19/2019), 0.25 mg (06/09/2019), 0.25 mg (07/08/2019), 0.25 mg (07/30/2019) pegfilgrastim-jmdb (FULPHILA) injection 6 mg, 6 mg, Subcutaneous,  Once, 4 of 4 cycles Administration: 6 mg (05/20/2019), 6 mg (06/11/2019), 6 mg (07/11/2019), 6 mg (08/01/2019) cyclophosphamide (CYTOXAN) 1,280 mg in sodium chloride 0.9 % 250 mL chemo infusion, 600 mg/m2 = 1,280 mg, Intravenous,  Once, 4 of 4 cycles Dose modification: 400 mg/m2 (original dose 600 mg/m2, Cycle 3, Reason: Dose not tolerated) Administration: 1,280 mg (05/19/2019), 1,280 mg (06/09/2019), 840 mg (07/08/2019), 840 mg (07/30/2019) DOCEtaxel (TAXOTERE) 160 mg in sodium chloride 0.9 % 250 mL chemo infusion, 75 mg/m2 = 160 mg, Intravenous,  Once, 4 of 4 cycles Dose modification: 50 mg/m2 (original  dose 75 mg/m2, Cycle 3, Reason: Dose not tolerated) Administration: 160 mg (05/19/2019), 160 mg (06/09/2019), 110 mg (07/08/2019), 110 mg (07/30/2019) fosaprepitant (EMEND) 150 mg in sodium chloride 0.9 % 145 mL IVPB, , Intravenous,  Once, 4 of 4 cycles Administration:  (05/19/2019),  (06/09/2019),  (07/08/2019),  (07/30/2019)  for chemotherapy treatment.    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    09/20/2019 -  Chemotherapy   The patient had trastuzumab-dkst (OGIVRI) 714 mg in sodium chloride 0.9 % 250 mL chemo infusion, 8 mg/kg = 714 mg, Intravenous,  Once, 2 of 17 cycles Administration: 714 mg (09/20/2019), 546 mg (10/11/2019)  for chemotherapy treatment.    10/07/2019 -  Radiation Therapy   Adjuvant radiation     CHIEF COMPLIANT: Herceptin maintenance   INTERVAL HISTORY: Elizabeth Sharp is a 54 y.o. with above-mentioned history of left breast cancerwho completedneoadjuvant chemotherapy, underwent a left lumpectomy, and is currently on radiation therapy and receiving adjuvant Herceptin. Echo on 09/16/19 showed an ejection fraction of 55-60%. Abdominal US on 10/18/19 showed fatty liver without focal mass or other abnormalities. She presents to the clinic today for treatment.   ALLERGIES:  has No Known Allergies.  MEDICATIONS:  Current Outpatient Medications  Medication Sig Dispense Refill  . doxycycline (ADOXA) 50 MG tablet Take 50 mg by mouth 2 (two) times daily.    Marland Kitchen oxyCODONE (OXY IR/ROXICODONE) 5 MG immediate release tablet Take 1 tablet (5 mg total) by mouth every 6 (six) hours as needed for  severe pain. (Patient not taking: Reported on 09/15/2019) 15 tablet 0   No current facility-administered medications for this visit.    PHYSICAL EXAMINATION: ECOG PERFORMANCE STATUS: 1 - Symptomatic but completely ambulatory  Vitals:   11/01/19 1106  BP: (!) 147/74  Pulse:  83  Resp: 20  Temp: 98.5 F (36.9 C)  SpO2: 100%   Filed Weights   11/01/19 1106  Weight: 193 lb 9.6 oz (87.8 kg)    LABORATORY DATA:  I have reviewed the data as listed CMP Latest Ref Rng & Units 10/11/2019 07/29/2019 07/08/2019  Glucose 70 - 99 mg/dL 96 101(H) 90  BUN 6 - 20 mg/dL '11 10 11  '$ Creatinine 0.44 - 1.00 mg/dL 0.70 0.65 0.68  Sodium 135 - 145 mmol/L 138 136 138  Potassium 3.5 - 5.1 mmol/L 4.1 3.8 3.6  Chloride 98 - 111 mmol/L 102 104 104  CO2 22 - 32 mmol/L '25 23 26  '$ Calcium 8.9 - 10.3 mg/dL 9.9 9.1 9.4  Total Protein 6.5 - 8.1 g/dL 7.9 7.4 7.2  Total Bilirubin 0.3 - 1.2 mg/dL 0.8 0.6 0.8  Alkaline Phos 38 - 126 U/L 131(H) 101 101  AST 15 - 41 U/L 350(HH) 122(H) 132(H)  ALT 0 - 44 U/L 206(H) 85(H) 169(H)    Lab Results  Component Value Date   WBC 4.8 10/11/2019   HGB 11.4 (L) 10/11/2019   HCT 37.3 10/11/2019   MCV 88.6 10/11/2019   PLT 306 10/11/2019   NEUTROABS 3.2 10/11/2019    ASSESSMENT & PLAN:  Malignant neoplasm of upper-inner quadrant of left breast in female, estrogen receptor positive ([place]) 04/28/2019: Palpable left breast lump, mammogram 4.5 cm mass at 11:30 position and adjacent smaller masses, 1 abnormal left axillary lymph node: Biopsy of breast and axilla both positive for grade 3 IDC ER 95%, PR 90%, Ki-67 70%, HER-2 negative T2N1 stage IIb clinical stage  Recommendation: 1.Neoadjuvant chemotherapywith Taxotere and Cytoxan every 3 weeks x4 completed 07/30/2019 2.Breast conserving surgery with targeted node dissection 08/30/2019 3.Adjuvant radiation therapy 4.Followed by adjuvant antiestrogen therapy --------------------------------------------------------------------------------------------------------------------------------------------------------- Post neoadjuvant breast MRI 08/05/2019: Left breast cancer previously 3.8 cm is now 2.5 cm now ill-defined and less confluent. Retroareolar mass 1.3 cm is now minimal enhancement. Left axilla  lymph node abnormal  Left lumpectomy 08/30/2019: Left upper outer quadrant: Grade 3 IDC 1.5 cm with high-grade DCIS, margins negative Left lateral lumpectomy: Grade 3 IDC microscopic focus margins negative, 1/3 lymph nodes positive Repeat prognostic panel: ER 50% moderate, PR 10% strong, Ki-67 2%, HER-2 equivocal by IHC FISH positive for HER-2 ratio 3.5 and copy #5.55.  (Heterogeneity in HER-2 expression)  Treatment plan: 1.  Adjuvant Herceptin started 09/20/2019 2.  Adjuvant radiation started 10/07/2019 3.  Followed by adjuvant antiestrogen therapy. ----------------------------------------------------------------------------------------------------------------------------- Current treatment: Adjuvant Herceptin every [redacted] weeks along with adjuvant radiation Herceptin toxicities: None Patient is having radiation dermatitis but she appears to be tolerating it well.  Radiation will be completed 11/23/2019. Return to clinic every 3 weeks for Herceptin every 6 weeks for follow-up with me.    No orders of the defined types were placed in this encounter.  The patient has a good understanding of the overall plan. she agrees with it. she will call with any problems that may develop before the next visit here.  Total time spent: 30 mins including face to face time and time spent for planning, charting and coordination of care  Nicholas Lose, MD 11/01/2019  I, Cloyde Reams Dorshimer, am acting as scribe for Dr.  Nicholas Lose.  I have reviewed the above documentation for accuracy and completeness, and I agree with the above.

## 2019-11-01 ENCOUNTER — Other Ambulatory Visit: Payer: BC Managed Care – PPO

## 2019-11-01 ENCOUNTER — Inpatient Hospital Stay: Payer: BC Managed Care – PPO

## 2019-11-01 ENCOUNTER — Other Ambulatory Visit: Payer: Self-pay

## 2019-11-01 ENCOUNTER — Ambulatory Visit: Payer: BC Managed Care – PPO | Admitting: Hematology and Oncology

## 2019-11-01 ENCOUNTER — Ambulatory Visit
Admission: RE | Admit: 2019-11-01 | Discharge: 2019-11-01 | Disposition: A | Discharge status: Home / Self Care | Payer: BC Managed Care – PPO | Source: Ambulatory Visit | Attending: Radiation Oncology | Admitting: Radiation Oncology

## 2019-11-01 ENCOUNTER — Ambulatory Visit: Payer: BC Managed Care – PPO

## 2019-11-01 ENCOUNTER — Encounter: Payer: Self-pay | Admitting: Hematology and Oncology

## 2019-11-01 ENCOUNTER — Inpatient Hospital Stay: Payer: BC Managed Care – PPO | Admitting: Hematology and Oncology

## 2019-11-01 DIAGNOSIS — Z79899 Other long term (current) drug therapy: Secondary | ICD-10-CM | POA: Diagnosis not present

## 2019-11-01 DIAGNOSIS — Z9221 Personal history of antineoplastic chemotherapy: Secondary | ICD-10-CM | POA: Diagnosis not present

## 2019-11-01 DIAGNOSIS — Z170 Estrogen receptor positive status [ER+]: Secondary | ICD-10-CM

## 2019-11-01 DIAGNOSIS — C50212 Malignant neoplasm of upper-inner quadrant of left female breast: Secondary | ICD-10-CM | POA: Diagnosis not present

## 2019-11-01 DIAGNOSIS — C773 Secondary and unspecified malignant neoplasm of axilla and upper limb lymph nodes: Secondary | ICD-10-CM | POA: Diagnosis not present

## 2019-11-01 DIAGNOSIS — Z5112 Encounter for antineoplastic immunotherapy: Secondary | ICD-10-CM | POA: Diagnosis not present

## 2019-11-01 DIAGNOSIS — Z510 Encounter for antineoplastic radiation therapy: Secondary | ICD-10-CM | POA: Diagnosis not present

## 2019-11-01 LAB — CMP (CANCER CENTER ONLY)
ALT: 129 U/L — ABNORMAL HIGH (ref 0–44)
AST: 196 U/L (ref 15–41)
Albumin: 3.8 g/dL (ref 3.5–5.0)
Alkaline Phosphatase: 114 U/L (ref 38–126)
Anion gap: 8 (ref 5–15)
BUN: 10 mg/dL (ref 6–20)
CO2: 25 mmol/L (ref 22–32)
Calcium: 9.9 mg/dL (ref 8.9–10.3)
Chloride: 104 mmol/L (ref 98–111)
Creatinine: 0.8 mg/dL (ref 0.44–1.00)
GFR, Est AFR Am: >60 mL/min (ref >60)
GFR, Estimated: >60 mL/min (ref >60)
Glucose, Bld: 107 mg/dL — ABNORMAL HIGH (ref 70–99)
Potassium: 4.4 mmol/L (ref 3.5–5.1)
Sodium: 137 mmol/L (ref 135–145)
Total Bilirubin: 1 mg/dL (ref 0.3–1.2)
Total Protein: 7.9 g/dL (ref 6.5–8.1)

## 2019-11-01 LAB — CBC WITH DIFFERENTIAL (CANCER CENTER ONLY)
Abs Immature Granulocytes: 0.01 10*3/uL (ref 0.00–0.07)
Basophils Absolute: 0 10*3/uL (ref 0.0–0.1)
Basophils Relative: 1 %
Eosinophils Absolute: 0.1 10*3/uL (ref 0.0–0.5)
Eosinophils Relative: 3 %
HCT: 37.7 % (ref 36.0–46.0)
Hemoglobin: 11.5 g/dL — ABNORMAL LOW (ref 12.0–15.0)
Immature Granulocytes: 0 %
Lymphocytes Relative: 20 %
Lymphs Abs: 0.8 10*3/uL (ref 0.7–4.0)
MCH: 26.9 pg (ref 26.0–34.0)
MCHC: 30.5 g/dL (ref 30.0–36.0)
MCV: 88.1 fL (ref 80.0–100.0)
Monocytes Absolute: 0.4 10*3/uL (ref 0.1–1.0)
Monocytes Relative: 9 %
Neutro Abs: 2.8 10*3/uL (ref 1.7–7.7)
Neutrophils Relative %: 67 %
Platelet Count: 264 10*3/uL (ref 150–400)
RBC: 4.28 MIL/uL (ref 3.87–5.11)
RDW: 16.2 % — ABNORMAL HIGH (ref 11.5–15.5)
WBC Count: 4.2 10*3/uL (ref 4.0–10.5)
nRBC: 0 % (ref 0.0–0.2)

## 2019-11-01 MED ORDER — DIPHENHYDRAMINE HCL 25 MG PO CAPS
ORAL_CAPSULE | ORAL | Status: AC
  Filled 2019-11-01: qty 2

## 2019-11-01 MED ORDER — ACETAMINOPHEN 325 MG PO TABS
650.0000 mg | ORAL_TABLET | Freq: Once | ORAL | Status: AC
  Administered 2019-11-01: 650 mg via ORAL

## 2019-11-01 MED ORDER — TRASTUZUMAB-DKST CHEMO 150 MG IV SOLR
6.0000 mg/kg | Freq: Once | INTRAVENOUS | Status: AC
  Administered 2019-11-01: 546 mg via INTRAVENOUS
  Filled 2019-11-01: qty 26

## 2019-11-01 MED ORDER — DIPHENHYDRAMINE HCL 25 MG PO CAPS
50.0000 mg | ORAL_CAPSULE | Freq: Once | ORAL | Status: AC
  Administered 2019-11-01: 50 mg via ORAL

## 2019-11-01 MED ORDER — ACETAMINOPHEN 325 MG PO TABS
ORAL_TABLET | ORAL | Status: AC
  Filled 2019-11-01: qty 2

## 2019-11-01 MED ORDER — HEPARIN SOD (PORK) LOCK FLUSH 100 UNIT/ML IV SOLN
500.0000 [IU] | Freq: Once | INTRAVENOUS | Status: AC | PRN
  Administered 2019-11-01: 500 [IU]
  Filled 2019-11-01: qty 5

## 2019-11-01 MED ORDER — SODIUM CHLORIDE 0.9% FLUSH
10.0000 mL | INTRAVENOUS | Status: DC | PRN
  Administered 2019-11-01: 10 mL
  Filled 2019-11-01: qty 10

## 2019-11-01 MED ORDER — SODIUM CHLORIDE 0.9 % IV SOLN
Freq: Once | INTRAVENOUS | Status: AC
  Filled 2019-11-01: qty 250

## 2019-11-01 NOTE — Assessment & Plan Note (Signed)
04/28/2019: Palpable left breast lump, mammogram 4.5 cm mass at 11:30 position and adjacent smaller masses, 1 abnormal left axillary lymph node: Biopsy of breast and axilla both positive for grade 3 IDC ER 95%, PR 90%, Ki-67 70%, HER-2 negative T2N1 stage IIb clinical stage  Recommendation: 1.Neoadjuvant chemotherapywith Taxotere and Cytoxan every 3 weeks x4 completed 07/30/2019 2.Breast conserving surgery with targeted node dissection 08/30/2019 3.Adjuvant radiation therapy 4.Followed by adjuvant antiestrogen therapy --------------------------------------------------------------------------------------------------------------------------------------------------------- Post neoadjuvant breast MRI 08/05/2019: Left breast cancer previously 3.8 cm is now 2.5 cm now ill-defined and less confluent. Retroareolar mass 1.3 cm is now minimal enhancement. Left axilla lymph node abnormal  Left lumpectomy 08/30/2019: Left upper outer quadrant: Grade 3 IDC 1.5 cm with high-grade DCIS, margins negative Left lateral lumpectomy: Grade 3 IDC microscopic focus margins negative, 1/3 lymph nodes positive Repeat prognostic panel: ER 50% moderate, PR 10% strong, Ki-67 2%, HER-2 equivocal by IHC FISH positive for HER-2 ratio 3.5 and copy #5.55.  (Heterogeneity in HER-2 expression)  Treatment plan: 1.  Adjuvant Herceptin started 09/20/2019 2.  Adjuvant radiation started 10/07/2019 3.  Followed by adjuvant antiestrogen therapy. ----------------------------------------------------------------------------------------------------------------------------- Current treatment: Adjuvant Herceptin every [redacted] weeks along with adjuvant radiation Herceptin toxicities: None  Return to clinic every 3 weeks for Herceptin every 6 weeks for follow-up with me.

## 2019-11-01 NOTE — Patient Instructions (Signed)
[facility] Discharge Instructions for Patients Receiving Chemotherapy  Today you received the following chemotherapy agents trastuzumab.  To help prevent nausea and vomiting after your treatment, we encourage you to take your nausea medication as directed.    If you develop nausea and vomiting that is not controlled by your nausea medication, call the clinic.   BELOW ARE SYMPTOMS THAT SHOULD BE REPORTED IMMEDIATELY:  *FEVER GREATER THAN 100.5 F  *CHILLS WITH OR WITHOUT FEVER  NAUSEA AND VOMITING THAT IS NOT CONTROLLED WITH YOUR NAUSEA MEDICATION  *UNUSUAL SHORTNESS OF BREATH  *UNUSUAL BRUISING OR BLEEDING  TENDERNESS IN MOUTH AND THROAT WITH OR WITHOUT PRESENCE OF ULCERS  *URINARY PROBLEMS  *BOWEL PROBLEMS  UNUSUAL RASH Items with * indicate a potential emergency and should be followed up as soon as possible.  Feel free to call the clinic should you have any questions or concerns. The clinic phone number is [phone].  Please show the CHEMO ALERT CARD at check-in to the Emergency Department and triage nurse.   

## 2019-11-02 ENCOUNTER — Other Ambulatory Visit: Payer: Self-pay

## 2019-11-02 ENCOUNTER — Ambulatory Visit
Admission: RE | Admit: 2019-11-02 | Discharge: 2019-11-02 | Disposition: A | Discharge status: Home / Self Care | Payer: BC Managed Care – PPO | Source: Ambulatory Visit | Attending: Radiation Oncology | Admitting: Radiation Oncology

## 2019-11-02 DIAGNOSIS — Z510 Encounter for antineoplastic radiation therapy: Secondary | ICD-10-CM | POA: Diagnosis not present

## 2019-11-02 DIAGNOSIS — Z170 Estrogen receptor positive status [ER+]: Secondary | ICD-10-CM | POA: Diagnosis not present

## 2019-11-02 DIAGNOSIS — C50212 Malignant neoplasm of upper-inner quadrant of left female breast: Secondary | ICD-10-CM | POA: Diagnosis not present

## 2019-11-03 ENCOUNTER — Other Ambulatory Visit: Payer: Self-pay

## 2019-11-03 ENCOUNTER — Ambulatory Visit
Admission: RE | Admit: 2019-11-03 | Discharge: 2019-11-03 | Disposition: A | Discharge status: Home / Self Care | Payer: BC Managed Care – PPO | Source: Ambulatory Visit | Attending: Radiation Oncology | Admitting: Radiation Oncology

## 2019-11-03 DIAGNOSIS — C50212 Malignant neoplasm of upper-inner quadrant of left female breast: Secondary | ICD-10-CM | POA: Diagnosis not present

## 2019-11-03 DIAGNOSIS — Z510 Encounter for antineoplastic radiation therapy: Secondary | ICD-10-CM | POA: Diagnosis not present

## 2019-11-03 DIAGNOSIS — Z170 Estrogen receptor positive status [ER+]: Secondary | ICD-10-CM | POA: Diagnosis not present

## 2019-11-04 ENCOUNTER — Other Ambulatory Visit: Payer: Self-pay

## 2019-11-04 ENCOUNTER — Ambulatory Visit
Admission: RE | Admit: 2019-11-04 | Discharge: 2019-11-04 | Disposition: A | Discharge status: Home / Self Care | Payer: BC Managed Care – PPO | Source: Ambulatory Visit | Attending: Radiation Oncology | Admitting: Radiation Oncology

## 2019-11-04 DIAGNOSIS — C50212 Malignant neoplasm of upper-inner quadrant of left female breast: Secondary | ICD-10-CM | POA: Diagnosis not present

## 2019-11-04 DIAGNOSIS — Z170 Estrogen receptor positive status [ER+]: Secondary | ICD-10-CM | POA: Diagnosis not present

## 2019-11-04 DIAGNOSIS — Z510 Encounter for antineoplastic radiation therapy: Secondary | ICD-10-CM | POA: Diagnosis not present

## 2019-11-08 ENCOUNTER — Encounter: Payer: Self-pay | Admitting: Hematology and Oncology

## 2019-11-08 ENCOUNTER — Ambulatory Visit
Admission: RE | Admit: 2019-11-08 | Discharge: 2019-11-08 | Disposition: A | Discharge status: Home / Self Care | Payer: BC Managed Care – PPO | Source: Ambulatory Visit | Attending: Radiation Oncology | Admitting: Radiation Oncology

## 2019-11-08 ENCOUNTER — Other Ambulatory Visit: Payer: Self-pay

## 2019-11-08 DIAGNOSIS — Z170 Estrogen receptor positive status [ER+]: Secondary | ICD-10-CM | POA: Diagnosis not present

## 2019-11-08 DIAGNOSIS — C50212 Malignant neoplasm of upper-inner quadrant of left female breast: Secondary | ICD-10-CM | POA: Insufficient documentation

## 2019-11-08 DIAGNOSIS — Z510 Encounter for antineoplastic radiation therapy: Secondary | ICD-10-CM | POA: Diagnosis not present

## 2019-11-09 ENCOUNTER — Other Ambulatory Visit: Payer: Self-pay

## 2019-11-09 ENCOUNTER — Ambulatory Visit
Admission: RE | Admit: 2019-11-09 | Discharge: 2019-11-09 | Disposition: A | Discharge status: Home / Self Care | Payer: BC Managed Care – PPO | Source: Ambulatory Visit | Attending: Radiation Oncology | Admitting: Radiation Oncology

## 2019-11-09 DIAGNOSIS — Z170 Estrogen receptor positive status [ER+]: Secondary | ICD-10-CM | POA: Diagnosis not present

## 2019-11-09 DIAGNOSIS — C50212 Malignant neoplasm of upper-inner quadrant of left female breast: Secondary | ICD-10-CM | POA: Diagnosis not present

## 2019-11-09 DIAGNOSIS — Z510 Encounter for antineoplastic radiation therapy: Secondary | ICD-10-CM | POA: Diagnosis not present

## 2019-11-10 ENCOUNTER — Other Ambulatory Visit: Payer: Self-pay

## 2019-11-10 ENCOUNTER — Ambulatory Visit
Admission: RE | Admit: 2019-11-10 | Discharge: 2019-11-10 | Disposition: A | Discharge status: Home / Self Care | Payer: BC Managed Care – PPO | Source: Ambulatory Visit | Attending: Radiation Oncology | Admitting: Radiation Oncology

## 2019-11-10 DIAGNOSIS — Z170 Estrogen receptor positive status [ER+]: Secondary | ICD-10-CM | POA: Diagnosis not present

## 2019-11-10 DIAGNOSIS — C50212 Malignant neoplasm of upper-inner quadrant of left female breast: Secondary | ICD-10-CM | POA: Diagnosis not present

## 2019-11-10 DIAGNOSIS — Z510 Encounter for antineoplastic radiation therapy: Secondary | ICD-10-CM | POA: Diagnosis not present

## 2019-11-11 ENCOUNTER — Ambulatory Visit
Admission: RE | Admit: 2019-11-11 | Discharge: 2019-11-11 | Disposition: A | Discharge status: Home / Self Care | Payer: BC Managed Care – PPO | Source: Ambulatory Visit | Attending: Radiation Oncology | Admitting: Radiation Oncology

## 2019-11-11 ENCOUNTER — Other Ambulatory Visit: Payer: Self-pay

## 2019-11-11 DIAGNOSIS — Z510 Encounter for antineoplastic radiation therapy: Secondary | ICD-10-CM | POA: Diagnosis not present

## 2019-11-11 DIAGNOSIS — Z170 Estrogen receptor positive status [ER+]: Secondary | ICD-10-CM | POA: Diagnosis not present

## 2019-11-11 DIAGNOSIS — C50212 Malignant neoplasm of upper-inner quadrant of left female breast: Secondary | ICD-10-CM | POA: Diagnosis not present

## 2019-11-14 ENCOUNTER — Other Ambulatory Visit: Payer: Self-pay

## 2019-11-14 ENCOUNTER — Ambulatory Visit
Admission: RE | Admit: 2019-11-14 | Discharge: 2019-11-14 | Disposition: A | Discharge status: Home / Self Care | Payer: BC Managed Care – PPO | Source: Ambulatory Visit | Attending: Radiation Oncology | Admitting: Radiation Oncology

## 2019-11-14 DIAGNOSIS — Z510 Encounter for antineoplastic radiation therapy: Secondary | ICD-10-CM | POA: Diagnosis not present

## 2019-11-14 DIAGNOSIS — Z170 Estrogen receptor positive status [ER+]: Secondary | ICD-10-CM | POA: Diagnosis not present

## 2019-11-14 DIAGNOSIS — C50212 Malignant neoplasm of upper-inner quadrant of left female breast: Secondary | ICD-10-CM

## 2019-11-15 ENCOUNTER — Encounter: Payer: Self-pay | Admitting: Hematology and Oncology

## 2019-11-15 ENCOUNTER — Ambulatory Visit: Payer: BC Managed Care – PPO | Admitting: Radiation Oncology

## 2019-11-15 ENCOUNTER — Ambulatory Visit
Admission: RE | Admit: 2019-11-15 | Discharge: 2019-11-15 | Disposition: A | Discharge status: Home / Self Care | Payer: BC Managed Care – PPO | Source: Ambulatory Visit | Attending: Radiation Oncology | Admitting: Radiation Oncology

## 2019-11-15 ENCOUNTER — Other Ambulatory Visit: Payer: Self-pay

## 2019-11-15 DIAGNOSIS — Z170 Estrogen receptor positive status [ER+]: Secondary | ICD-10-CM | POA: Diagnosis not present

## 2019-11-15 DIAGNOSIS — C50212 Malignant neoplasm of upper-inner quadrant of left female breast: Secondary | ICD-10-CM

## 2019-11-15 DIAGNOSIS — Z510 Encounter for antineoplastic radiation therapy: Secondary | ICD-10-CM | POA: Diagnosis not present

## 2019-11-15 MED ORDER — SONAFINE EX EMUL
1.0000 "application " | Freq: Two times a day (BID) | CUTANEOUS | Status: DC
  Administered 2019-11-15: 1 via TOPICAL

## 2019-11-16 ENCOUNTER — Ambulatory Visit
Admission: RE | Admit: 2019-11-16 | Discharge: 2019-11-16 | Disposition: A | Discharge status: Home / Self Care | Payer: BC Managed Care – PPO | Source: Ambulatory Visit | Attending: Radiation Oncology | Admitting: Radiation Oncology

## 2019-11-16 ENCOUNTER — Ambulatory Visit: Payer: BC Managed Care – PPO

## 2019-11-16 ENCOUNTER — Other Ambulatory Visit: Payer: Self-pay

## 2019-11-16 DIAGNOSIS — Z510 Encounter for antineoplastic radiation therapy: Secondary | ICD-10-CM | POA: Diagnosis not present

## 2019-11-16 DIAGNOSIS — Z170 Estrogen receptor positive status [ER+]: Secondary | ICD-10-CM | POA: Diagnosis not present

## 2019-11-16 DIAGNOSIS — C50212 Malignant neoplasm of upper-inner quadrant of left female breast: Secondary | ICD-10-CM | POA: Diagnosis not present

## 2019-11-17 ENCOUNTER — Ambulatory Visit
Admission: RE | Admit: 2019-11-17 | Discharge: 2019-11-17 | Disposition: A | Discharge status: Home / Self Care | Payer: BC Managed Care – PPO | Source: Ambulatory Visit | Attending: Radiation Oncology | Admitting: Radiation Oncology

## 2019-11-17 ENCOUNTER — Other Ambulatory Visit: Payer: Self-pay

## 2019-11-17 ENCOUNTER — Ambulatory Visit: Payer: BC Managed Care – PPO

## 2019-11-17 DIAGNOSIS — C50212 Malignant neoplasm of upper-inner quadrant of left female breast: Secondary | ICD-10-CM | POA: Diagnosis not present

## 2019-11-17 DIAGNOSIS — Z170 Estrogen receptor positive status [ER+]: Secondary | ICD-10-CM | POA: Diagnosis not present

## 2019-11-17 DIAGNOSIS — Z510 Encounter for antineoplastic radiation therapy: Secondary | ICD-10-CM | POA: Diagnosis not present

## 2019-11-18 ENCOUNTER — Ambulatory Visit: Payer: BC Managed Care – PPO

## 2019-11-18 ENCOUNTER — Other Ambulatory Visit: Payer: Self-pay

## 2019-11-18 ENCOUNTER — Ambulatory Visit
Admission: RE | Admit: 2019-11-18 | Discharge: 2019-11-18 | Disposition: A | Discharge status: Home / Self Care | Payer: BC Managed Care – PPO | Source: Ambulatory Visit | Attending: Radiation Oncology | Admitting: Radiation Oncology

## 2019-11-18 DIAGNOSIS — Z170 Estrogen receptor positive status [ER+]: Secondary | ICD-10-CM | POA: Diagnosis not present

## 2019-11-18 DIAGNOSIS — C50212 Malignant neoplasm of upper-inner quadrant of left female breast: Secondary | ICD-10-CM | POA: Diagnosis not present

## 2019-11-18 DIAGNOSIS — Z510 Encounter for antineoplastic radiation therapy: Secondary | ICD-10-CM | POA: Diagnosis not present

## 2019-11-21 ENCOUNTER — Ambulatory Visit: Payer: BC Managed Care – PPO

## 2019-11-21 ENCOUNTER — Ambulatory Visit
Admission: RE | Admit: 2019-11-21 | Discharge: 2019-11-21 | Disposition: A | Discharge status: Home / Self Care | Payer: BC Managed Care – PPO | Source: Ambulatory Visit | Attending: Radiation Oncology | Admitting: Radiation Oncology

## 2019-11-21 ENCOUNTER — Other Ambulatory Visit: Payer: Self-pay

## 2019-11-21 DIAGNOSIS — Z510 Encounter for antineoplastic radiation therapy: Secondary | ICD-10-CM | POA: Diagnosis not present

## 2019-11-21 DIAGNOSIS — C50212 Malignant neoplasm of upper-inner quadrant of left female breast: Secondary | ICD-10-CM | POA: Diagnosis not present

## 2019-11-21 DIAGNOSIS — Z170 Estrogen receptor positive status [ER+]: Secondary | ICD-10-CM | POA: Diagnosis not present

## 2019-11-22 ENCOUNTER — Encounter: Payer: Self-pay | Admitting: Radiation Oncology

## 2019-11-22 ENCOUNTER — Inpatient Hospital Stay: Payer: BC Managed Care – PPO | Attending: Hematology and Oncology

## 2019-11-22 ENCOUNTER — Ambulatory Visit
Admission: RE | Admit: 2019-11-22 | Discharge: 2019-11-22 | Disposition: A | Discharge status: Home / Self Care | Payer: BC Managed Care – PPO | Source: Ambulatory Visit | Attending: Radiation Oncology | Admitting: Radiation Oncology

## 2019-11-22 ENCOUNTER — Encounter: Payer: Self-pay | Admitting: *Deleted

## 2019-11-22 ENCOUNTER — Ambulatory Visit: Payer: BC Managed Care – PPO

## 2019-11-22 ENCOUNTER — Other Ambulatory Visit: Payer: Self-pay

## 2019-11-22 VITALS — BP 153/100 | HR 95 | Temp 98.8°F | Resp 18

## 2019-11-22 DIAGNOSIS — C773 Secondary and unspecified malignant neoplasm of axilla and upper limb lymph nodes: Secondary | ICD-10-CM | POA: Insufficient documentation

## 2019-11-22 DIAGNOSIS — Z170 Estrogen receptor positive status [ER+]: Secondary | ICD-10-CM | POA: Insufficient documentation

## 2019-11-22 DIAGNOSIS — Z5112 Encounter for antineoplastic immunotherapy: Secondary | ICD-10-CM | POA: Diagnosis not present

## 2019-11-22 DIAGNOSIS — C50212 Malignant neoplasm of upper-inner quadrant of left female breast: Secondary | ICD-10-CM | POA: Insufficient documentation

## 2019-11-22 DIAGNOSIS — Z510 Encounter for antineoplastic radiation therapy: Secondary | ICD-10-CM | POA: Diagnosis not present

## 2019-11-22 MED ORDER — TRASTUZUMAB-DKST CHEMO 150 MG IV SOLR
6.0000 mg/kg | Freq: Once | INTRAVENOUS | Status: AC
  Administered 2019-11-22: 546 mg via INTRAVENOUS
  Filled 2019-11-22: qty 26

## 2019-11-22 MED ORDER — ACETAMINOPHEN 325 MG PO TABS
650.0000 mg | ORAL_TABLET | Freq: Once | ORAL | Status: AC
  Administered 2019-11-22: 650 mg via ORAL

## 2019-11-22 MED ORDER — DIPHENHYDRAMINE HCL 25 MG PO CAPS
50.0000 mg | ORAL_CAPSULE | Freq: Once | ORAL | Status: AC
  Administered 2019-11-22: 50 mg via ORAL

## 2019-11-22 MED ORDER — ACETAMINOPHEN 325 MG PO TABS
ORAL_TABLET | ORAL | Status: AC
  Filled 2019-11-22: qty 2

## 2019-11-22 MED ORDER — SODIUM CHLORIDE 0.9 % IV SOLN
Freq: Once | INTRAVENOUS | Status: AC
  Filled 2019-11-22: qty 250

## 2019-11-22 MED ORDER — DIPHENHYDRAMINE HCL 25 MG PO CAPS
ORAL_CAPSULE | ORAL | Status: AC
  Filled 2019-11-22: qty 2

## 2019-11-23 ENCOUNTER — Ambulatory Visit: Payer: BC Managed Care – PPO

## 2019-12-15 NOTE — Progress Notes (Addendum)
Patient Care Team: Rochel Brome, MD as PCP - General (Family Medicine) Rockwell Germany, RN as Oncology Nurse Navigator Mauro Kaufmann, RN as Oncology Nurse Navigator  DIAGNOSIS:    ICD-10-CM   1. Malignant neoplasm of upper-inner quadrant of left breast in female, estrogen receptor positive ([place])  C50.212    Z17.0     SUMMARY OF ONCOLOGIC HISTORY: Oncology History  Malignant neoplasm of upper-inner quadrant of left breast in female, estrogen receptor positive ([place])  04/28/2019 Initial Diagnosis   Palpable left breast lump.  Mammogram 04/21/19 showed a 4.5cm mass at the 11:30 position with smaller adjacent solid masses and one abnormal left axillary lymph node. 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   05/10/2019 Cancer Staging   Staging form: Breast, AJCC 8th Edition - Clinical stage from 05/10/2019: Stage IIB (cT2, cN1, cM0, G3, ER+, PR+, HER2-) - Signed by Nicholas Lose, MD on 05/10/2019   05/19/2019 - 08/19/2019 Chemotherapy   The patient had palonosetron (ALOXI) injection 0.25 mg, 0.25 mg, Intravenous,  Once, 4 of 4 cycles Administration: 0.25 mg (05/19/2019), 0.25 mg (06/09/2019), 0.25 mg (07/08/2019), 0.25 mg (07/30/2019) pegfilgrastim-jmdb (FULPHILA) injection 6 mg, 6 mg, Subcutaneous,  Once, 4 of 4 cycles Administration: 6 mg (05/20/2019), 6 mg (06/11/2019), 6 mg (07/11/2019), 6 mg (08/01/2019) cyclophosphamide (CYTOXAN) 1,280 mg in sodium chloride 0.9 % 250 mL chemo infusion, 600 mg/m2 = 1,280 mg, Intravenous,  Once, 4 of 4 cycles Dose modification: 400 mg/m2 (original dose 600 mg/m2, Cycle 3, Reason: Dose not tolerated) Administration: 1,280 mg (05/19/2019), 1,280 mg (06/09/2019), 840 mg (07/08/2019), 840 mg (07/30/2019) DOCEtaxel (TAXOTERE) 160 mg in sodium chloride 0.9 % 250 mL chemo infusion, 75 mg/m2 = 160 mg, Intravenous,  Once, 4 of 4 cycles Dose modification: 50 mg/m2 (original  dose 75 mg/m2, Cycle 3, Reason: Dose not tolerated) Administration: 160 mg (05/19/2019), 160 mg (06/09/2019), 110 mg (07/08/2019), 110 mg (07/30/2019) fosaprepitant (EMEND) 150 mg in sodium chloride 0.9 % 145 mL IVPB, , Intravenous,  Once, 4 of 4 cycles Administration:  (05/19/2019),  (06/09/2019),  (07/08/2019),  (07/30/2019)  for chemotherapy treatment.    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    09/20/2019 -  Chemotherapy   The patient had trastuzumab-dkst (OGIVRI) 714 mg in sodium chloride 0.9 % 250 mL chemo infusion, 8 mg/kg = 714 mg, Intravenous,  Once, 4 of 17 cycles Administration: 714 mg (09/20/2019), 546 mg (10/11/2019), 546 mg (11/01/2019), 546 mg (11/22/2019)  for chemotherapy treatment.    10/07/2019 - 11/22/2019 Radiation Therapy   Adjuvant radiation     CHIEF COMPLIANT: Herceptin maintenance   INTERVAL HISTORY: Lyrika Souders is a 54 y.o. with above-mentioned history of left breast cancertreated withneoadjuvant chemotherapy, left lumpectomy, radiation therapy, and is currently on adjuvant Herceptin. She presentsto the clinic today for treatment.  She has tolerated radiation very well and is healing and recovering from that. She is tolerating Herceptin extremely well.  ALLERGIES:  has No Known Allergies.  MEDICATIONS:  Current Outpatient Medications  Medication Sig Dispense Refill   doxycycline (ADOXA) 50 MG tablet Take 50 mg by mouth 2 (two) times daily.     oxyCODONE (OXY IR/ROXICODONE) 5 MG immediate release tablet Take 1 tablet (5 mg total) by mouth every 6 (six) hours as needed for severe pain. (  Patient not taking: Reported on 09/15/2019) 15 tablet 0   penicillin v potassium (VEETID) 500 MG tablet      No current facility-administered medications for this visit.    PHYSICAL EXAMINATION: ECOG PERFORMANCE STATUS: 1 - Symptomatic but completely  ambulatory  Vitals:   12/16/19 1113  BP: (!) 159/88  Pulse: 73  Resp: 19  Temp: 98.7 F (37.1 C)  SpO2: 100%   Filed Weights   12/16/19 1113  Weight: 197 lb 1.6 oz (89.4 kg)    LABORATORY DATA:  I have reviewed the data as listed CMP Latest Ref Rng & Units 11/01/2019 10/11/2019 07/29/2019  Glucose 70 - 99 mg/dL 107(H) 96 101(H)  BUN 6 - 20 mg/dL '10 11 10  ' Creatinine 0.44 - 1.00 mg/dL 0.80 0.70 0.65  Sodium 135 - 145 mmol/L 137 138 136  Potassium 3.5 - 5.1 mmol/L 4.4 4.1 3.8  Chloride 98 - 111 mmol/L 104 102 104  CO2 22 - 32 mmol/L '25 25 23  ' Calcium 8.9 - 10.3 mg/dL 9.9 9.9 9.1  Total Protein 6.5 - 8.1 g/dL 7.9 7.9 7.4  Total Bilirubin 0.3 - 1.2 mg/dL 1.0 0.8 0.6  Alkaline Phos 38 - 126 U/L 114 131(H) 101  AST 15 - 41 U/L 196(HH) 350(HH) 122(H)  ALT 0 - 44 U/L 129(H) 206(H) 85(H)    Lab Results  Component Value Date   WBC 3.1 (L) 12/16/2019   HGB 10.9 (L) 12/16/2019   HCT 34.4 (L) 12/16/2019   MCV 89.4 12/16/2019   PLT 250 12/16/2019   NEUTROABS 2.1 12/16/2019    ASSESSMENT & PLAN:  Malignant neoplasm of upper-inner quadrant of left breast in female, estrogen receptor positive ([place]) 04/28/2019: Palpable left breast lump, mammogram 4.5 cm mass at 11:30 position and adjacent smaller masses, 1 abnormal left axillary lymph node: Biopsy of breast and axilla both positive for grade 3 IDC ER 95%, PR 90%, Ki-67 70%, HER-2 negative T2N1 stage IIb clinical stage  Recommendation: 1.Neoadjuvant chemotherapywith Taxotere and Cytoxan every 3 weeks x4completed 07/30/2019 2.Breast conserving surgery with targeted node dissection3/23/2021 3.Adjuvant radiation therapy 4.Followed by adjuvant antiestrogen therapy --------------------------------------------------------------------------------------------------------------------------------------------------------- Post neoadjuvant breast MRI 08/05/2019: Left breast cancer previously 3.8 cm is now 2.5 cm now ill-defined and less  confluent. Retroareolar mass 1.3 cm is now minimal enhancement. Left axilla lymph node abnormal  Left lumpectomy 08/30/2019: Left upper outer quadrant: Grade 3 IDC 1.5 cm with high-grade DCIS, margins negative Left lateral lumpectomy: Grade 3 IDC microscopic focus margins negative, 1/3 lymph nodes positive Repeat prognostic panel: ER 50% moderate, PR 10% strong, Ki-67 2%, HER-2 equivocal by IHC FISH positive for HER-2 ratio 3.5 and copy #5.55. (Heterogeneity in HER-2 expression)  Treatment plan: 1.Adjuvant Herceptin started 09/20/2019 2.Adjuvant radiation started 10/07/2019-12/15/2019 3.Followed by adjuvant antiestrogen therapy. Starting December 16, 2019 ----------------------------------------------------------------------------------------------------------------------------- Current treatment: Adjuvant Herceptin every 3 weeks  Herceptin toxicities: None  Anastrozole counseling:We discussed the risks and benefits of anti-estrogen therapy with aromatase inhibitors. These include but not limited to insomnia, hot flashes, mood changes, vaginal dryness, bone density loss, and weight gain. We strongly believe that the benefits far outweigh the risks. Patient understands these risks and consented to starting treatment. Planned treatment duration is 7 years.  Return to clinic every 3 weeks for Herceptin every 6 weeks to follow-up with me     No orders of the defined types were placed in this encounter.  The patient has a good understanding of the overall plan. she agrees with it. she will call with any  problems that may develop before the next visit here.  Total time spent: 30 mins including face to face time and time spent for planning, charting and coordination of care  Nicholas Lose, MD 12/16/2019  I, Cloyde Reams Dorshimer, am acting as scribe for Dr. Nicholas Lose.  I have reviewed the above documentation for accuracy and completeness, and I agree with the above.   Addendum: Elevated  LFTs and elevated bilirubin: Ultrasound of the abdomen revealed fatty liver. Because of the significant elevation of the bilirubin we will consult gastroenterology.  I will request an appointment for her to see Dr. Silverio Decamp.

## 2019-12-16 ENCOUNTER — Inpatient Hospital Stay: Payer: BC Managed Care – PPO | Admitting: Hematology and Oncology

## 2019-12-16 ENCOUNTER — Other Ambulatory Visit: Payer: Self-pay | Admitting: *Deleted

## 2019-12-16 ENCOUNTER — Encounter: Payer: Self-pay | Admitting: *Deleted

## 2019-12-16 ENCOUNTER — Encounter: Payer: Self-pay | Admitting: Gastroenterology

## 2019-12-16 ENCOUNTER — Inpatient Hospital Stay: Payer: BC Managed Care – PPO | Attending: Hematology and Oncology

## 2019-12-16 ENCOUNTER — Other Ambulatory Visit: Payer: Self-pay

## 2019-12-16 ENCOUNTER — Inpatient Hospital Stay: Payer: BC Managed Care – PPO

## 2019-12-16 DIAGNOSIS — Z95828 Presence of other vascular implants and grafts: Secondary | ICD-10-CM

## 2019-12-16 DIAGNOSIS — Z923 Personal history of irradiation: Secondary | ICD-10-CM | POA: Insufficient documentation

## 2019-12-16 DIAGNOSIS — R7989 Other specified abnormal findings of blood chemistry: Secondary | ICD-10-CM | POA: Diagnosis not present

## 2019-12-16 DIAGNOSIS — K760 Fatty (change of) liver, not elsewhere classified: Secondary | ICD-10-CM | POA: Diagnosis not present

## 2019-12-16 DIAGNOSIS — Z170 Estrogen receptor positive status [ER+]: Secondary | ICD-10-CM

## 2019-12-16 DIAGNOSIS — Z5112 Encounter for antineoplastic immunotherapy: Secondary | ICD-10-CM | POA: Diagnosis not present

## 2019-12-16 DIAGNOSIS — C50212 Malignant neoplasm of upper-inner quadrant of left female breast: Secondary | ICD-10-CM

## 2019-12-16 DIAGNOSIS — Z9221 Personal history of antineoplastic chemotherapy: Secondary | ICD-10-CM | POA: Diagnosis not present

## 2019-12-16 DIAGNOSIS — Z79899 Other long term (current) drug therapy: Secondary | ICD-10-CM | POA: Diagnosis not present

## 2019-12-16 DIAGNOSIS — K769 Liver disease, unspecified: Secondary | ICD-10-CM

## 2019-12-16 LAB — CBC WITH DIFFERENTIAL (CANCER CENTER ONLY)
Abs Immature Granulocytes: 0.01 10*3/uL (ref 0.00–0.07)
Basophils Absolute: 0 10*3/uL (ref 0.0–0.1)
Basophils Relative: 1 %
Eosinophils Absolute: 0.1 10*3/uL (ref 0.0–0.5)
Eosinophils Relative: 4 %
HCT: 34.4 % — ABNORMAL LOW (ref 36.0–46.0)
Hemoglobin: 10.9 g/dL — ABNORMAL LOW (ref 12.0–15.0)
Immature Granulocytes: 0 %
Lymphocytes Relative: 16 %
Lymphs Abs: 0.5 10*3/uL — ABNORMAL LOW (ref 0.7–4.0)
MCH: 28.3 pg (ref 26.0–34.0)
MCHC: 31.7 g/dL (ref 30.0–36.0)
MCV: 89.4 fL (ref 80.0–100.0)
Monocytes Absolute: 0.4 10*3/uL (ref 0.1–1.0)
Monocytes Relative: 12 %
Neutro Abs: 2.1 10*3/uL (ref 1.7–7.7)
Neutrophils Relative %: 67 %
Platelet Count: 250 10*3/uL (ref 150–400)
RBC: 3.85 MIL/uL — ABNORMAL LOW (ref 3.87–5.11)
RDW: 16.9 % — ABNORMAL HIGH (ref 11.5–15.5)
WBC Count: 3.1 10*3/uL — ABNORMAL LOW (ref 4.0–10.5)
nRBC: 0 % (ref 0.0–0.2)

## 2019-12-16 LAB — CMP (CANCER CENTER ONLY)
ALT: 200 U/L — ABNORMAL HIGH (ref 0–44)
AST: 185 U/L (ref 15–41)
Albumin: 3.6 g/dL (ref 3.5–5.0)
Alkaline Phosphatase: 104 U/L (ref 38–126)
Anion gap: 12 (ref 5–15)
BUN: 14 mg/dL (ref 6–20)
CO2: 24 mmol/L (ref 22–32)
Calcium: 9.6 mg/dL (ref 8.9–10.3)
Chloride: 101 mmol/L (ref 98–111)
Creatinine: 0.72 mg/dL (ref 0.44–1.00)
GFR, Est AFR Am: >60 mL/min (ref >60)
GFR, Estimated: >60 mL/min (ref >60)
Glucose, Bld: 112 mg/dL — ABNORMAL HIGH (ref 70–99)
Potassium: 3.7 mmol/L (ref 3.5–5.1)
Sodium: 137 mmol/L (ref 135–145)
Total Bilirubin: 1.7 mg/dL — ABNORMAL HIGH (ref 0.3–1.2)
Total Protein: 7.5 g/dL (ref 6.5–8.1)

## 2019-12-16 MED ORDER — ACETAMINOPHEN 325 MG PO TABS: 650.0000 mg | ORAL_TABLET | Freq: Once | ORAL | Status: DC

## 2019-12-16 MED ORDER — DIPHENHYDRAMINE HCL 25 MG PO CAPS
ORAL_CAPSULE | ORAL | Status: AC
  Filled 2019-12-16: qty 2

## 2019-12-16 MED ORDER — SODIUM CHLORIDE 0.9 % IV SOLN
Freq: Once | INTRAVENOUS | Status: AC
  Filled 2019-12-16: qty 250

## 2019-12-16 MED ORDER — TRASTUZUMAB-DKST CHEMO 150 MG IV SOLR
6.0000 mg/kg | Freq: Once | INTRAVENOUS | Status: AC
  Administered 2019-12-16: 546 mg via INTRAVENOUS
  Filled 2019-12-16: qty 26

## 2019-12-16 MED ORDER — SODIUM CHLORIDE 0.9% FLUSH
10.0000 mL | Freq: Once | INTRAVENOUS | Status: AC
  Administered 2019-12-16: 10 mL
  Filled 2019-12-16: qty 10

## 2019-12-16 MED ORDER — SODIUM CHLORIDE 0.9% FLUSH
10.0000 mL | INTRAVENOUS | Status: DC | PRN
  Administered 2019-12-16: 10 mL
  Filled 2019-12-16: qty 10

## 2019-12-16 MED ORDER — DIPHENHYDRAMINE HCL 25 MG PO CAPS
50.0000 mg | ORAL_CAPSULE | Freq: Once | ORAL | Status: AC
  Administered 2019-12-16: 50 mg via ORAL

## 2019-12-16 MED ORDER — ACETAMINOPHEN 325 MG PO TABS
ORAL_TABLET | ORAL | Status: AC
  Filled 2019-12-16: qty 2

## 2019-12-16 MED ORDER — HEPARIN SOD (PORK) LOCK FLUSH 100 UNIT/ML IV SOLN
500.0000 [IU] | Freq: Once | INTRAVENOUS | Status: AC | PRN
  Administered 2019-12-16: 500 [IU]
  Filled 2019-12-16: qty 5

## 2019-12-16 MED ORDER — ANASTROZOLE 1 MG PO TABS
1.0000 mg | ORAL_TABLET | Freq: Every day | ORAL | 3 refills | Status: DC
Start: 2019-12-16 — End: 2020-12-21

## 2019-12-16 NOTE — Assessment & Plan Note (Signed)
04/28/2019: Palpable left breast lump, mammogram 4.5 cm mass at 11:30 position and adjacent smaller masses, 1 abnormal left axillary lymph node: Biopsy of breast and axilla both positive for grade 3 IDC ER 95%, PR 90%, Ki-67 70%, HER-2 negative T2N1 stage IIb clinical stage  Recommendation: 1.Neoadjuvant chemotherapywith Taxotere and Cytoxan every 3 weeks x4completed 07/30/2019 2.Breast conserving surgery with targeted node dissection3/23/2021 3.Adjuvant radiation therapy 4.Followed by adjuvant antiestrogen therapy --------------------------------------------------------------------------------------------------------------------------------------------------------- Post neoadjuvant breast MRI 08/05/2019: Left breast cancer previously 3.8 cm is now 2.5 cm now ill-defined and less confluent. Retroareolar mass 1.3 cm is now minimal enhancement. Left axilla lymph node abnormal  Left lumpectomy 08/30/2019: Left upper outer quadrant: Grade 3 IDC 1.5 cm with high-grade DCIS, margins negative Left lateral lumpectomy: Grade 3 IDC microscopic focus margins negative, 1/3 lymph nodes positive Repeat prognostic panel: ER 50% moderate, PR 10% strong, Ki-67 2%, HER-2 equivocal by IHC FISH positive for HER-2 ratio 3.5 and copy #5.55. (Heterogeneity in HER-2 expression)  Treatment plan: 1.Adjuvant Herceptin started 09/20/2019 2.Adjuvant radiation started 10/07/2019-12/15/2019 3.Followed by adjuvant antiestrogen therapy.  To be started 01/08/2020 ----------------------------------------------------------------------------------------------------------------------------- Current treatment: Adjuvant Herceptin every [redacted] weeks along with adjuvant radiation Herceptin toxicities: None  Anastrozole counseling:We discussed the risks and benefits of anti-estrogen therapy with aromatase inhibitors. These include but not limited to insomnia, hot flashes, mood changes, vaginal dryness, bone density loss, and  weight gain. We strongly believe that the benefits far outweigh the risks. Patient understands these risks and consented to starting treatment. Planned treatment duration is 7 years.  Return to clinic every 3 weeks for Herceptin every 6 weeks to follow-up with me

## 2019-12-16 NOTE — Progress Notes (Signed)
CRITICAL VALUE ALERT  Critical Value:  AST 185, ALT 200, Total Bili 1.7  Date & Time Notied:  12/16/19 at 1150am  Provider Notified: Nicholas Lose, MD  Orders Received/Actions taken: MD notified.  Per MD okay to treat and pt needing referral to Dr. Silverio Decamp with Gastroenterology.

## 2019-12-16 NOTE — Patient Instructions (Signed)
[facility] °Discharge Instructions for Patients Receiving Chemotherapy ° °Today you received the following chemotherapy agents Trastuzumab ° °To help prevent nausea and vomiting after your treatment, we encourage you to take your nausea medication as directed. °  °If you develop nausea and vomiting that is not controlled by your nausea medication, call the clinic.  ° °BELOW ARE SYMPTOMS THAT SHOULD BE REPORTED IMMEDIATELY: °· *FEVER GREATER THAN 100.5 F °· *CHILLS WITH OR WITHOUT FEVER °· NAUSEA AND VOMITING THAT IS NOT CONTROLLED WITH YOUR NAUSEA MEDICATION °· *UNUSUAL SHORTNESS OF BREATH °· *UNUSUAL BRUISING OR BLEEDING °· TENDERNESS IN MOUTH AND THROAT WITH OR WITHOUT PRESENCE OF ULCERS °· *URINARY PROBLEMS °· *BOWEL PROBLEMS °· UNUSUAL RASH °Items with * indicate a potential emergency and should be followed up as soon as possible. ° °Feel free to call the clinic should you have any questions or concerns. The clinic phone number is [phone]. ° °Please show the CHEMO ALERT CARD at check-in to the Emergency Department and triage nurse. ° ° °

## 2019-12-16 NOTE — Progress Notes (Signed)
12/16/19   Hold acetaminophen as premedication today due to elevated LFT's.  T.O. Dr Delmer Islam DeSota RN/Phyllicia Dudek Ronnald Ramp, PharmD

## 2019-12-16 NOTE — Progress Notes (Signed)
Per MD request, referral placed for Dr. Silverio Decamp at [facility].

## 2019-12-19 NOTE — Progress Notes (Incomplete)
  Patient Name: Elizabeth Sharp MRN: [id number] DOB: 11-25-1965 Referring Physician: Nicholas Lose (Profile Not Attached) Date of Service: 11/22/2019 [facility], [place]                                                        End Of Treatment Note  Diagnoses: C50.212-Malignant neoplasm of upper-inner quadrant of left female breast  Cancer Staging: StageIIB(ympT1C, pNIa)LeftBreast UIQ,Invasive DuctalCarcinoma, ER+/ PR+/ Her2-, Grade3  Intent: Curative  Radiation Treatment Dates: 10/06/2019 through 11/22/2019 Site Technique Total Dose (Gy) Dose per Fx (Gy) Completed Fx Beam Energies  Breast, Left: Breast_Lt_Bst 3D 10/10 2 5/5 6X  Axilla, Left: Axilla_Lt 3D 50.4/50.4 1.8 28/28 6X, 10X  Breast, Left: Breast_Lt 3D 50.4/50.4 1.8 28/28 15X   Narrative: The patient tolerated radiation therapy relatively well. She did report redness and itching to the left breast. She denied breast pain and fatigue. During the second week of treatment, she was noted to have some mild radiation dermatitis and erythema to the left breast and supraclavicular region. No moist desquamation. On 11/01/2019, she was noted to have a brisk reaction throughout the left breast, supraclavicular region, and upper back. The more significant reaction along the supraclavicular region and upper back area was likely related to sun exposure while at the beach. There was also some dry desquamation but no moist desquamation. Towards the end of treatment, there was some minimal skin breakdown in the supraclavicular region and axillary region for which she was advised to use Neosporin Plus, which did improve the patient's symptoms. There was no moist desquamation during her final treatment.  Plan: The patient will follow-up with radiation oncology in one  month.  ________________________________________________   Blair Promise, PhD, MD  This document serves as a record of services personally performed by Gery Pray, MD. It was created on his behalf by Clerance Lav, a trained medical scribe. The creation of this record is based on the scribe's personal observations and the provider's statements to them. This document has been checked and approved by the attending provider.

## 2019-12-27 NOTE — Progress Notes (Signed)
  Radiation Oncology         [phone]) [phone] ________________________________  Name: Elizabeth Sharp MRN: [id number]  Date: 12/29/2019  DOB: March 16, 1966  Follow-Up Visit Note  CC: Rochel Brome, MD  Nicholas Lose, MD    ICD-10-CM   1. Malignant neoplasm of upper-inner quadrant of left breast in female, estrogen receptor positive ([place])  C50.212    Z17.0     Diagnosis: StageIIB(ympT1C, pNIa)LeftBreast UIQ,Invasive DuctalCarcinoma, ER+/ PR+/ Her2-, Grade3  Interval Since Last Radiation:  One month and one week.  Radiation Treatment Dates: 10/06/2019 through 11/22/2019 Site Technique Total Dose (Gy) Dose per Fx (Gy) Completed Fx Beam Energies  Breast, Left: Breast_Lt_Bst 3D 10/10 2 5/5 6X  Axilla, Left: Axilla_Lt 3D 50.4/50.4 1.8 28/28 6X, 10X  Breast, Left: Breast_Lt 3D 50.4/50.4 1.8 28/28 15X    Narrative:  Elizabeth Sharp returns today for routine follow-up. Since Elizabeth end of treatment, Elizabeth Sharp followed-up with Dr. Lindi Adie on 12/16/2019. Elizabeth Sharp continues on adjuvant Herceptin and is scheduled to begin adjuvant antiestrogen therapy with Anastrozole on 01/08/2020.  On review of systems, Elizabeth Sharp reports no complaints at this time.  Elizabeth Sharp denies residual fatigue.  Elizabeth Sharp denies any swelling of her left arm or hand..  ALLERGIES:  has No Known Allergies.  Meds: Current Outpatient Medications  Medication Sig Dispense Refill  . anastrozole (ARIMIDEX) 1 MG tablet Take 1 tablet (1 mg total) by mouth daily. (Sharp not taking: Reported on 12/29/2019) 90 tablet 3   No current facility-administered medications for this encounter.    Physical Findings: Elizabeth Sharp is in no acute distress. Sharp is alert and oriented.  height is _0  (1.753 m) and weight is 201 lb (91.2 kg). Her temperature is 98.1 F (36.7 C). Her blood pressure is 155/90 (abnormal) and her pulse is 104. Her respiration is 20 and oxygen saturation is 100%.  No significant changes. Lungs are clear to auscultation bilaterally.  Heart has regular rate and rhythm. No palpable cervical, supraclavicular, or axillary adenopathy. Abdomen soft, non-tender, normal bowel sounds. Right breast: No palpable mass, nipple discharge, or bleeding. Left breast: Skin is healed well.  Slight hyperpigmentation changes and mild edema in Elizabeth breast.  No dominant mass appreciated breast nipple discharge or bleeding.  Lab Findings: Lab Results  Component Value Date   WBC 3.1 (L) 12/16/2019   HGB 10.9 (L) 12/16/2019   HCT 34.4 (L) 12/16/2019   MCV 89.4 12/16/2019   PLT 250 12/16/2019    Radiographic Findings: No results found.  Impression: StageIIB(ympT1C, pNIa)LeftBreast UIQ,Invasive DuctalCarcinoma, ER+/ PR+/ Her2-, Grade3  Elizabeth Sharp has recovered well from her radiation therapy  Plan: Elizabeth Sharp is scheduled to follow-up with Dr. Lindi Adie on 01/27/2020. Elizabeth Sharp will follow-up with radiation oncology in as needed basis.    ____________________________________   Blair Promise, PhD, MD  This document serves as a record of services personally performed by Gery Pray, MD. It was created on his behalf by Clerance Lav, a trained medical scribe. Elizabeth creation of this record is based on Elizabeth scribe's personal observations and Elizabeth provider's statements to them. This document has been checked and approved by Elizabeth attending provider.

## 2019-12-29 ENCOUNTER — Ambulatory Visit
Admission: RE | Admit: 2019-12-29 | Discharge: 2019-12-29 | Disposition: A | Discharge status: Home / Self Care | Payer: BC Managed Care – PPO | Source: Ambulatory Visit | Attending: Radiation Oncology | Admitting: Radiation Oncology

## 2019-12-29 ENCOUNTER — Encounter: Payer: Self-pay | Admitting: Hematology and Oncology

## 2019-12-29 ENCOUNTER — Other Ambulatory Visit: Payer: Self-pay

## 2019-12-29 ENCOUNTER — Encounter: Payer: Self-pay | Admitting: Radiation Oncology

## 2019-12-29 DIAGNOSIS — Z923 Personal history of irradiation: Secondary | ICD-10-CM | POA: Diagnosis not present

## 2019-12-29 DIAGNOSIS — C773 Secondary and unspecified malignant neoplasm of axilla and upper limb lymph nodes: Secondary | ICD-10-CM | POA: Insufficient documentation

## 2019-12-29 DIAGNOSIS — Z170 Estrogen receptor positive status [ER+]: Secondary | ICD-10-CM | POA: Insufficient documentation

## 2019-12-29 DIAGNOSIS — C50212 Malignant neoplasm of upper-inner quadrant of left female breast: Secondary | ICD-10-CM | POA: Diagnosis not present

## 2019-12-29 DIAGNOSIS — Z79811 Long term (current) use of aromatase inhibitors: Secondary | ICD-10-CM | POA: Insufficient documentation

## 2019-12-29 NOTE — Progress Notes (Signed)
Patient here for a 1 month f/u visit with Dr. Sondra Come. She denis any problems. She was to start Anastrazole and is waiting for the rx to be called in by Dr. Lindi Adie. Patient will f/u with his nurse.  BP (!) 155/90 (BP Location: Right Arm, Patient Position: Sitting, Cuff Size: Normal)   Pulse 104   Temp 98.1 F (36.7 C)   Resp 20   Ht 5\' 9"  (1.753 m)   Wt 201 lb (91.2 kg)   SpO2 100%   BMI 29.68 kg/m    Wt Readings from Last 3 Encounters:  12/29/19 201 lb (91.2 kg)  12/16/19 197 lb 1.6 oz (89.4 kg)  11/01/19 193 lb 9.6 oz (87.8 kg)

## 2020-01-03 ENCOUNTER — Encounter: Payer: Self-pay | Admitting: Hematology and Oncology

## 2020-01-05 ENCOUNTER — Other Ambulatory Visit: Payer: Self-pay

## 2020-01-05 ENCOUNTER — Inpatient Hospital Stay: Payer: BC Managed Care – PPO

## 2020-01-05 VITALS — BP 158/81 | HR 90 | Temp 98.3°F | Resp 18 | Wt 198.8 lb

## 2020-01-05 DIAGNOSIS — Z170 Estrogen receptor positive status [ER+]: Secondary | ICD-10-CM | POA: Diagnosis not present

## 2020-01-05 DIAGNOSIS — Z79899 Other long term (current) drug therapy: Secondary | ICD-10-CM | POA: Diagnosis not present

## 2020-01-05 DIAGNOSIS — K760 Fatty (change of) liver, not elsewhere classified: Secondary | ICD-10-CM | POA: Diagnosis not present

## 2020-01-05 DIAGNOSIS — C50212 Malignant neoplasm of upper-inner quadrant of left female breast: Secondary | ICD-10-CM

## 2020-01-05 DIAGNOSIS — Z923 Personal history of irradiation: Secondary | ICD-10-CM | POA: Diagnosis not present

## 2020-01-05 DIAGNOSIS — R7989 Other specified abnormal findings of blood chemistry: Secondary | ICD-10-CM | POA: Diagnosis not present

## 2020-01-05 DIAGNOSIS — Z5112 Encounter for antineoplastic immunotherapy: Secondary | ICD-10-CM | POA: Diagnosis not present

## 2020-01-05 DIAGNOSIS — Z9221 Personal history of antineoplastic chemotherapy: Secondary | ICD-10-CM | POA: Diagnosis not present

## 2020-01-05 MED ORDER — DIPHENHYDRAMINE HCL 25 MG PO CAPS
50.0000 mg | ORAL_CAPSULE | Freq: Once | ORAL | Status: AC
  Administered 2020-01-05: 50 mg via ORAL

## 2020-01-05 MED ORDER — ACETAMINOPHEN 325 MG PO TABS: 650.0000 mg | ORAL_TABLET | Freq: Once | ORAL | Status: DC

## 2020-01-05 MED ORDER — DIPHENHYDRAMINE HCL 25 MG PO CAPS
ORAL_CAPSULE | ORAL | Status: AC
  Filled 2020-01-05: qty 2

## 2020-01-05 MED ORDER — SODIUM CHLORIDE 0.9% FLUSH
10.0000 mL | INTRAVENOUS | Status: DC | PRN
  Administered 2020-01-05: 10 mL
  Filled 2020-01-05: qty 10

## 2020-01-05 MED ORDER — ACETAMINOPHEN 325 MG PO TABS
ORAL_TABLET | ORAL | Status: AC
  Filled 2020-01-05: qty 2

## 2020-01-05 MED ORDER — SODIUM CHLORIDE 0.9 % IV SOLN
Freq: Once | INTRAVENOUS | Status: AC
  Filled 2020-01-05: qty 250

## 2020-01-05 MED ORDER — HEPARIN SOD (PORK) LOCK FLUSH 100 UNIT/ML IV SOLN
500.0000 [IU] | Freq: Once | INTRAVENOUS | Status: AC | PRN
  Administered 2020-01-05: 500 [IU]
  Filled 2020-01-05: qty 5

## 2020-01-05 MED ORDER — TRASTUZUMAB-DKST CHEMO 150 MG IV SOLR
6.0000 mg/kg | Freq: Once | INTRAVENOUS | Status: AC
  Administered 2020-01-05: 546 mg via INTRAVENOUS
  Filled 2020-01-05: qty 26

## 2020-01-05 NOTE — Progress Notes (Signed)
01/06/20  Hold Acetaminophen for elevated LFT's today.  Henreitta Leber, PharmD

## 2020-01-05 NOTE — Patient Instructions (Signed)
[facility] °Discharge Instructions for Patients Receiving Chemotherapy ° °Today you received the following chemotherapy agents Trastuzumab ° °To help prevent nausea and vomiting after your treatment, we encourage you to take your nausea medication as directed. °  °If you develop nausea and vomiting that is not controlled by your nausea medication, call the clinic.  ° °BELOW ARE SYMPTOMS THAT SHOULD BE REPORTED IMMEDIATELY: °· *FEVER GREATER THAN 100.5 F °· *CHILLS WITH OR WITHOUT FEVER °· NAUSEA AND VOMITING THAT IS NOT CONTROLLED WITH YOUR NAUSEA MEDICATION °· *UNUSUAL SHORTNESS OF BREATH °· *UNUSUAL BRUISING OR BLEEDING °· TENDERNESS IN MOUTH AND THROAT WITH OR WITHOUT PRESENCE OF ULCERS °· *URINARY PROBLEMS °· *BOWEL PROBLEMS °· UNUSUAL RASH °Items with * indicate a potential emergency and should be followed up as soon as possible. ° °Feel free to call the clinic should you have any questions or concerns. The clinic phone number is [phone]. ° °Please show the CHEMO ALERT CARD at check-in to the Emergency Department and triage nurse. ° ° °

## 2020-01-06 ENCOUNTER — Other Ambulatory Visit: Payer: BC Managed Care – PPO

## 2020-01-06 ENCOUNTER — Other Ambulatory Visit: Payer: Self-pay

## 2020-01-06 ENCOUNTER — Ambulatory Visit: Payer: BC Managed Care – PPO

## 2020-01-06 ENCOUNTER — Ambulatory Visit: Payer: BC Managed Care – PPO | Admitting: Hematology and Oncology

## 2020-01-06 DIAGNOSIS — Z170 Estrogen receptor positive status [ER+]: Secondary | ICD-10-CM

## 2020-01-06 MED ORDER — LIDOCAINE-PRILOCAINE 2.5-2.5 % EX CREA: TOPICAL_CREAM | CUTANEOUS | Status: DC | PRN

## 2020-01-06 NOTE — Progress Notes (Signed)
Called pt to inform her that Emla cream Rx has been sent to pharmacy per Dr Lindi Adie and pt request. LVM Advising pt to call office if she has any concerns.

## 2020-01-26 NOTE — Progress Notes (Signed)
Patient Care Team: Rochel Brome, MD as PCP - General (Family Medicine) Rockwell Germany, RN as Oncology Nurse Navigator Mauro Kaufmann, RN as Oncology Nurse Navigator  DIAGNOSIS:    ICD-10-CM   1. Malignant neoplasm of upper-inner quadrant of left breast in female, estrogen receptor positive ([place])  C50.212    Z17.0     SUMMARY OF ONCOLOGIC HISTORY: Oncology History  Malignant neoplasm of upper-inner quadrant of left breast in female, estrogen receptor positive ([place])  04/28/2019 Initial Diagnosis   Palpable left breast lump.  Mammogram 04/21/19 showed a 4.5cm mass at the 11:30 position with smaller adjacent solid masses and one abnormal left axillary lymph node. 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   05/10/2019 Cancer Staging   Staging form: Breast, AJCC 8th Edition - Clinical stage from 05/10/2019: Stage IIB (cT2, cN1, cM0, G3, ER+, PR+, HER2-) - Signed by Nicholas Lose, MD on 05/10/2019   05/19/2019 - 08/19/2019 Chemotherapy   The patient had palonosetron (ALOXI) injection 0.25 mg, 0.25 mg, Intravenous,  Once, 4 of 4 cycles Administration: 0.25 mg (05/19/2019), 0.25 mg (06/09/2019), 0.25 mg (07/08/2019), 0.25 mg (07/30/2019) pegfilgrastim-jmdb (FULPHILA) injection 6 mg, 6 mg, Subcutaneous,  Once, 4 of 4 cycles Administration: 6 mg (05/20/2019), 6 mg (06/11/2019), 6 mg (07/11/2019), 6 mg (08/01/2019) cyclophosphamide (CYTOXAN) 1,280 mg in sodium chloride 0.9 % 250 mL chemo infusion, 600 mg/m2 = 1,280 mg, Intravenous,  Once, 4 of 4 cycles Dose modification: 400 mg/m2 (original dose 600 mg/m2, Cycle 3, Reason: Dose not tolerated) Administration: 1,280 mg (05/19/2019), 1,280 mg (06/09/2019), 840 mg (07/08/2019), 840 mg (07/30/2019) DOCEtaxel (TAXOTERE) 160 mg in sodium chloride 0.9 % 250 mL chemo infusion, 75 mg/m2 = 160 mg, Intravenous,  Once, 4 of 4 cycles Dose modification: 50 mg/m2 (original  dose 75 mg/m2, Cycle 3, Reason: Dose not tolerated) Administration: 160 mg (05/19/2019), 160 mg (06/09/2019), 110 mg (07/08/2019), 110 mg (07/30/2019) fosaprepitant (EMEND) 150 mg in sodium chloride 0.9 % 145 mL IVPB, , Intravenous,  Once, 4 of 4 cycles Administration:  (05/19/2019),  (06/09/2019),  (07/08/2019),  (07/30/2019)  for chemotherapy treatment.    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    09/20/2019 -  Chemotherapy   The patient had trastuzumab-dkst (OGIVRI) 714 mg in sodium chloride 0.9 % 250 mL chemo infusion, 8 mg/kg = 714 mg, Intravenous,  Once, 6 of 17 cycles Administration: 714 mg (09/20/2019), 546 mg (10/11/2019), 546 mg (12/16/2019), 546 mg (01/05/2020), 546 mg (11/01/2019), 546 mg (11/22/2019)  for chemotherapy treatment.    10/07/2019 - 11/22/2019 Radiation Therapy   Adjuvant radiation   12/16/2019 -  Anti-estrogen oral therapy   Anastrozole, planned duration 7 years     CHIEF COMPLIANT: Herceptin maintenance  INTERVAL HISTORY: Elizabeth Sharp is a 54 y.o. with above-mentioned history of left breast cancertreated withneoadjuvant chemotherapy,left lumpectomy, radiation therapy, and is currently on adjuvant Herceptin and antiestrogen therapy with anastrozole.She presentsto the clinic todayfor treatment.  She is tolerating her treatment extremely well without any problems.  She is also tolerating anastrozole without any problems but denies any hot flashes or myalgias.  ALLERGIES:  has No Known Allergies.  MEDICATIONS:  Current Outpatient Medications  Medication Sig Dispense Refill  . anastrozole (ARIMIDEX) 1 MG tablet Take 1 tablet (1 mg total) by mouth daily. (  Patient not taking: Reported on 12/29/2019) 90 tablet 3   No current facility-administered medications for this visit.    PHYSICAL EXAMINATION: ECOG PERFORMANCE STATUS: 1 - Symptomatic but  completely ambulatory  There were no vitals filed for this visit. There were no vitals filed for this visit.  LABORATORY DATA:  I have reviewed the data as listed CMP Latest Ref Rng & Units 12/16/2019 11/01/2019 10/11/2019  Glucose 70 - 99 mg/dL 112(H) 107(H) 96  BUN 6 - 20 mg/dL '14 10 11  ' Creatinine 0.44 - 1.00 mg/dL 0.72 0.80 0.70  Sodium 135 - 145 mmol/L 137 137 138  Potassium 3.5 - 5.1 mmol/L 3.7 4.4 4.1  Chloride 98 - 111 mmol/L 101 104 102  CO2 22 - 32 mmol/L '24 25 25  ' Calcium 8.9 - 10.3 mg/dL 9.6 9.9 9.9  Total Protein 6.5 - 8.1 g/dL 7.5 7.9 7.9  Total Bilirubin 0.3 - 1.2 mg/dL 1.7(H) 1.0 0.8  Alkaline Phos 38 - 126 U/L 104 114 131(H)  AST 15 - 41 U/L 185(HH) 196(HH) 350(HH)  ALT 0 - 44 U/L 200(H) 129(H) 206(H)    Lab Results  Component Value Date   WBC 3.1 (L) 12/16/2019   HGB 10.9 (L) 12/16/2019   HCT 34.4 (L) 12/16/2019   MCV 89.4 12/16/2019   PLT 250 12/16/2019   NEUTROABS 2.1 12/16/2019    ASSESSMENT & PLAN:  Malignant neoplasm of upper-inner quadrant of left breast in female, estrogen receptor positive ([place]) 04/28/2019: Palpable left breast lump, mammogram 4.5 cm mass at 11:30 position and adjacent smaller masses, 1 abnormal left axillary lymph node: Biopsy of breast and axilla both positive for grade 3 IDC ER 95%, PR 90%, Ki-67 70%, HER-2 negative T2N1 stage IIb clinical stage  Recommendation: 1.Neoadjuvant chemotherapywith Taxotere and Cytoxan every 3 weeks x4completed 07/30/2019, Adjuvant Herceptinstarted 09/20/2019 2.Breast conserving surgery with targeted node dissection3/23/2021 3.Adjuvant radiation therapy 10/07/2019-12/15/2019 4.Followed by adjuvant antiestrogen therapy  Starting December 16, 2019 --------------------------------------------------------------------------------------------------------------------------------------------------------- Post neoadjuvant breast MRI 08/05/2019: Left breast cancer previously 3.8 cm is now 2.5 cm now ill-defined  and less confluent. Retroareolar mass 1.3 cm is now minimal enhancement. Left axilla lymph node abnormal  Left lumpectomy 08/30/2019: Left upper outer quadrant: Grade 3 IDC 1.5 cm with high-grade DCIS, margins negative Left lateral lumpectomy: Grade 3 IDC microscopic focus margins negative, 1/3 lymph nodes positive Repeat prognostic panel: ER 50% moderate, PR 10% strong, Ki-67 2%, HER-2 equivocal by IHC FISH positive for HER-2 ratio 3.5 and copy #5.55. (Heterogeneity in HER-2 expression)  Current treatment: Adjuvant Herceptin every [redacted] weeks along with anastrozole Herceptin toxicities: None  Anastrozole toxicities: Denies any hot flashes or myalgias.  Return to clinic every 3 weeks for Herceptin every 6 weeks to follow-up with me.    No orders of the defined types were placed in this encounter.  The patient has a good understanding of the overall plan. she agrees with it. she will call with any problems that may develop before the next visit here.  Total time spent: 30 mins including face to face time and time spent for planning, charting and coordination of care  Nicholas Lose, MD 01/27/2020  I, Cloyde Reams Dorshimer, am acting as scribe for Dr. Nicholas Lose.  I have reviewed the above documentation for accuracy and completeness, and I agree with the above.

## 2020-01-27 ENCOUNTER — Encounter: Payer: Self-pay | Admitting: *Deleted

## 2020-01-27 ENCOUNTER — Other Ambulatory Visit: Payer: Self-pay

## 2020-01-27 ENCOUNTER — Other Ambulatory Visit: Payer: BC Managed Care – PPO

## 2020-01-27 ENCOUNTER — Ambulatory Visit: Payer: BC Managed Care – PPO

## 2020-01-27 ENCOUNTER — Other Ambulatory Visit: Payer: Self-pay | Admitting: *Deleted

## 2020-01-27 ENCOUNTER — Inpatient Hospital Stay: Payer: BC Managed Care – PPO

## 2020-01-27 ENCOUNTER — Inpatient Hospital Stay (HOSPITAL_BASED_OUTPATIENT_CLINIC_OR_DEPARTMENT_OTHER): Payer: BC Managed Care – PPO | Admitting: Hematology and Oncology

## 2020-01-27 ENCOUNTER — Ambulatory Visit: Payer: BC Managed Care – PPO | Admitting: Hematology and Oncology

## 2020-01-27 ENCOUNTER — Inpatient Hospital Stay: Payer: BC Managed Care – PPO | Attending: Hematology and Oncology

## 2020-01-27 DIAGNOSIS — Z95828 Presence of other vascular implants and grafts: Secondary | ICD-10-CM

## 2020-01-27 DIAGNOSIS — Z923 Personal history of irradiation: Secondary | ICD-10-CM | POA: Diagnosis not present

## 2020-01-27 DIAGNOSIS — C773 Secondary and unspecified malignant neoplasm of axilla and upper limb lymph nodes: Secondary | ICD-10-CM | POA: Diagnosis not present

## 2020-01-27 DIAGNOSIS — C50212 Malignant neoplasm of upper-inner quadrant of left female breast: Secondary | ICD-10-CM | POA: Insufficient documentation

## 2020-01-27 DIAGNOSIS — Z5112 Encounter for antineoplastic immunotherapy: Secondary | ICD-10-CM | POA: Diagnosis not present

## 2020-01-27 DIAGNOSIS — Z9221 Personal history of antineoplastic chemotherapy: Secondary | ICD-10-CM | POA: Insufficient documentation

## 2020-01-27 DIAGNOSIS — Z79899 Other long term (current) drug therapy: Secondary | ICD-10-CM | POA: Insufficient documentation

## 2020-01-27 DIAGNOSIS — Z170 Estrogen receptor positive status [ER+]: Secondary | ICD-10-CM

## 2020-01-27 DIAGNOSIS — Z5181 Encounter for therapeutic drug level monitoring: Secondary | ICD-10-CM

## 2020-01-27 LAB — CMP (CANCER CENTER ONLY)
ALT: 161 U/L — ABNORMAL HIGH (ref 0–44)
AST: 146 U/L — ABNORMAL HIGH (ref 15–41)
Albumin: 3.5 g/dL (ref 3.5–5.0)
Alkaline Phosphatase: 101 U/L (ref 38–126)
Anion gap: 12 (ref 5–15)
BUN: 11 mg/dL (ref 6–20)
CO2: 25 mmol/L (ref 22–32)
Calcium: 9.7 mg/dL (ref 8.9–10.3)
Chloride: 103 mmol/L (ref 98–111)
Creatinine: 0.67 mg/dL (ref 0.44–1.00)
GFR, Est AFR Am: >60 mL/min (ref >60)
GFR, Estimated: >60 mL/min (ref >60)
Glucose, Bld: 109 mg/dL — ABNORMAL HIGH (ref 70–99)
Potassium: 4.2 mmol/L (ref 3.5–5.1)
Sodium: 140 mmol/L (ref 135–145)
Total Bilirubin: 0.6 mg/dL (ref 0.3–1.2)
Total Protein: 7.4 g/dL (ref 6.5–8.1)

## 2020-01-27 LAB — CBC WITH DIFFERENTIAL (CANCER CENTER ONLY)
Abs Immature Granulocytes: 0.01 10*3/uL (ref 0.00–0.07)
Basophils Absolute: 0.1 10*3/uL (ref 0.0–0.1)
Basophils Relative: 2 %
Eosinophils Absolute: 0.2 10*3/uL (ref 0.0–0.5)
Eosinophils Relative: 5 %
HCT: 34.9 % — ABNORMAL LOW (ref 36.0–46.0)
Hemoglobin: 11.1 g/dL — ABNORMAL LOW (ref 12.0–15.0)
Immature Granulocytes: 0 %
Lymphocytes Relative: 27 %
Lymphs Abs: 0.9 10*3/uL (ref 0.7–4.0)
MCH: 28.6 pg (ref 26.0–34.0)
MCHC: 31.8 g/dL (ref 30.0–36.0)
MCV: 89.9 fL (ref 80.0–100.0)
Monocytes Absolute: 0.4 10*3/uL (ref 0.1–1.0)
Monocytes Relative: 12 %
Neutro Abs: 1.8 10*3/uL (ref 1.7–7.7)
Neutrophils Relative %: 54 %
Platelet Count: 245 10*3/uL (ref 150–400)
RBC: 3.88 MIL/uL (ref 3.87–5.11)
RDW: 14.8 % (ref 11.5–15.5)
WBC Count: 3.3 10*3/uL — ABNORMAL LOW (ref 4.0–10.5)
nRBC: 0 % (ref 0.0–0.2)

## 2020-01-27 MED ORDER — ACETAMINOPHEN 325 MG PO TABS: 650.0000 mg | ORAL_TABLET | Freq: Once | ORAL | Status: DC

## 2020-01-27 MED ORDER — SODIUM CHLORIDE 0.9% FLUSH
10.0000 mL | Freq: Once | INTRAVENOUS | Status: AC
  Administered 2020-01-27: 10 mL
  Filled 2020-01-27: qty 10

## 2020-01-27 MED ORDER — HEPARIN SOD (PORK) LOCK FLUSH 100 UNIT/ML IV SOLN
500.0000 [IU] | Freq: Once | INTRAVENOUS | Status: AC | PRN
  Administered 2020-01-27: 500 [IU]
  Filled 2020-01-27: qty 5

## 2020-01-27 MED ORDER — SODIUM CHLORIDE 0.9% FLUSH
10.0000 mL | INTRAVENOUS | Status: DC | PRN
  Administered 2020-01-27: 10 mL
  Filled 2020-01-27: qty 10

## 2020-01-27 MED ORDER — SODIUM CHLORIDE 0.9 % IV SOLN
Freq: Once | INTRAVENOUS | Status: AC
  Filled 2020-01-27: qty 250

## 2020-01-27 MED ORDER — DIPHENHYDRAMINE HCL 25 MG PO CAPS
50.0000 mg | ORAL_CAPSULE | Freq: Once | ORAL | Status: AC
  Administered 2020-01-27: 50 mg via ORAL

## 2020-01-27 MED ORDER — TRASTUZUMAB-DKST CHEMO 150 MG IV SOLR
6.0000 mg/kg | Freq: Once | INTRAVENOUS | Status: AC
  Administered 2020-01-27: 546 mg via INTRAVENOUS
  Filled 2020-01-27: qty 26

## 2020-01-27 MED ORDER — DIPHENHYDRAMINE HCL 25 MG PO CAPS
ORAL_CAPSULE | ORAL | Status: AC
  Filled 2020-01-27: qty 2

## 2020-01-27 NOTE — Progress Notes (Signed)
Hold acetaminophen today due to elevated LFTs RN to get patient scheduled for ECHO - ok to proceed today.  T.O. Dr Gaye Pollack, RN/Terryl Molinelli Ronnald Ramp, PharmD

## 2020-01-27 NOTE — Patient Instructions (Signed)
[facility] °Discharge Instructions for Patients Receiving Chemotherapy ° °Today you received the following chemotherapy agents Trastuzumab ° °To help prevent nausea and vomiting after your treatment, we encourage you to take your nausea medication as directed. °  °If you develop nausea and vomiting that is not controlled by your nausea medication, call the clinic.  ° °BELOW ARE SYMPTOMS THAT SHOULD BE REPORTED IMMEDIATELY: °· *FEVER GREATER THAN 100.5 F °· *CHILLS WITH OR WITHOUT FEVER °· NAUSEA AND VOMITING THAT IS NOT CONTROLLED WITH YOUR NAUSEA MEDICATION °· *UNUSUAL SHORTNESS OF BREATH °· *UNUSUAL BRUISING OR BLEEDING °· TENDERNESS IN MOUTH AND THROAT WITH OR WITHOUT PRESENCE OF ULCERS °· *URINARY PROBLEMS °· *BOWEL PROBLEMS °· UNUSUAL RASH °Items with * indicate a potential emergency and should be followed up as soon as possible. ° °Feel free to call the clinic should you have any questions or concerns. The clinic phone number is [phone]. ° °Please show the CHEMO ALERT CARD at check-in to the Emergency Department and triage nurse. ° ° °

## 2020-01-27 NOTE — Assessment & Plan Note (Signed)
04/28/2019: Palpable left breast lump, mammogram 4.5 cm mass at 11:30 position and adjacent smaller masses, 1 abnormal left axillary lymph node: Biopsy of breast and axilla both positive for grade 3 IDC ER 95%, PR 90%, Ki-67 70%, HER-2 negative T2N1 stage IIb clinical stage  Recommendation: 1.Neoadjuvant chemotherapywith Taxotere and Cytoxan every 3 weeks x4completed 07/30/2019, Adjuvant Herceptinstarted 09/20/2019 2.Breast conserving surgery with targeted node dissection3/23/2021 3.Adjuvant radiation therapy 10/07/2019-12/15/2019 4.Followed by adjuvant antiestrogen therapy  Starting December 16, 2019 --------------------------------------------------------------------------------------------------------------------------------------------------------- Post neoadjuvant breast MRI 08/05/2019: Left breast cancer previously 3.8 cm is now 2.5 cm now ill-defined and less confluent. Retroareolar mass 1.3 cm is now minimal enhancement. Left axilla lymph node abnormal  Left lumpectomy 08/30/2019: Left upper outer quadrant: Grade 3 IDC 1.5 cm with high-grade DCIS, margins negative Left lateral lumpectomy: Grade 3 IDC microscopic focus margins negative, 1/3 lymph nodes positive Repeat prognostic panel: ER 50% moderate, PR 10% strong, Ki-67 2%, HER-2 equivocal by IHC FISH positive for HER-2 ratio 3.5 and copy #5.55. (Heterogeneity in HER-2 expression)  Current treatment: Adjuvant Herceptin every [redacted] weeks along with anastrozole Herceptin toxicities: None  Anastrozole toxicities:  Return to clinic every 3 weeks for Herceptin every 6 weeks to follow-up with me.

## 2020-01-27 NOTE — Progress Notes (Signed)
Per MD request, order placed for pt to receive echocardiogram within the next week.

## 2020-01-27 NOTE — Patient Instructions (Signed)

## 2020-01-30 ENCOUNTER — Telehealth: Payer: Self-pay | Admitting: Hematology and Oncology

## 2020-01-30 NOTE — Telephone Encounter (Signed)
No 8/20 los, no changes made to the pt schedule

## 2020-02-01 ENCOUNTER — Ambulatory Visit: Payer: BC Managed Care – PPO | Admitting: Gastroenterology

## 2020-02-06 ENCOUNTER — Ambulatory Visit (HOSPITAL_COMMUNITY)
Admission: RE | Admit: 2020-02-06 | Discharge: 2020-02-06 | Disposition: A | Discharge status: Home / Self Care | Payer: BC Managed Care – PPO | Source: Ambulatory Visit | Attending: Hematology and Oncology | Admitting: Hematology and Oncology

## 2020-02-06 ENCOUNTER — Other Ambulatory Visit: Payer: Self-pay

## 2020-02-06 DIAGNOSIS — Z79899 Other long term (current) drug therapy: Secondary | ICD-10-CM | POA: Diagnosis not present

## 2020-02-06 DIAGNOSIS — C50919 Malignant neoplasm of unspecified site of unspecified female breast: Secondary | ICD-10-CM | POA: Insufficient documentation

## 2020-02-06 DIAGNOSIS — Z5181 Encounter for therapeutic drug level monitoring: Secondary | ICD-10-CM | POA: Diagnosis not present

## 2020-02-06 LAB — ECHOCARDIOGRAM COMPLETE
Area-P 1/2: 4.21 cm2
Calc EF: 59 %
S' Lateral: 2.9 cm
Single Plane A2C EF: 59.9 %
Single Plane A4C EF: 59.6 %

## 2020-02-06 NOTE — Progress Notes (Signed)
°  Echocardiogram 2D Echocardiogram has been performed.  Elizabeth Sharp 02/06/2020, 11:05 AM

## 2020-02-16 NOTE — Progress Notes (Signed)
Patient Care Team: Rochel Brome, MD as PCP - General (Family Medicine) Rockwell Germany, RN as Oncology Nurse Navigator Mauro Kaufmann, RN as Oncology Nurse Navigator  DIAGNOSIS:    ICD-10-CM   1. Malignant neoplasm of upper-inner quadrant of left breast in female, estrogen receptor positive ([place])  C50.212    Z17.0     SUMMARY OF ONCOLOGIC HISTORY: Oncology History  Malignant neoplasm of upper-inner quadrant of left breast in female, estrogen receptor positive ([place])  04/28/2019 Initial Diagnosis   Palpable left breast lump.  Mammogram 04/21/19 showed a 4.5cm mass at the 11:30 position with smaller adjacent solid masses and one abnormal left axillary lymph node. 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   05/10/2019 Cancer Staging   Staging form: Breast, AJCC 8th Edition - Clinical stage from 05/10/2019: Stage IIB (cT2, cN1, cM0, G3, ER+, PR+, HER2-) - Signed by Nicholas Lose, MD on 05/10/2019   05/19/2019 - 08/19/2019 Chemotherapy   The patient had palonosetron (ALOXI) injection 0.25 mg, 0.25 mg, Intravenous,  Once, 4 of 4 cycles Administration: 0.25 mg (05/19/2019), 0.25 mg (06/09/2019), 0.25 mg (07/08/2019), 0.25 mg (07/30/2019) pegfilgrastim-jmdb (FULPHILA) injection 6 mg, 6 mg, Subcutaneous,  Once, 4 of 4 cycles Administration: 6 mg (05/20/2019), 6 mg (06/11/2019), 6 mg (07/11/2019), 6 mg (08/01/2019) cyclophosphamide (CYTOXAN) 1,280 mg in sodium chloride 0.9 % 250 mL chemo infusion, 600 mg/m2 = 1,280 mg, Intravenous,  Once, 4 of 4 cycles Dose modification: 400 mg/m2 (original dose 600 mg/m2, Cycle 3, Reason: Dose not tolerated) Administration: 1,280 mg (05/19/2019), 1,280 mg (06/09/2019), 840 mg (07/08/2019), 840 mg (07/30/2019) DOCEtaxel (TAXOTERE) 160 mg in sodium chloride 0.9 % 250 mL chemo infusion, 75 mg/m2 = 160 mg, Intravenous,  Once, 4 of 4 cycles Dose modification: 50 mg/m2 (original  dose 75 mg/m2, Cycle 3, Reason: Dose not tolerated) Administration: 160 mg (05/19/2019), 160 mg (06/09/2019), 110 mg (07/08/2019), 110 mg (07/30/2019) fosaprepitant (EMEND) 150 mg in sodium chloride 0.9 % 145 mL IVPB, , Intravenous,  Once, 4 of 4 cycles Administration:  (05/19/2019),  (06/09/2019),  (07/08/2019),  (07/30/2019)  for chemotherapy treatment.    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    09/20/2019 -  Chemotherapy   The patient had trastuzumab-dkst (OGIVRI) 714 mg in sodium chloride 0.9 % 250 mL chemo infusion, 8 mg/kg = 714 mg, Intravenous,  Once, 7 of 17 cycles Administration: 714 mg (09/20/2019), 546 mg (10/11/2019), 546 mg (12/16/2019), 546 mg (01/05/2020), 546 mg (01/27/2020), 546 mg (11/01/2019), 546 mg (11/22/2019)  for chemotherapy treatment.    10/07/2019 - 11/22/2019 Radiation Therapy   Adjuvant radiation   12/16/2019 -  Anti-estrogen oral therapy   Anastrozole, planned duration 7 years     CHIEF COMPLIANT: Herceptin maintenance  INTERVAL HISTORY: Elizabeth Sharp is a 54 y.o. with above-mentioned history of left breast cancertreated withneoadjuvant chemotherapy,left lumpectomy, radiation therapy,and is currently onadjuvant Herceptin and antiestrogen therapy with anastrozole.Echo on 02/06/20 showed an ejection fraction of 55-60%. She presentsto the clinic todayfor treatment.    ALLERGIES:  has No Known Allergies.  MEDICATIONS:  Current Outpatient Medications  Medication Sig Dispense Refill  . anastrozole (ARIMIDEX) 1 MG tablet Take 1 tablet (1 mg total) by mouth daily. (Patient not taking: Reported on 12/29/2019) 90 tablet 3   No current facility-administered medications  for this visit.    PHYSICAL EXAMINATION: ECOG PERFORMANCE STATUS: 1 - Symptomatic but completely ambulatory  Vitals:   02/17/20 1139  BP: 136/89  Pulse: 75  Resp: 18  Temp:  97.6 F (36.4 C)  SpO2: 99%   Filed Weights   02/17/20 1139  Weight: 196 lb 3.2 oz (89 kg)    LABORATORY DATA:  I have reviewed the data as listed CMP Latest Ref Rng & Units 01/27/2020 12/16/2019 11/01/2019  Glucose 70 - 99 mg/dL 109(H) 112(H) 107(H)  BUN 6 - 20 mg/dL '11 14 10  ' Creatinine 0.44 - 1.00 mg/dL 0.67 0.72 0.80  Sodium 135 - 145 mmol/L 140 137 137  Potassium 3.5 - 5.1 mmol/L 4.2 3.7 4.4  Chloride 98 - 111 mmol/L 103 101 104  CO2 22 - 32 mmol/L '25 24 25  ' Calcium 8.9 - 10.3 mg/dL 9.7 9.6 9.9  Total Protein 6.5 - 8.1 g/dL 7.4 7.5 7.9  Total Bilirubin 0.3 - 1.2 mg/dL 0.6 1.7(H) 1.0  Alkaline Phos 38 - 126 U/L 101 104 114  AST 15 - 41 U/L 146(H) 185(HH) 196(HH)  ALT 0 - 44 U/L 161(H) 200(H) 129(H)    Lab Results  Component Value Date   WBC 3.3 (L) 01/27/2020   HGB 11.1 (L) 01/27/2020   HCT 34.9 (L) 01/27/2020   MCV 89.9 01/27/2020   PLT 245 01/27/2020   NEUTROABS 1.8 01/27/2020    ASSESSMENT & PLAN:  Malignant neoplasm of upper-inner quadrant of left breast in female, estrogen receptor positive ([place]) 04/28/2019: Palpable left breast lump, mammogram 4.5 cm mass at 11:30 position and adjacent smaller masses, 1 abnormal left axillary lymph node: Biopsy of breast and axilla both positive for grade 3 IDC ER 95%, PR 90%, Ki-67 70%, HER-2 negative T2N1 stage IIb clinical stage  Recommendation: 1.Neoadjuvant chemotherapywith Taxotere and Cytoxan every 3 weeks x4completed 07/30/2019, Adjuvant Herceptinstarted 09/20/2019 2.Breast conserving surgery with targeted node dissection3/23/2021 3.Adjuvant radiation therapy 10/07/2019-12/15/2019 4.Followed by adjuvant antiestrogen therapy Starting December 16, 2019 --------------------------------------------------------------------------------------------------------------------------------------------------------- Post neoadjuvant breast MRI 08/05/2019: Left breast cancer previously 3.8 cm is now 2.5 cm now ill-defined and less  confluent. Retroareolar mass 1.3 cm is now minimal enhancement. Left axilla lymph node abnormal  Left lumpectomy 08/30/2019: Left upper outer quadrant: Grade 3 IDC 1.5 cm with high-grade DCIS, margins negative Left lateral lumpectomy: Grade 3 IDC microscopic focus margins negative, 1/3 lymph nodes positive Repeat prognostic panel: ER 50% moderate, PR 10% strong, Ki-67 2%, HER-2 equivocal by IHC FISH positive for HER-2 ratio 3.5 and copy #5.55. (Heterogeneity in HER-2 expression)  Current treatment: Adjuvant Herceptin every [redacted] weeks along with anastrozole Herceptin toxicities: None, Herceptin to be completed March 2022 Echocardiogram 02/06/2020: EF 55 to 60%  Anastrozole toxicities: Denies any hot flashes or myalgias.  Return to clinic every 3 weeks for Herceptin every 6 weeks to follow-up with me.    No orders of the defined types were placed in this encounter.  The patient has a good understanding of the overall plan. she agrees with it. she will call with any problems that may develop before the next visit here.  Total time spent: 30 mins including face to face time and time spent for planning, charting and coordination of care  Nicholas Lose, MD 02/17/2020  I, Cloyde Reams Dorshimer, am acting as scribe for Dr. Nicholas Lose.  I have reviewed the above documentation for accuracy and completeness, and I agree with the above.

## 2020-02-17 ENCOUNTER — Other Ambulatory Visit: Payer: BC Managed Care – PPO

## 2020-02-17 ENCOUNTER — Inpatient Hospital Stay: Payer: BC Managed Care – PPO

## 2020-02-17 ENCOUNTER — Ambulatory Visit: Payer: BC Managed Care – PPO | Admitting: Gastroenterology

## 2020-02-17 ENCOUNTER — Other Ambulatory Visit: Payer: Self-pay

## 2020-02-17 ENCOUNTER — Inpatient Hospital Stay: Payer: BC Managed Care – PPO | Attending: Hematology and Oncology | Admitting: Hematology and Oncology

## 2020-02-17 DIAGNOSIS — C50212 Malignant neoplasm of upper-inner quadrant of left female breast: Secondary | ICD-10-CM | POA: Diagnosis not present

## 2020-02-17 DIAGNOSIS — Z79899 Other long term (current) drug therapy: Secondary | ICD-10-CM | POA: Insufficient documentation

## 2020-02-17 DIAGNOSIS — C773 Secondary and unspecified malignant neoplasm of axilla and upper limb lymph nodes: Secondary | ICD-10-CM | POA: Insufficient documentation

## 2020-02-17 DIAGNOSIS — Z170 Estrogen receptor positive status [ER+]: Secondary | ICD-10-CM | POA: Diagnosis not present

## 2020-02-17 DIAGNOSIS — Z5112 Encounter for antineoplastic immunotherapy: Secondary | ICD-10-CM | POA: Insufficient documentation

## 2020-02-17 MED ORDER — ACETAMINOPHEN 325 MG PO TABS
650.0000 mg | ORAL_TABLET | Freq: Once | ORAL | Status: AC
  Administered 2020-02-17: 650 mg via ORAL

## 2020-02-17 MED ORDER — SODIUM CHLORIDE 0.9 % IV SOLN
Freq: Once | INTRAVENOUS | Status: AC
  Filled 2020-02-17: qty 250

## 2020-02-17 MED ORDER — DIPHENHYDRAMINE HCL 25 MG PO CAPS
ORAL_CAPSULE | ORAL | Status: AC
  Filled 2020-02-17: qty 2

## 2020-02-17 MED ORDER — TRASTUZUMAB-DKST CHEMO 150 MG IV SOLR
6.0000 mg/kg | Freq: Once | INTRAVENOUS | Status: AC
  Administered 2020-02-17: 546 mg via INTRAVENOUS
  Filled 2020-02-17: qty 26

## 2020-02-17 MED ORDER — HEPARIN SOD (PORK) LOCK FLUSH 100 UNIT/ML IV SOLN
500.0000 [IU] | Freq: Once | INTRAVENOUS | Status: AC | PRN
  Administered 2020-02-17: 500 [IU]
  Filled 2020-02-17: qty 5

## 2020-02-17 MED ORDER — SODIUM CHLORIDE 0.9% FLUSH
10.0000 mL | INTRAVENOUS | Status: DC | PRN
  Administered 2020-02-17: 10 mL
  Filled 2020-02-17: qty 10

## 2020-02-17 MED ORDER — LIDOCAINE-PRILOCAINE 2.5-2.5 % EX CREA: 1.0000 "application " | TOPICAL_CREAM | CUTANEOUS | 1 refills | Status: AC

## 2020-02-17 MED ORDER — ACETAMINOPHEN 325 MG PO TABS
ORAL_TABLET | ORAL | Status: AC
  Filled 2020-02-17: qty 2

## 2020-02-17 MED ORDER — DIPHENHYDRAMINE HCL 25 MG PO CAPS
50.0000 mg | ORAL_CAPSULE | Freq: Once | ORAL | Status: AC
  Administered 2020-02-17: 50 mg via ORAL

## 2020-02-17 NOTE — Patient Instructions (Signed)
[facility] Discharge Instructions for Patients Receiving Chemotherapy  Today you received the following chemotherapy agents trastuzumab.  To help prevent nausea and vomiting after your treatment, we encourage you to take your nausea medication as directed.    If you develop nausea and vomiting that is not controlled by your nausea medication, call the clinic.   BELOW ARE SYMPTOMS THAT SHOULD BE REPORTED IMMEDIATELY:  *FEVER GREATER THAN 100.5 F  *CHILLS WITH OR WITHOUT FEVER  NAUSEA AND VOMITING THAT IS NOT CONTROLLED WITH YOUR NAUSEA MEDICATION  *UNUSUAL SHORTNESS OF BREATH  *UNUSUAL BRUISING OR BLEEDING  TENDERNESS IN MOUTH AND THROAT WITH OR WITHOUT PRESENCE OF ULCERS  *URINARY PROBLEMS  *BOWEL PROBLEMS  UNUSUAL RASH Items with * indicate a potential emergency and should be followed up as soon as possible.  Feel free to call the clinic should you have any questions or concerns. The clinic phone number is [phone].  Please show the CHEMO ALERT CARD at check-in to the Emergency Department and triage nurse.   

## 2020-02-17 NOTE — Assessment & Plan Note (Signed)
04/28/2019: Palpable left breast lump, mammogram 4.5 cm mass at 11:30 position and adjacent smaller masses, 1 abnormal left axillary lymph node: Biopsy of breast and axilla both positive for grade 3 IDC ER 95%, PR 90%, Ki-67 70%, HER-2 negative T2N1 stage IIb clinical stage  Recommendation: 1.Neoadjuvant chemotherapywith Taxotere and Cytoxan every 3 weeks x4completed 07/30/2019, Adjuvant Herceptinstarted 09/20/2019 2.Breast conserving surgery with targeted node dissection3/23/2021 3.Adjuvant radiation therapy 10/07/2019-12/15/2019 4.Followed by adjuvant antiestrogen therapy Starting December 16, 2019 --------------------------------------------------------------------------------------------------------------------------------------------------------- Post neoadjuvant breast MRI 08/05/2019: Left breast cancer previously 3.8 cm is now 2.5 cm now ill-defined and less confluent. Retroareolar mass 1.3 cm is now minimal enhancement. Left axilla lymph node abnormal  Left lumpectomy 08/30/2019: Left upper outer quadrant: Grade 3 IDC 1.5 cm with high-grade DCIS, margins negative Left lateral lumpectomy: Grade 3 IDC microscopic focus margins negative, 1/3 lymph nodes positive Repeat prognostic panel: ER 50% moderate, PR 10% strong, Ki-67 2%, HER-2 equivocal by IHC FISH positive for HER-2 ratio 3.5 and copy #5.55. (Heterogeneity in HER-2 expression)  Current treatment: Adjuvant Herceptin every [redacted] weeks along with anastrozole Herceptin toxicities: None  Anastrozole toxicities: Denies any hot flashes or myalgias.  Return to clinic every 3 weeks for Herceptin every 6 weeks to follow-up with me.

## 2020-02-21 ENCOUNTER — Telehealth: Payer: Self-pay | Admitting: Hematology and Oncology

## 2020-02-21 NOTE — Telephone Encounter (Signed)
Scheduled per 9/10 los. Called and spoke with pt, confirmed all added appts

## 2020-03-09 ENCOUNTER — Other Ambulatory Visit: Payer: Self-pay

## 2020-03-09 ENCOUNTER — Ambulatory Visit: Payer: BC Managed Care – PPO

## 2020-03-09 ENCOUNTER — Other Ambulatory Visit: Payer: BC Managed Care – PPO

## 2020-03-09 ENCOUNTER — Inpatient Hospital Stay: Payer: BC Managed Care – PPO | Attending: Hematology and Oncology

## 2020-03-09 ENCOUNTER — Ambulatory Visit: Payer: BC Managed Care – PPO | Admitting: Hematology and Oncology

## 2020-03-09 VITALS — BP 149/80 | HR 81 | Temp 98.7°F | Resp 20

## 2020-03-09 DIAGNOSIS — Z170 Estrogen receptor positive status [ER+]: Secondary | ICD-10-CM | POA: Insufficient documentation

## 2020-03-09 DIAGNOSIS — Z79899 Other long term (current) drug therapy: Secondary | ICD-10-CM | POA: Insufficient documentation

## 2020-03-09 DIAGNOSIS — R7989 Other specified abnormal findings of blood chemistry: Secondary | ICD-10-CM | POA: Diagnosis not present

## 2020-03-09 DIAGNOSIS — Z5112 Encounter for antineoplastic immunotherapy: Secondary | ICD-10-CM | POA: Insufficient documentation

## 2020-03-09 DIAGNOSIS — C773 Secondary and unspecified malignant neoplasm of axilla and upper limb lymph nodes: Secondary | ICD-10-CM | POA: Diagnosis not present

## 2020-03-09 DIAGNOSIS — Z20822 Contact with and (suspected) exposure to covid-19: Secondary | ICD-10-CM | POA: Diagnosis not present

## 2020-03-09 DIAGNOSIS — C50212 Malignant neoplasm of upper-inner quadrant of left female breast: Secondary | ICD-10-CM | POA: Diagnosis not present

## 2020-03-09 MED ORDER — TRASTUZUMAB-DKST CHEMO 150 MG IV SOLR
6.0000 mg/kg | Freq: Once | INTRAVENOUS | Status: AC
  Administered 2020-03-09: 546 mg via INTRAVENOUS
  Filled 2020-03-09: qty 26

## 2020-03-09 MED ORDER — SODIUM CHLORIDE 0.9% FLUSH
10.0000 mL | INTRAVENOUS | Status: DC | PRN
  Administered 2020-03-09: 10 mL
  Filled 2020-03-09: qty 10

## 2020-03-09 MED ORDER — SODIUM CHLORIDE 0.9 % IV SOLN
Freq: Once | INTRAVENOUS | Status: AC
  Filled 2020-03-09: qty 250

## 2020-03-09 MED ORDER — ACETAMINOPHEN 325 MG PO TABS
650.0000 mg | ORAL_TABLET | Freq: Once | ORAL | Status: AC
  Administered 2020-03-09: 650 mg via ORAL

## 2020-03-09 MED ORDER — DIPHENHYDRAMINE HCL 25 MG PO CAPS
50.0000 mg | ORAL_CAPSULE | Freq: Once | ORAL | Status: AC
  Administered 2020-03-09: 50 mg via ORAL

## 2020-03-09 MED ORDER — HEPARIN SOD (PORK) LOCK FLUSH 100 UNIT/ML IV SOLN
500.0000 [IU] | Freq: Once | INTRAVENOUS | Status: AC | PRN
  Administered 2020-03-09: 500 [IU]
  Filled 2020-03-09: qty 5

## 2020-03-09 MED ORDER — DIPHENHYDRAMINE HCL 25 MG PO CAPS
ORAL_CAPSULE | ORAL | Status: AC
  Filled 2020-03-09: qty 2

## 2020-03-09 MED ORDER — ACETAMINOPHEN 325 MG PO TABS
ORAL_TABLET | ORAL | Status: AC
  Filled 2020-03-09: qty 2

## 2020-03-09 NOTE — Patient Instructions (Signed)
[facility] Discharge Instructions for Patients Receiving Chemotherapy  Today you received the following chemotherapy agents trastuzumab-dkst   To help prevent nausea and vomiting after your treatment, we encourage you to take your nausea medication as directed.   If you develop nausea and vomiting that is not controlled by your nausea medication, call the clinic.   BELOW ARE SYMPTOMS THAT SHOULD BE REPORTED IMMEDIATELY:  *FEVER GREATER THAN 100.5 F  *CHILLS WITH OR WITHOUT FEVER  NAUSEA AND VOMITING THAT IS NOT CONTROLLED WITH YOUR NAUSEA MEDICATION  *UNUSUAL SHORTNESS OF BREATH  *UNUSUAL BRUISING OR BLEEDING  TENDERNESS IN MOUTH AND THROAT WITH OR WITHOUT PRESENCE OF ULCERS  *URINARY PROBLEMS  *BOWEL PROBLEMS  UNUSUAL RASH Items with * indicate a potential emergency and should be followed up as soon as possible.  Feel free to call the clinic should you have any questions or concerns. The clinic phone number is [phone].  Please show the CHEMO ALERT CARD at check-in to the Emergency Department and triage nurse.   

## 2020-03-26 ENCOUNTER — Encounter: Payer: Self-pay | Admitting: Hematology and Oncology

## 2020-03-29 NOTE — Progress Notes (Signed)
Patient Care Team: Rochel Brome, MD as PCP - General (Family Medicine) Rockwell Germany, RN as Oncology Nurse Navigator Mauro Kaufmann, RN as Oncology Nurse Navigator  DIAGNOSIS:    ICD-10-CM   1. Malignant neoplasm of upper-inner quadrant of left breast in female, estrogen receptor positive ([place])  C50.212    Z17.0     SUMMARY OF ONCOLOGIC HISTORY: Oncology History  Malignant neoplasm of upper-inner quadrant of left breast in female, estrogen receptor positive ([place])  04/28/2019 Initial Diagnosis   Palpable left breast lump.  Mammogram 04/21/19 showed a 4.5cm mass at the 11:30 position with smaller adjacent solid masses and one abnormal left axillary lymph node. 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   05/10/2019 Cancer Staging   Staging form: Breast, AJCC 8th Edition - Clinical stage from 05/10/2019: Stage IIB (cT2, cN1, cM0, G3, ER+, PR+, HER2-) - Signed by Nicholas Lose, MD on 05/10/2019   05/19/2019 - 08/01/2019 Chemotherapy   The patient had dexamethasone (DECADRON) 4 MG tablet, 4 mg (100 % of original dose 4 mg), Oral, Daily, 1 of 1 cycle, Start date: 06/20/2019, End date: 08/22/2019 Dose modification: 4 mg (original dose 4 mg, Cycle 0) palonosetron (ALOXI) injection 0.25 mg, 0.25 mg, Intravenous,  Once, 4 of 4 cycles Administration: 0.25 mg (05/19/2019), 0.25 mg (06/09/2019), 0.25 mg (07/08/2019), 0.25 mg (07/30/2019) pegfilgrastim-jmdb (FULPHILA) injection 6 mg, 6 mg, Subcutaneous,  Once, 4 of 4 cycles Administration: 6 mg (05/20/2019), 6 mg (06/11/2019), 6 mg (07/11/2019), 6 mg (08/01/2019) cyclophosphamide (CYTOXAN) 1,280 mg in sodium chloride 0.9 % 250 mL chemo infusion, 600 mg/m2 = 1,280 mg, Intravenous,  Once, 4 of 4 cycles Dose modification: 400 mg/m2 (original dose 600 mg/m2, Cycle 3, Reason: Dose not tolerated) Administration: 1,280 mg (05/19/2019), 1,280 mg (06/09/2019), 840 mg  (07/08/2019), 840 mg (07/30/2019) DOCEtaxel (TAXOTERE) 160 mg in sodium chloride 0.9 % 250 mL chemo infusion, 75 mg/m2 = 160 mg, Intravenous,  Once, 4 of 4 cycles Dose modification: 50 mg/m2 (original dose 75 mg/m2, Cycle 3, Reason: Dose not tolerated) Administration: 160 mg (05/19/2019), 160 mg (06/09/2019), 110 mg (07/08/2019), 110 mg (07/30/2019) fosaprepitant (EMEND) 150 mg in sodium chloride 0.9 % 145 mL IVPB, , Intravenous,  Once, 4 of 4 cycles Administration:  (05/19/2019),  (06/09/2019),  (07/08/2019),  (07/30/2019)  for chemotherapy treatment.    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    09/20/2019 -  Chemotherapy   The patient had trastuzumab-dkst (OGIVRI) 714 mg in sodium chloride 0.9 % 250 mL chemo infusion, 8 mg/kg = 714 mg, Intravenous,  Once, 9 of 17 cycles Administration: 714 mg (09/20/2019), 546 mg (10/11/2019), 546 mg (12/16/2019), 546 mg (01/05/2020), 546 mg (01/27/2020), 546 mg (02/17/2020), 546 mg (03/09/2020), 546 mg (11/01/2019), 546 mg (11/22/2019)  for chemotherapy treatment.    10/07/2019 - 11/22/2019 Radiation Therapy   Adjuvant radiation   12/16/2019 -  Anti-estrogen oral therapy   Anastrozole, planned duration 7 years     CHIEF COMPLIANT: Herceptin maintenance  INTERVAL HISTORY: Elizabeth Sharp is a 54 y.o. with above-mentioned history of left breast cancertreated withneoadjuvant chemotherapy,left lumpectomy, radiation therapy,and is currently onadjuvant Herceptinand antiestrogen therapy with anastrozole. She presentsto the clinic todayfor treatment.  She tells me that her daughter was diagnosed with Covid but she herself has not had Covid  infection.  She does not have any symptoms of COVID-19.  She tested negative for Covid.  ALLERGIES:  has No Known Allergies.  MEDICATIONS:  Current Outpatient Medications  Medication Sig Dispense Refill  .  anastrozole (ARIMIDEX) 1 MG tablet Take 1 tablet (1 mg total) by mouth daily. (Patient not taking: Reported on 12/29/2019) 90 tablet 3  . lidocaine-prilocaine (EMLA) cream Apply 1 application topically every 21 ( twenty-one) days. 30 g 1   No current facility-administered medications for this visit.    PHYSICAL EXAMINATION: ECOG PERFORMANCE STATUS: 1 - Symptomatic but completely ambulatory  Vitals:   03/30/20 1137  BP: (!) 153/91  Pulse: 88  Resp: 18  Temp: 98.1 F (36.7 C)  SpO2: 98%   Filed Weights   03/30/20 1137  Weight: 199 lb 11.2 oz (90.6 kg)    LABORATORY DATA:  I have reviewed the data as listed CMP Latest Ref Rng & Units 03/30/2020 01/27/2020 12/16/2019  Glucose 70 - 99 mg/dL 101(H) 109(H) 112(H)  BUN 6 - 20 mg/dL '11 11 14  ' Creatinine 0.44 - 1.00 mg/dL 0.72 0.67 0.72  Sodium 135 - 145 mmol/L 138 140 137  Potassium 3.5 - 5.1 mmol/L 3.8 4.2 3.7  Chloride 98 - 111 mmol/L 103 103 101  CO2 22 - 32 mmol/L '24 25 24  ' Calcium 8.9 - 10.3 mg/dL 9.3 9.7 9.6  Total Protein 6.5 - 8.1 g/dL 7.1 7.4 7.5  Total Bilirubin 0.3 - 1.2 mg/dL 0.6 0.6 1.7(H)  Alkaline Phos 38 - 126 U/L 101 101 104  AST 15 - 41 U/L 79(H) 146(H) 185(HH)  ALT 0 - 44 U/L 101(H) 161(H) 200(H)    Lab Results  Component Value Date   WBC 2.9 (L) 03/30/2020   HGB 11.5 (L) 03/30/2020   HCT 35.5 (L) 03/30/2020   MCV 90.3 03/30/2020   PLT 247 03/30/2020   NEUTROABS 1.9 03/30/2020    ASSESSMENT & PLAN:  Malignant neoplasm of upper-inner quadrant of left breast in female, estrogen receptor positive ([place]) 04/28/2019: Palpable left breast lump, mammogram 4.5 cm mass at 11:30 position and adjacent smaller masses, 1 abnormal left axillary lymph node: Biopsy of breast and axilla both positive for grade 3 IDC ER 95%, PR 90%, Ki-67 70%, HER-2 negative T2N1 stage IIb clinical stage  Recommendation: 1.Neoadjuvant chemotherapywith Taxotere and Cytoxan every 3 weeks x4completed 07/30/2019,Adjuvant Herceptinstarted  09/20/2019 2.Breast conserving surgery with targeted node dissection3/23/2021 3.Adjuvant radiation therapy 10/07/2019-12/15/2019 4.Followed by adjuvant antiestrogen therapy Starting December 16, 2019 --------------------------------------------------------------------------------------------------------------------------------------------------------- Post neoadjuvant breast MRI 08/05/2019: Left breast cancer previously 3.8 cm is now 2.5 cm now ill-defined and less confluent. Retroareolar mass 1.3 cm is now minimal enhancement. Left axilla lymph node abnormal  Left lumpectomy 08/30/2019: Left upper outer quadrant: Grade 3 IDC 1.5 cm with high-grade DCIS, margins negative Left lateral lumpectomy: Grade 3 IDC microscopic focus margins negative, 1/3 lymph nodes positive Repeat prognostic panel: ER 50% moderate, PR 10% strong, Ki-67 2%, HER-2 equivocal by IHC FISH positive for HER-2 ratio 3.5 and copy #5.55. (Heterogeneity in HER-2 expression)  Current treatment:Adjuvant Herceptin every [redacted] weeks along with anastrozole Herceptin toxicities: None, Herceptin to be completed March 2022 Echocardiogram 02/06/2020: EF 55 to 60%  Elevated LFTs: Slowly coming down.  Okay to treat.  Anastrozoletoxicities:Denies any hot flashes or myalgias.  Return to clinic every 3 weeks for Herceptin every 6 weeks to follow-up with me.    No orders of the defined types were placed in this encounter.  The patient has a good understanding  of the overall plan. she agrees with it. she will call with any problems that may develop before the next visit here.  Total time spent: 30 mins including face to face time and time spent for planning, charting and coordination of care  Nicholas Lose, MD 03/30/2020  I, Cloyde Reams Dorshimer, am acting as scribe for Dr. Nicholas Lose.  I have reviewed the above documentation for accuracy and completeness, and I agree with the above.

## 2020-03-30 ENCOUNTER — Inpatient Hospital Stay: Payer: BC Managed Care – PPO

## 2020-03-30 ENCOUNTER — Other Ambulatory Visit: Payer: BC Managed Care – PPO

## 2020-03-30 ENCOUNTER — Encounter: Payer: Self-pay | Admitting: *Deleted

## 2020-03-30 ENCOUNTER — Other Ambulatory Visit: Payer: Self-pay

## 2020-03-30 ENCOUNTER — Inpatient Hospital Stay: Payer: BC Managed Care – PPO | Admitting: Hematology and Oncology

## 2020-03-30 ENCOUNTER — Ambulatory Visit: Payer: BC Managed Care – PPO

## 2020-03-30 DIAGNOSIS — Z170 Estrogen receptor positive status [ER+]: Secondary | ICD-10-CM

## 2020-03-30 DIAGNOSIS — C50212 Malignant neoplasm of upper-inner quadrant of left female breast: Secondary | ICD-10-CM

## 2020-03-30 DIAGNOSIS — Z79899 Other long term (current) drug therapy: Secondary | ICD-10-CM | POA: Diagnosis not present

## 2020-03-30 DIAGNOSIS — C773 Secondary and unspecified malignant neoplasm of axilla and upper limb lymph nodes: Secondary | ICD-10-CM | POA: Diagnosis not present

## 2020-03-30 DIAGNOSIS — Z20822 Contact with and (suspected) exposure to covid-19: Secondary | ICD-10-CM | POA: Diagnosis not present

## 2020-03-30 DIAGNOSIS — R7989 Other specified abnormal findings of blood chemistry: Secondary | ICD-10-CM | POA: Diagnosis not present

## 2020-03-30 DIAGNOSIS — Z5112 Encounter for antineoplastic immunotherapy: Secondary | ICD-10-CM | POA: Diagnosis not present

## 2020-03-30 DIAGNOSIS — Z95828 Presence of other vascular implants and grafts: Secondary | ICD-10-CM

## 2020-03-30 LAB — CMP (CANCER CENTER ONLY)
ALT: 101 U/L — ABNORMAL HIGH (ref 0–44)
AST: 79 U/L — ABNORMAL HIGH (ref 15–41)
Albumin: 3.6 g/dL (ref 3.5–5.0)
Alkaline Phosphatase: 101 U/L (ref 38–126)
Anion gap: 11 (ref 5–15)
BUN: 11 mg/dL (ref 6–20)
CO2: 24 mmol/L (ref 22–32)
Calcium: 9.3 mg/dL (ref 8.9–10.3)
Chloride: 103 mmol/L (ref 98–111)
Creatinine: 0.72 mg/dL (ref 0.44–1.00)
GFR, Estimated: >60 mL/min (ref >60)
Glucose, Bld: 101 mg/dL — ABNORMAL HIGH (ref 70–99)
Potassium: 3.8 mmol/L (ref 3.5–5.1)
Sodium: 138 mmol/L (ref 135–145)
Total Bilirubin: 0.6 mg/dL (ref 0.3–1.2)
Total Protein: 7.1 g/dL (ref 6.5–8.1)

## 2020-03-30 LAB — CBC WITH DIFFERENTIAL (CANCER CENTER ONLY)
Abs Immature Granulocytes: 0.01 10*3/uL (ref 0.00–0.07)
Basophils Absolute: 0 10*3/uL (ref 0.0–0.1)
Basophils Relative: 1 %
Eosinophils Absolute: 0.1 10*3/uL (ref 0.0–0.5)
Eosinophils Relative: 4 %
HCT: 35.5 % — ABNORMAL LOW (ref 36.0–46.0)
Hemoglobin: 11.5 g/dL — ABNORMAL LOW (ref 12.0–15.0)
Immature Granulocytes: 0 %
Lymphocytes Relative: 16 %
Lymphs Abs: 0.5 10*3/uL — ABNORMAL LOW (ref 0.7–4.0)
MCH: 29.3 pg (ref 26.0–34.0)
MCHC: 32.4 g/dL (ref 30.0–36.0)
MCV: 90.3 fL (ref 80.0–100.0)
Monocytes Absolute: 0.4 10*3/uL (ref 0.1–1.0)
Monocytes Relative: 13 %
Neutro Abs: 1.9 10*3/uL (ref 1.7–7.7)
Neutrophils Relative %: 66 %
Platelet Count: 247 10*3/uL (ref 150–400)
RBC: 3.93 MIL/uL (ref 3.87–5.11)
RDW: 15.9 % — ABNORMAL HIGH (ref 11.5–15.5)
WBC Count: 2.9 10*3/uL — ABNORMAL LOW (ref 4.0–10.5)
nRBC: 0 % (ref 0.0–0.2)

## 2020-03-30 MED ORDER — ALTEPLASE 2 MG IJ SOLR
INTRAMUSCULAR | Status: AC
  Filled 2020-03-30: qty 2

## 2020-03-30 MED ORDER — DIPHENHYDRAMINE HCL 25 MG PO CAPS
ORAL_CAPSULE | ORAL | Status: AC
  Filled 2020-03-30: qty 2

## 2020-03-30 MED ORDER — DIPHENHYDRAMINE HCL 25 MG PO CAPS
50.0000 mg | ORAL_CAPSULE | Freq: Once | ORAL | Status: AC
  Administered 2020-03-30: 50 mg via ORAL

## 2020-03-30 MED ORDER — ALTEPLASE 2 MG IJ SOLR
2.0000 mg | Freq: Once | INTRAMUSCULAR | Status: AC
  Administered 2020-03-30: 2 mg
  Filled 2020-03-30: qty 2

## 2020-03-30 MED ORDER — SODIUM CHLORIDE 0.9 % IV SOLN
Freq: Once | INTRAVENOUS | Status: AC
  Filled 2020-03-30: qty 250

## 2020-03-30 MED ORDER — ACETAMINOPHEN 325 MG PO TABS
650.0000 mg | ORAL_TABLET | Freq: Once | ORAL | Status: AC
  Administered 2020-03-30: 650 mg via ORAL

## 2020-03-30 MED ORDER — SODIUM CHLORIDE 0.9% FLUSH
10.0000 mL | Freq: Once | INTRAVENOUS | Status: AC
  Administered 2020-03-30: 10 mL
  Filled 2020-03-30: qty 10

## 2020-03-30 MED ORDER — HEPARIN SOD (PORK) LOCK FLUSH 100 UNIT/ML IV SOLN
500.0000 [IU] | Freq: Once | INTRAVENOUS | Status: AC | PRN
  Administered 2020-03-30: 500 [IU]
  Filled 2020-03-30: qty 5

## 2020-03-30 MED ORDER — TRASTUZUMAB-DKST CHEMO 150 MG IV SOLR
6.0000 mg/kg | Freq: Once | INTRAVENOUS | Status: AC
  Administered 2020-03-30: 546 mg via INTRAVENOUS
  Filled 2020-03-30: qty 26

## 2020-03-30 MED ORDER — ACETAMINOPHEN 325 MG PO TABS
ORAL_TABLET | ORAL | Status: AC
  Filled 2020-03-30: qty 2

## 2020-03-30 MED ORDER — SODIUM CHLORIDE 0.9% FLUSH
10.0000 mL | INTRAVENOUS | Status: DC | PRN
  Administered 2020-03-30: 10 mL
  Filled 2020-03-30: qty 10

## 2020-03-30 NOTE — Progress Notes (Signed)
Minimum blood return noted. Cath flow admistered at 1125am by Larry Sierras. Labs drawn peripheral by Chasidy R. LPN

## 2020-03-30 NOTE — Patient Instructions (Signed)
[facility] Discharge Instructions for Patients Receiving Chemotherapy  Today you received the following chemotherapy agents trastuzumab.  To help prevent nausea and vomiting after your treatment, we encourage you to take your nausea medication as directed.    If you develop nausea and vomiting that is not controlled by your nausea medication, call the clinic.   BELOW ARE SYMPTOMS THAT SHOULD BE REPORTED IMMEDIATELY:  *FEVER GREATER THAN 100.5 F  *CHILLS WITH OR WITHOUT FEVER  NAUSEA AND VOMITING THAT IS NOT CONTROLLED WITH YOUR NAUSEA MEDICATION  *UNUSUAL SHORTNESS OF BREATH  *UNUSUAL BRUISING OR BLEEDING  TENDERNESS IN MOUTH AND THROAT WITH OR WITHOUT PRESENCE OF ULCERS  *URINARY PROBLEMS  *BOWEL PROBLEMS  UNUSUAL RASH Items with * indicate a potential emergency and should be followed up as soon as possible.  Feel free to call the clinic should you have any questions or concerns. The clinic phone number is [phone].  Please show the CHEMO ALERT CARD at check-in to the Emergency Department and triage nurse.   

## 2020-03-30 NOTE — Assessment & Plan Note (Signed)
04/28/2019: Palpable left breast lump, mammogram 4.5 cm mass at 11:30 position and adjacent smaller masses, 1 abnormal left axillary lymph node: Biopsy of breast and axilla both positive for grade 3 IDC ER 95%, PR 90%, Ki-67 70%, HER-2 negative T2N1 stage IIb clinical stage  Recommendation: 1.Neoadjuvant chemotherapywith Taxotere and Cytoxan every 3 weeks x4completed 07/30/2019,Adjuvant Herceptinstarted 09/20/2019 2.Breast conserving surgery with targeted node dissection3/23/2021 3.Adjuvant radiation therapy 10/07/2019-12/15/2019 4.Followed by adjuvant antiestrogen therapy Starting December 16, 2019 --------------------------------------------------------------------------------------------------------------------------------------------------------- Post neoadjuvant breast MRI 08/05/2019: Left breast cancer previously 3.8 cm is now 2.5 cm now ill-defined and less confluent. Retroareolar mass 1.3 cm is now minimal enhancement. Left axilla lymph node abnormal  Left lumpectomy 08/30/2019: Left upper outer quadrant: Grade 3 IDC 1.5 cm with high-grade DCIS, margins negative Left lateral lumpectomy: Grade 3 IDC microscopic focus margins negative, 1/3 lymph nodes positive Repeat prognostic panel: ER 50% moderate, PR 10% strong, Ki-67 2%, HER-2 equivocal by IHC FISH positive for HER-2 ratio 3.5 and copy #5.55. (Heterogeneity in HER-2 expression)  Current treatment:Adjuvant Herceptin every [redacted] weeks along with anastrozole Herceptin toxicities: None, Herceptin to be completed March 2022 Echocardiogram 02/06/2020: EF 55 to 60%  Anastrozoletoxicities:Denies any hot flashes or myalgias.  Return to clinic every 3 weeks for Herceptin every 6 weeks to follow-up with me.

## 2020-03-30 NOTE — Patient Instructions (Signed)

## 2020-04-02 IMAGING — MG MM BREAST LOCALIZATION CLIP
8 series · 8 of 24 positions shown · non-contrast
Comparison: Previous exam(s).

CLINICAL DATA: Status post MR guided core needle biopsy of a 1.4 cm
area of linear non mass enhancement in the lower outer quadrant of
the right breast, anteriorly.

EXAM:
DIAGNOSTIC RIGHT MAMMOGRAM POST MRI BIOPSY

[R CC synth-2D (1 of 2)]
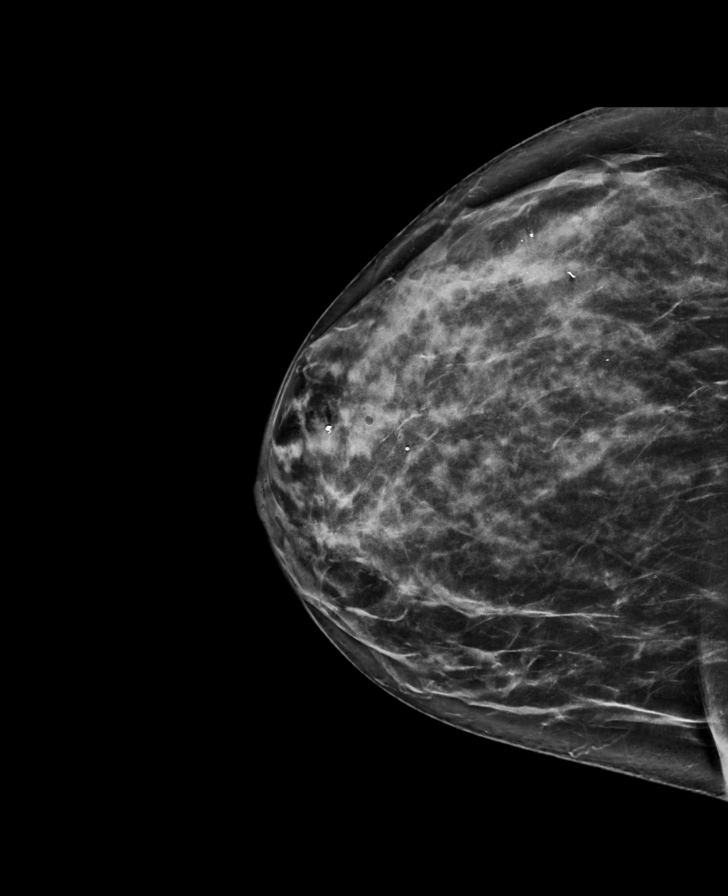

[R ML synth-2D (1 of 2)]
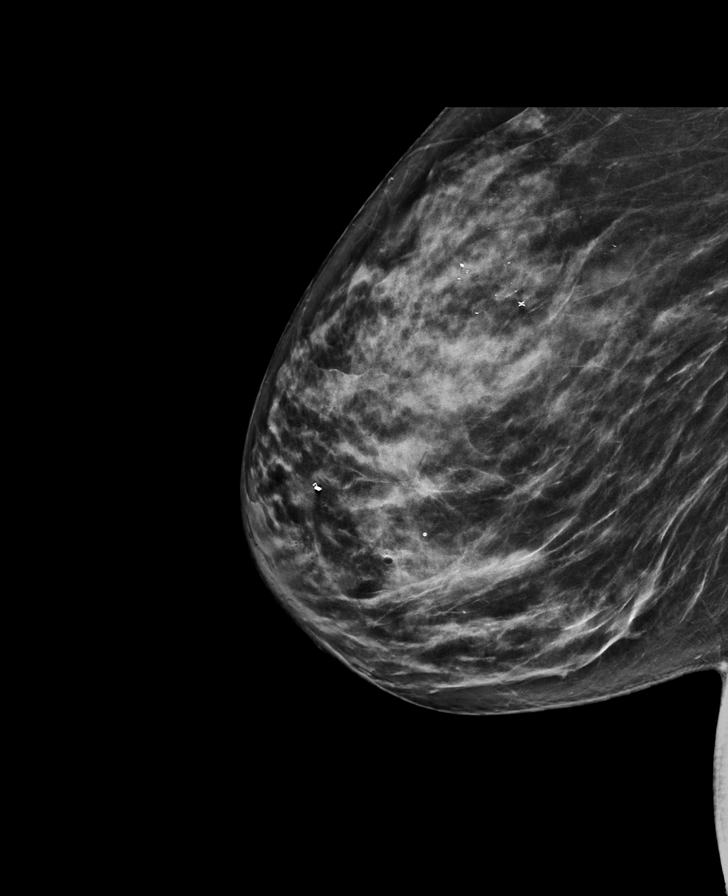

[R CC synth-2D (2 of 2)]
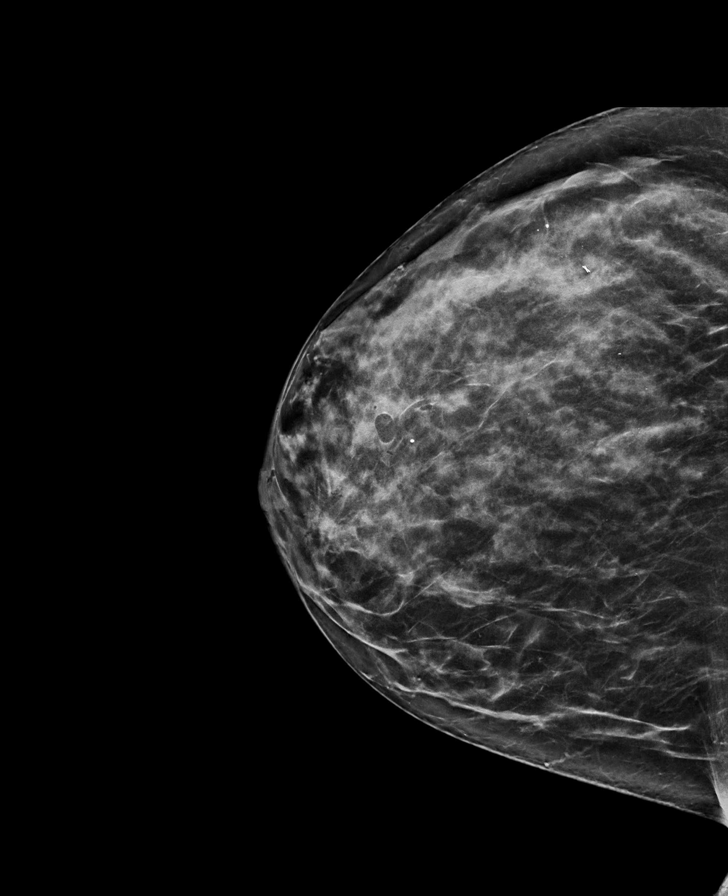

[R ML synth-2D (2 of 2)]
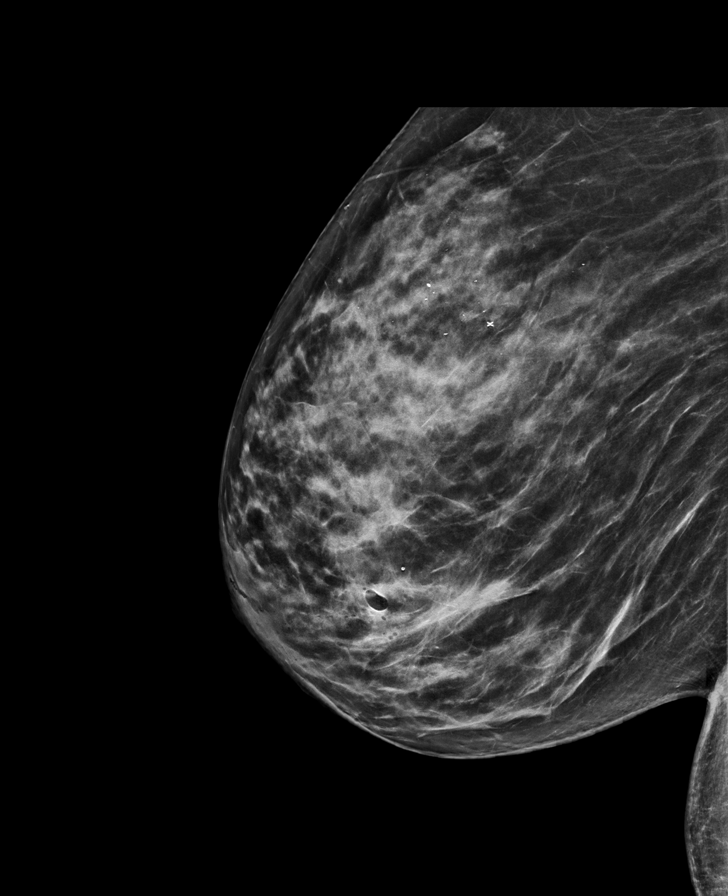

[R CC tomo (1 of 2) · tomo slice 37/74.0]
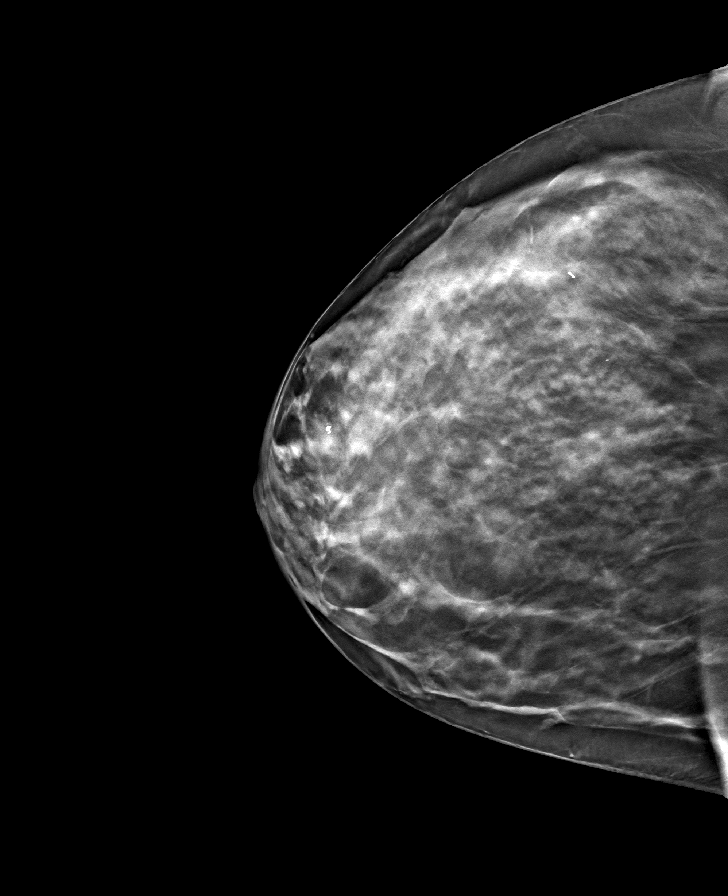

[R ML tomo (1 of 2) · tomo slice 37/73.0]
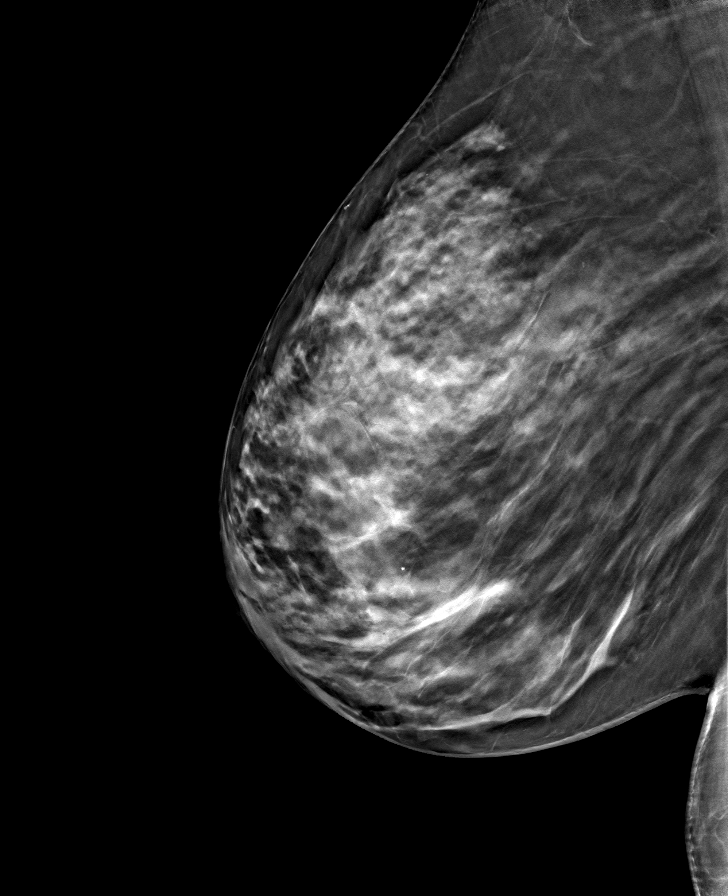

[R ML tomo (2 of 2) · tomo slice 39/76.0]
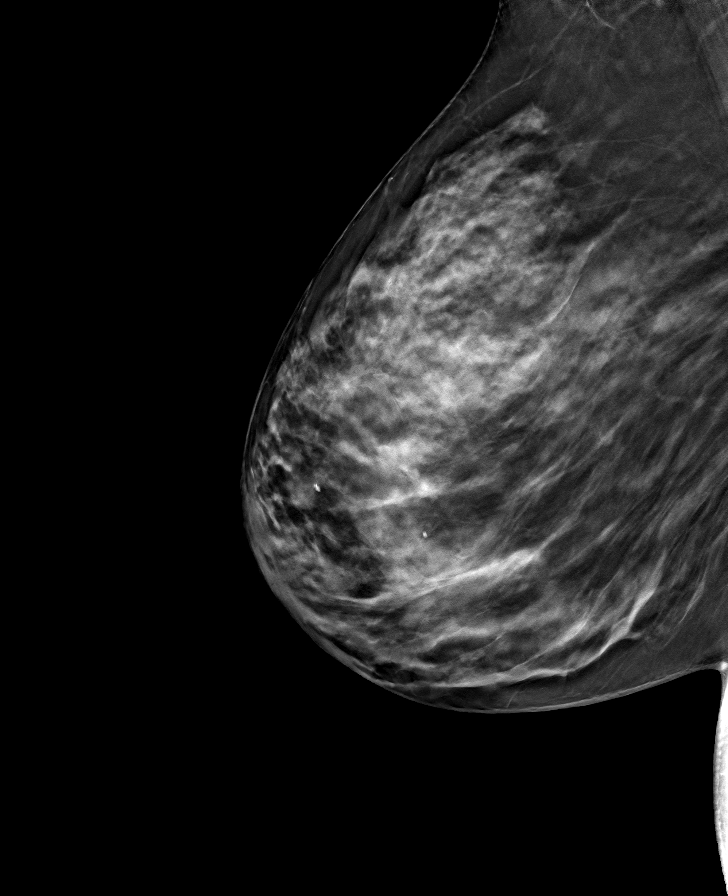

[R CC tomo (2 of 2) · tomo slice 36/71.0]
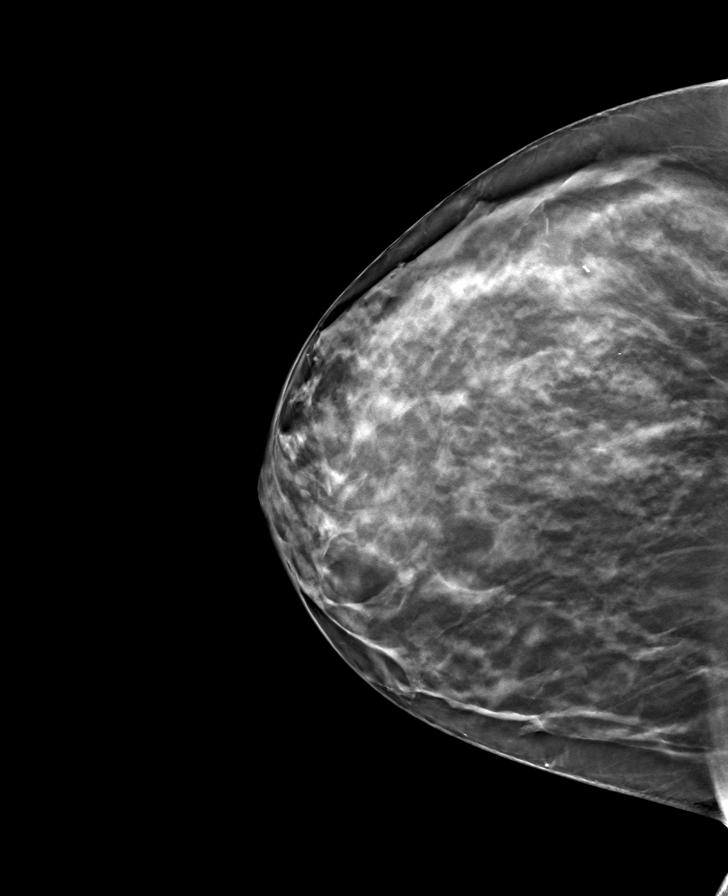

[8 of 24 positions shown; findings below may reference images not displayed]

FINDINGS: Mammographic images were obtained following MR guided biopsy of a
1.4 cm area of linear non mass enhancement in the anterior aspect of
the lower outer quadrant of the right breast. No biopsy marker clip
is seen in the biopsy cavity. Therefore, the patient had a coil
shaped clip placed under ultrasound guidance. Additional post clip
placement right mammogram images demonstrate the coil shaped clip in
the anterior aspect of the upper-outer quadrant of the breast. It is
difficult to determine if this matches the location of the MR guided
core needle biopsy due to differences in positioning between
mammography and MRI with the breasts in compression. This does
correlate to the site of biopsy based on biopsy needle entry site
when the patient returned for clip placement.
IMPRESSION: Appropriate positioning of the coil shaped shaped biopsy marking
clip at the site of biopsy in the anterior aspect of the lower outer
quadrant of the right breast.

Final Assessment: Post Procedure Mammograms for Marker Placement

## 2020-04-02 IMAGING — MG MM BREAST LOCALIZATION CLIP
4 series · 4 of 12 positions shown · non-contrast
Comparison: Previous exam(s).
COMPARISON: Previous exam(s).

Addendum:
CLINICAL DATA: Status post MR guided core of needle biopsy a 1.3 cm
spiculated mass in the retroareolar left breast.

EXAM:
DIAGNOSTIC LEFT MAMMOGRAM POST MRI BIOPSY

[L CC synth-2D]
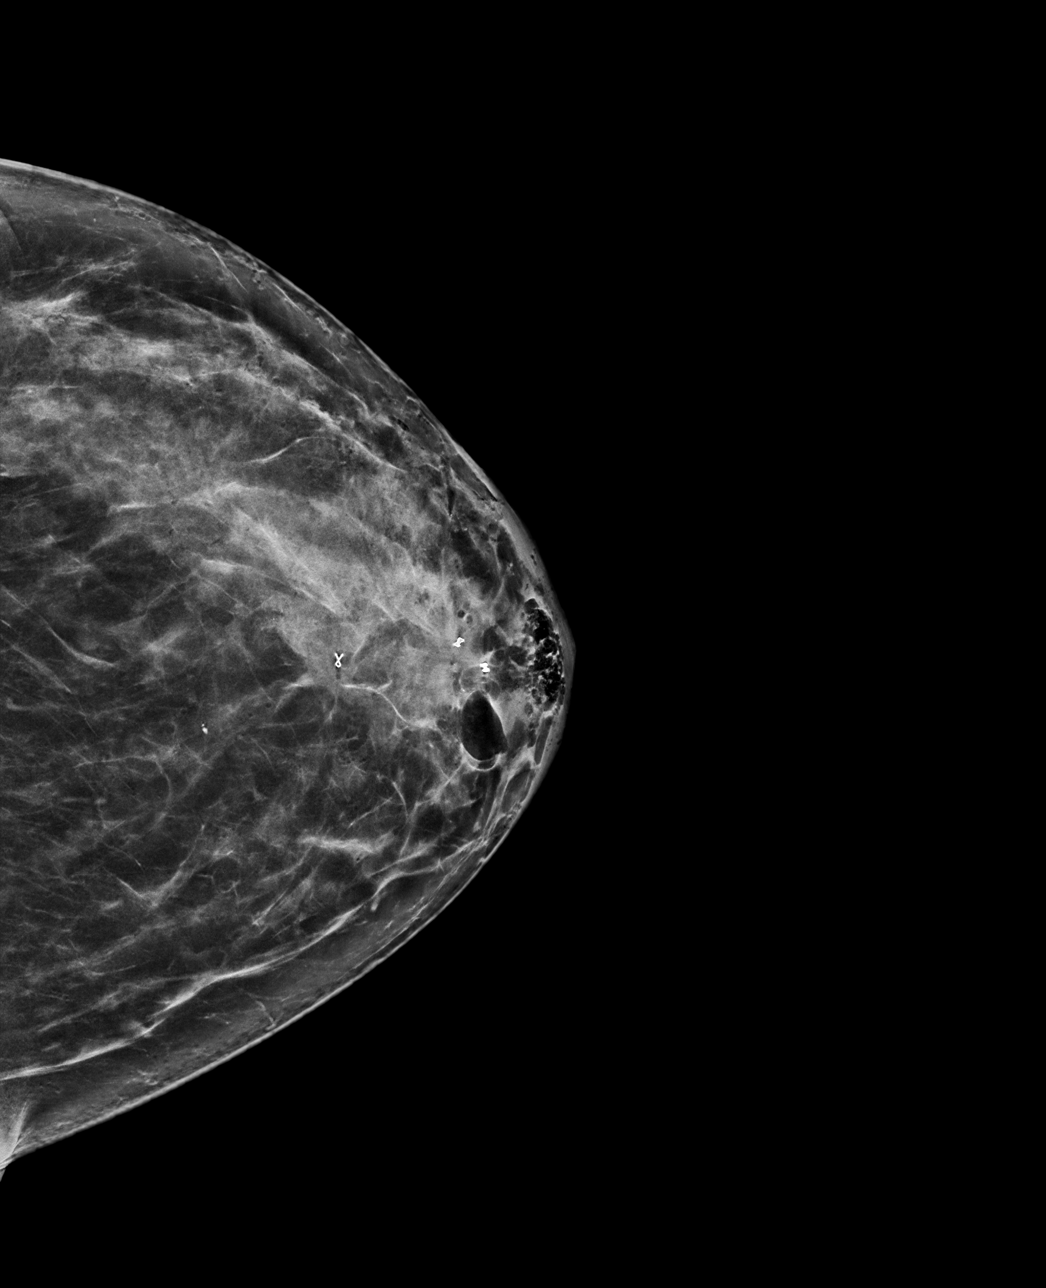

[L ML synth-2D]
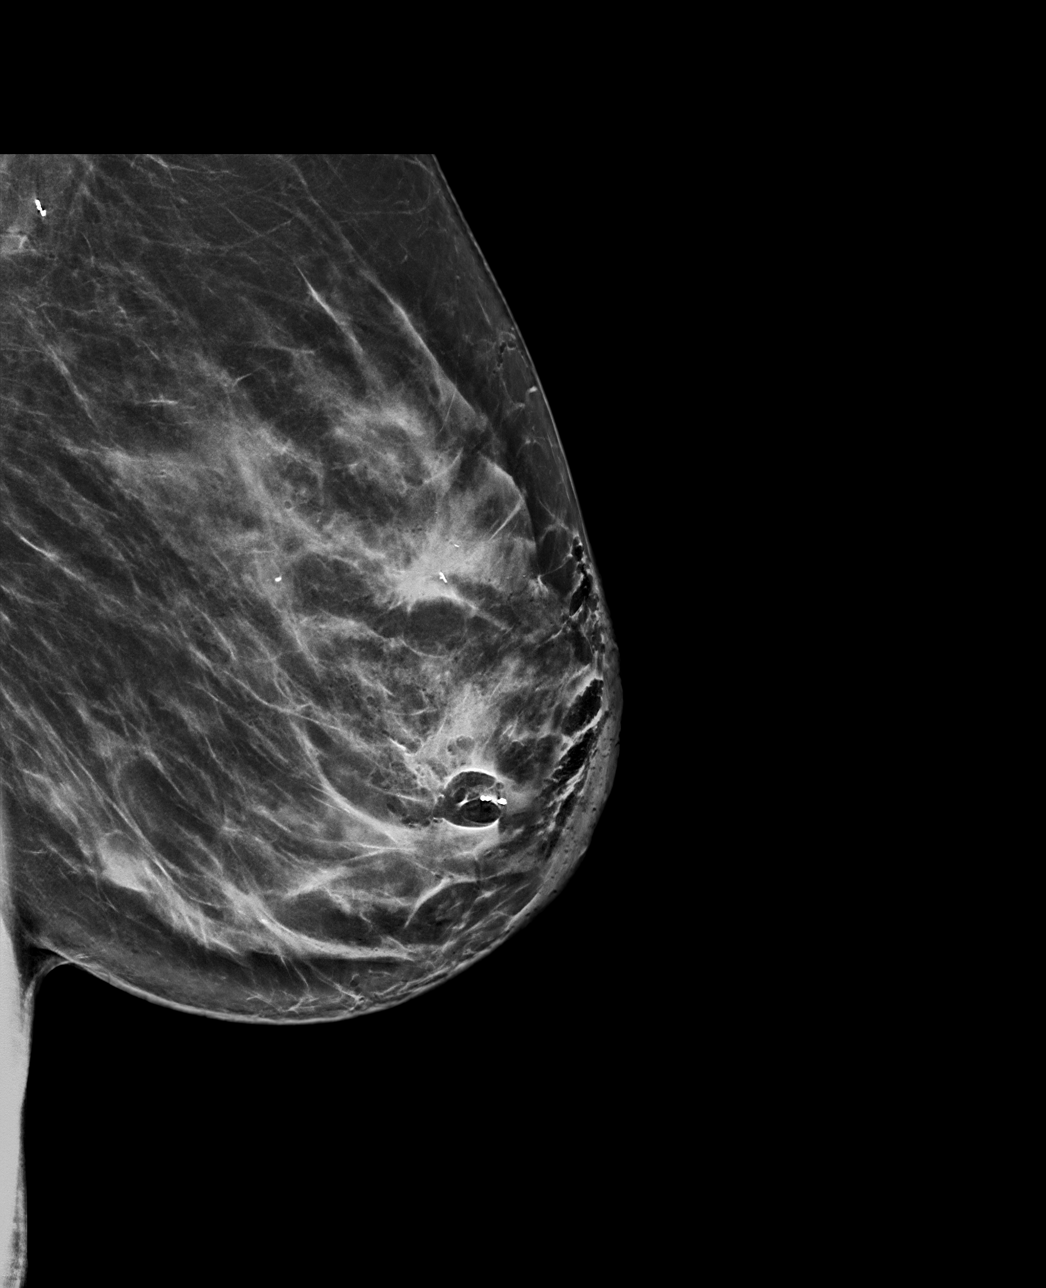

[L ML tomo · tomo slice 45/88.0]
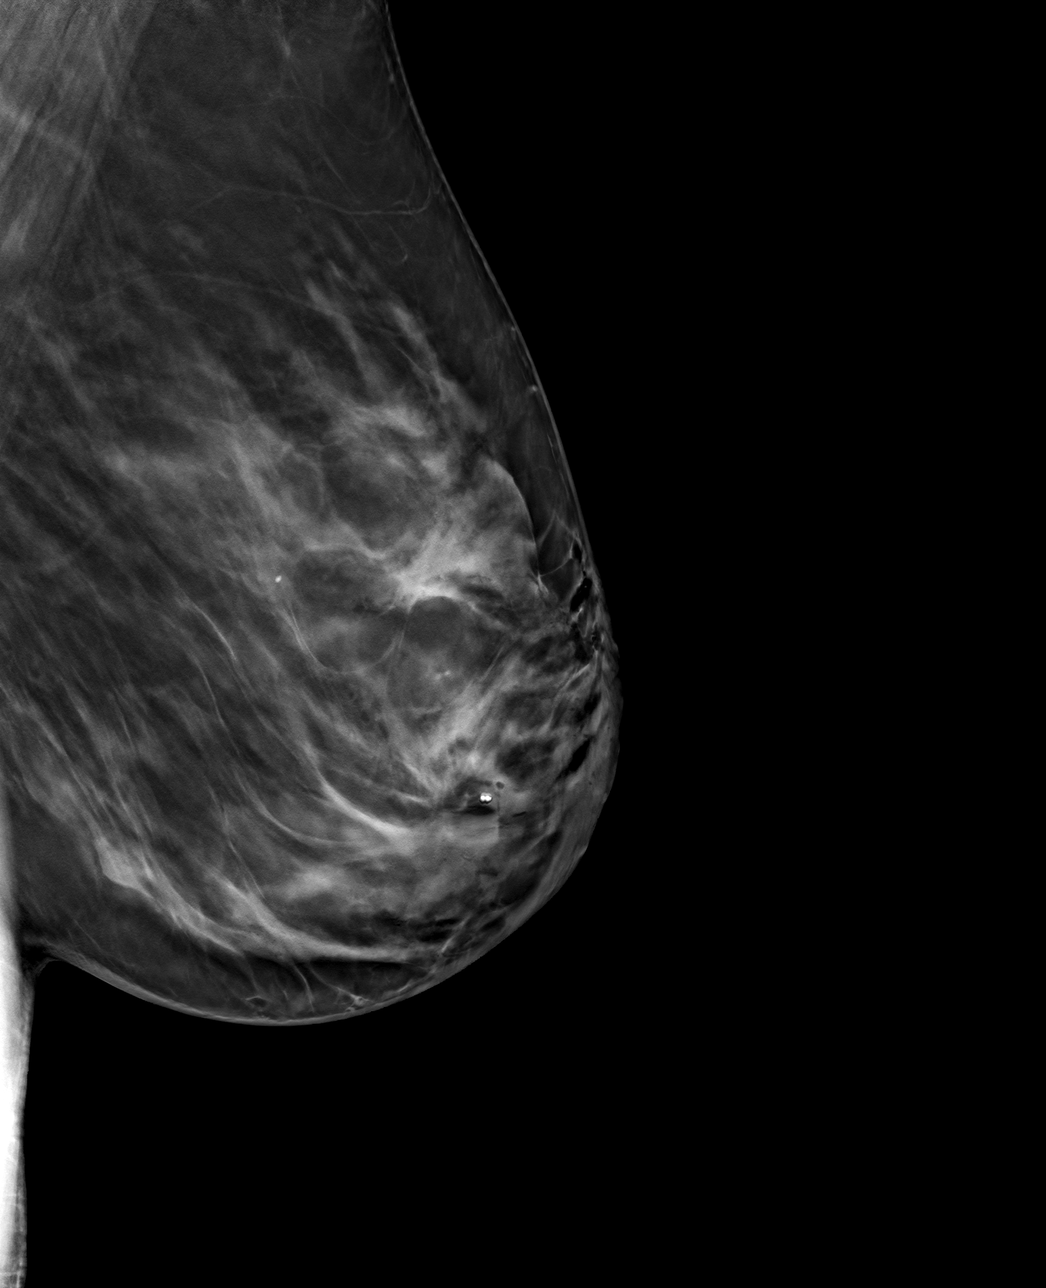

[L CC tomo · tomo slice 41/81.0]
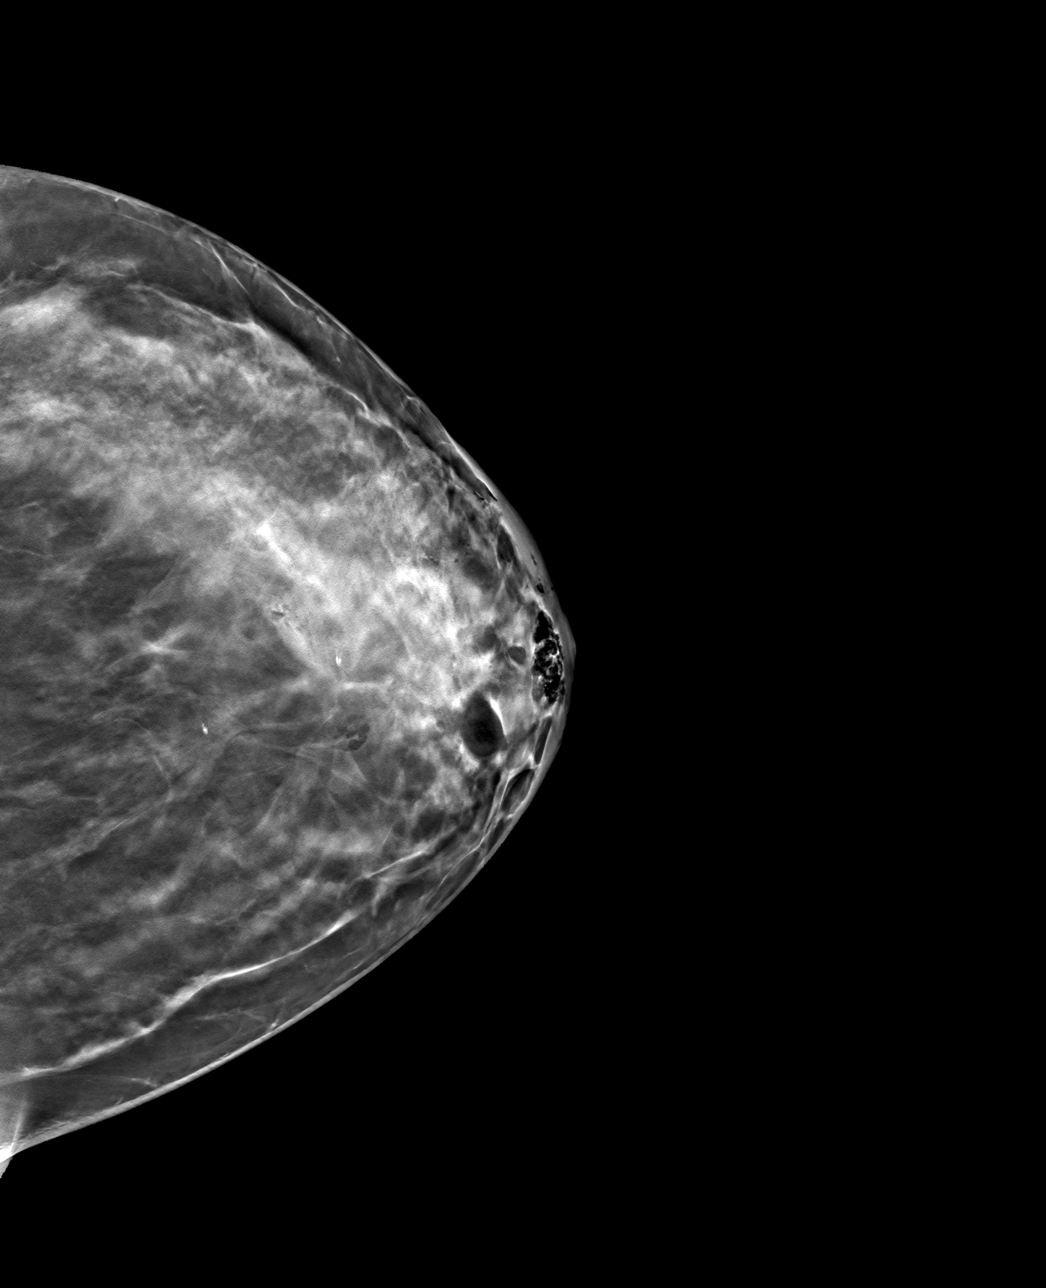

[4 of 12 positions shown; findings below may reference images not displayed]

FINDINGS: Mammographic images were obtained following MR guided biopsy of the
recently demonstrated 1.3 cm retroareolar left breast mass. There
are 2 adjacent dumbbell-shaped biopsy marker clips within the biopsy
cavity in the retroareolar left breast.
IMPRESSION: Appropriate positioning of 2 adjacent dumbbell-shaped biopsy marker
clips within the biopsy cavity in the retroareolar left breast.

Final Assessment: Post Procedure Mammograms for Marker Placement

ADDENDUM:
The dumbbell-shaped biopsy marker clips are located 4.5 cm from the
ribbon shaped clip placed at the location of previously biopsied
malignancy.

*** End of Addendum ***
FINDINGS: Mammographic images were obtained following MR guided biopsy of the
recently demonstrated 1.3 cm retroareolar left breast mass. There
are 2 adjacent dumbbell-shaped biopsy marker clips within the biopsy
cavity in the retroareolar left breast.
IMPRESSION: Appropriate positioning of 2 adjacent dumbbell-shaped biopsy marker
clips within the biopsy cavity in the retroareolar left breast.

Final Assessment: Post Procedure Mammograms for Marker Placement

## 2020-04-04 ENCOUNTER — Telehealth: Payer: Self-pay | Admitting: Hematology and Oncology

## 2020-04-04 NOTE — Telephone Encounter (Signed)
Scheduled per 10/22 los. Pt will receive an updated appt calendar at next visit per appt notes

## 2020-04-20 ENCOUNTER — Other Ambulatory Visit: Payer: BC Managed Care – PPO

## 2020-04-20 ENCOUNTER — Inpatient Hospital Stay: Payer: BC Managed Care – PPO | Attending: Hematology and Oncology

## 2020-04-20 ENCOUNTER — Other Ambulatory Visit: Payer: Self-pay

## 2020-04-20 ENCOUNTER — Ambulatory Visit: Payer: BC Managed Care – PPO | Admitting: Hematology and Oncology

## 2020-04-20 ENCOUNTER — Ambulatory Visit: Payer: BC Managed Care – PPO

## 2020-04-20 VITALS — BP 153/87 | HR 84 | Temp 98.3°F | Resp 18 | Ht 69.0 in | Wt 196.5 lb

## 2020-04-20 DIAGNOSIS — Z79899 Other long term (current) drug therapy: Secondary | ICD-10-CM | POA: Insufficient documentation

## 2020-04-20 DIAGNOSIS — Z923 Personal history of irradiation: Secondary | ICD-10-CM | POA: Insufficient documentation

## 2020-04-20 DIAGNOSIS — C50212 Malignant neoplasm of upper-inner quadrant of left female breast: Secondary | ICD-10-CM | POA: Diagnosis not present

## 2020-04-20 DIAGNOSIS — Z170 Estrogen receptor positive status [ER+]: Secondary | ICD-10-CM | POA: Diagnosis not present

## 2020-04-20 DIAGNOSIS — Z5112 Encounter for antineoplastic immunotherapy: Secondary | ICD-10-CM | POA: Insufficient documentation

## 2020-04-20 DIAGNOSIS — R7989 Other specified abnormal findings of blood chemistry: Secondary | ICD-10-CM | POA: Diagnosis not present

## 2020-04-20 MED ORDER — ACETAMINOPHEN 325 MG PO TABS
650.0000 mg | ORAL_TABLET | Freq: Once | ORAL | Status: AC
  Administered 2020-04-20: 650 mg via ORAL

## 2020-04-20 MED ORDER — SODIUM CHLORIDE 0.9 % IV SOLN
Freq: Once | INTRAVENOUS | Status: AC
  Filled 2020-04-20: qty 250

## 2020-04-20 MED ORDER — DIPHENHYDRAMINE HCL 25 MG PO CAPS
ORAL_CAPSULE | ORAL | Status: AC
  Filled 2020-04-20: qty 2

## 2020-04-20 MED ORDER — HEPARIN SOD (PORK) LOCK FLUSH 100 UNIT/ML IV SOLN
500.0000 [IU] | Freq: Once | INTRAVENOUS | Status: AC | PRN
  Administered 2020-04-20: 500 [IU]
  Filled 2020-04-20: qty 5

## 2020-04-20 MED ORDER — TRASTUZUMAB-DKST CHEMO 150 MG IV SOLR
6.0000 mg/kg | Freq: Once | INTRAVENOUS | Status: AC
  Administered 2020-04-20: 546 mg via INTRAVENOUS
  Filled 2020-04-20: qty 26

## 2020-04-20 MED ORDER — SODIUM CHLORIDE 0.9% FLUSH
10.0000 mL | INTRAVENOUS | Status: DC | PRN
  Administered 2020-04-20: 10 mL
  Filled 2020-04-20: qty 10

## 2020-04-20 MED ORDER — DIPHENHYDRAMINE HCL 25 MG PO CAPS
50.0000 mg | ORAL_CAPSULE | Freq: Once | ORAL | Status: AC
  Administered 2020-04-20: 50 mg via ORAL

## 2020-04-20 MED ORDER — ACETAMINOPHEN 325 MG PO TABS
ORAL_TABLET | ORAL | Status: AC
  Filled 2020-04-20: qty 2

## 2020-04-20 NOTE — Patient Instructions (Signed)
[facility] Discharge Instructions for Patients Receiving Chemotherapy  Today you received the following chemotherapy agents Trastuzumab-dkst (OGIVRI).  To help prevent nausea and vomiting after your treatment, we encourage you to take your nausea medication as prescribed.   If you develop nausea and vomiting that is not controlled by your nausea medication, call the clinic.   BELOW ARE SYMPTOMS THAT SHOULD BE REPORTED IMMEDIATELY:  *FEVER GREATER THAN 100.5 F  *CHILLS WITH OR WITHOUT FEVER  NAUSEA AND VOMITING THAT IS NOT CONTROLLED WITH YOUR NAUSEA MEDICATION  *UNUSUAL SHORTNESS OF BREATH  *UNUSUAL BRUISING OR BLEEDING  TENDERNESS IN MOUTH AND THROAT WITH OR WITHOUT PRESENCE OF ULCERS  *URINARY PROBLEMS  *BOWEL PROBLEMS  UNUSUAL RASH Items with * indicate a potential emergency and should be followed up as soon as possible.  Feel free to call the clinic should you have any questions or concerns. The clinic phone number is [phone].  Please show the CHEMO ALERT CARD at check-in to the Emergency Department and triage nurse.   

## 2020-05-10 ENCOUNTER — Other Ambulatory Visit: Payer: Self-pay | Admitting: *Deleted

## 2020-05-10 DIAGNOSIS — C50212 Malignant neoplasm of upper-inner quadrant of left female breast: Secondary | ICD-10-CM

## 2020-05-10 DIAGNOSIS — Z170 Estrogen receptor positive status [ER+]: Secondary | ICD-10-CM

## 2020-05-10 NOTE — Assessment & Plan Note (Signed)
04/28/2019: Palpable left breast lump, mammogram 4.5 cm mass at 11:30 position and adjacent smaller masses, 1 abnormal left axillary lymph node: Biopsy of breast and axilla both positive for grade 3 IDC ER 95%, PR 90%, Ki-67 70%, HER-2 negative T2N1 stage IIb clinical stage  Recommendation: 1.Neoadjuvant chemotherapywith Taxotere and Cytoxan every 3 weeks x4completed 07/30/2019,Adjuvant Herceptinstarted 09/20/2019 2.Breast conserving surgery with targeted node dissection3/23/2021 3.Adjuvant radiation therapy 10/07/2019-12/15/2019 4.Followed by adjuvant antiestrogen therapy Starting December 16, 2019 --------------------------------------------------------------------------------------------------------------------------------------------------------- Post neoadjuvant breast MRI 08/05/2019: Left breast cancer previously 3.8 cm is now 2.5 cm now ill-defined and less confluent. Retroareolar mass 1.3 cm is now minimal enhancement. Left axilla lymph node abnormal  Left lumpectomy 08/30/2019: Left upper outer quadrant: Grade 3 IDC 1.5 cm with high-grade DCIS, margins negative Left lateral lumpectomy: Grade 3 IDC microscopic focus margins negative, 1/3 lymph nodes positive Repeat prognostic panel: ER 50% moderate, PR 10% strong, Ki-67 2%, HER-2 equivocal by IHC FISH positive for HER-2 ratio 3.5 and copy #5.55. (Heterogeneity in HER-2 expression)  Current treatment:Adjuvant Herceptin every [redacted] weeks along with anastrozole Herceptin toxicities: None, Herceptin to be completed March 2022 Echocardiogram 02/06/2020: EF 55 to 60%  Elevated LFTs: Slowly coming down.  Okay to treat.  Anastrozoletoxicities:Denies any hot flashes or myalgias.  Return to clinic every 3 weeks for Herceptin every 6 weeks to follow-up with me.

## 2020-05-10 NOTE — Progress Notes (Signed)
Patient Care Team: Rochel Brome, MD as PCP - General (Family Medicine) Rockwell Germany, RN as Oncology Nurse Navigator Mauro Kaufmann, RN as Oncology Nurse Navigator  DIAGNOSIS:    ICD-10-CM   1. Malignant neoplasm of upper-inner quadrant of left breast in female, estrogen receptor positive ([place])  C50.212    Z17.0     SUMMARY OF ONCOLOGIC HISTORY: Oncology History  Malignant neoplasm of upper-inner quadrant of left breast in female, estrogen receptor positive ([place])  04/28/2019 Initial Diagnosis   Palpable left breast lump.  Mammogram 04/21/19 showed a 4.5cm mass at the 11:30 position with smaller adjacent solid masses and one abnormal left axillary lymph node. 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   05/10/2019 Cancer Staging   Staging form: Breast, AJCC 8th Edition - Clinical stage from 05/10/2019: Stage IIB (cT2, cN1, cM0, G3, ER+, PR+, HER2-) - Signed by Nicholas Lose, MD on 05/10/2019   05/19/2019 - 08/01/2019 Chemotherapy   The patient had dexamethasone (DECADRON) 4 MG tablet, 4 mg (100 % of original dose 4 mg), Oral, Daily, 1 of 1 cycle, Start date: 06/20/2019, End date: 08/22/2019 Dose modification: 4 mg (original dose 4 mg, Cycle 0) palonosetron (ALOXI) injection 0.25 mg, 0.25 mg, Intravenous,  Once, 4 of 4 cycles Administration: 0.25 mg (05/19/2019), 0.25 mg (06/09/2019), 0.25 mg (07/08/2019), 0.25 mg (07/30/2019) pegfilgrastim-jmdb (FULPHILA) injection 6 mg, 6 mg, Subcutaneous,  Once, 4 of 4 cycles Administration: 6 mg (05/20/2019), 6 mg (06/11/2019), 6 mg (07/11/2019), 6 mg (08/01/2019) cyclophosphamide (CYTOXAN) 1,280 mg in sodium chloride 0.9 % 250 mL chemo infusion, 600 mg/m2 = 1,280 mg, Intravenous,  Once, 4 of 4 cycles Dose modification: 400 mg/m2 (original dose 600 mg/m2, Cycle 3, Reason: Dose not tolerated) Administration: 1,280 mg (05/19/2019), 1,280 mg (06/09/2019), 840 mg  (07/08/2019), 840 mg (07/30/2019) DOCEtaxel (TAXOTERE) 160 mg in sodium chloride 0.9 % 250 mL chemo infusion, 75 mg/m2 = 160 mg, Intravenous,  Once, 4 of 4 cycles Dose modification: 50 mg/m2 (original dose 75 mg/m2, Cycle 3, Reason: Dose not tolerated) Administration: 160 mg (05/19/2019), 160 mg (06/09/2019), 110 mg (07/08/2019), 110 mg (07/30/2019) fosaprepitant (EMEND) 150 mg in sodium chloride 0.9 % 145 mL IVPB, , Intravenous,  Once, 4 of 4 cycles Administration:  (05/19/2019),  (06/09/2019),  (07/08/2019),  (07/30/2019)  for chemotherapy treatment.    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    09/20/2019 -  Chemotherapy   The patient had trastuzumab-dkst (OGIVRI) 714 mg in sodium chloride 0.9 % 250 mL chemo infusion, 8 mg/kg = 714 mg, Intravenous,  Once, 11 of 17 cycles Administration: 714 mg (09/20/2019), 546 mg (10/11/2019), 546 mg (12/16/2019), 546 mg (01/05/2020), 546 mg (01/27/2020), 546 mg (02/17/2020), 546 mg (03/09/2020), 546 mg (03/30/2020), 546 mg (04/20/2020), 546 mg (11/01/2019), 546 mg (11/22/2019)  for chemotherapy treatment.    10/07/2019 - 11/22/2019 Radiation Therapy   Adjuvant radiation   12/16/2019 -  Anti-estrogen oral therapy   Anastrozole, planned duration 7 years     CHIEF COMPLIANT: Herceptin maintenance  INTERVAL HISTORY: Elizabeth Sharp is a 54 y.o. with above-mentioned history of left breast cancertreated withneoadjuvant chemotherapy,left lumpectomy, radiation therapy,and is currently onadjuvant Herceptinand antiestrogen therapy with anastrozole.She presentsto the clinic todayfor treatment.   ALLERGIES:  has No Known Allergies.  MEDICATIONS:  Current Outpatient  Medications  Medication Sig Dispense Refill  . anastrozole (ARIMIDEX) 1 MG tablet Take 1 tablet (1 mg total) by mouth daily. (Patient not taking: Reported on 12/29/2019) 90 tablet 3  .  lidocaine-prilocaine (EMLA) cream Apply 1 application topically every 21 ( twenty-one) days. 30 g 1   No current facility-administered medications for this visit.    PHYSICAL EXAMINATION: ECOG PERFORMANCE STATUS: 1 - Symptomatic but completely ambulatory  Vitals:   05/11/20 1105  BP: (!) 169/89  Pulse: 88  Resp: 18  Temp: 98.5 F (36.9 C)  SpO2: 100%   Filed Weights   05/11/20 1105  Weight: 196 lb 8 oz (89.1 kg)    LABORATORY DATA:  I have reviewed the data as listed CMP Latest Ref Rng & Units 05/11/2020 03/30/2020 01/27/2020  Glucose 70 - 99 mg/dL 110(H) 101(H) 109(H)  BUN 6 - 20 mg/dL '12 11 11  ' Creatinine 0.44 - 1.00 mg/dL 0.69 0.72 0.67  Sodium 135 - 145 mmol/L 137 138 140  Potassium 3.5 - 5.1 mmol/L 4.4 3.8 4.2  Chloride 98 - 111 mmol/L 103 103 103  CO2 22 - 32 mmol/L '25 24 25  ' Calcium 8.9 - 10.3 mg/dL 9.6 9.3 9.7  Total Protein 6.5 - 8.1 g/dL 8.0 7.1 7.4  Total Bilirubin 0.3 - 1.2 mg/dL 0.8 0.6 0.6  Alkaline Phos 38 - 126 U/L 101 101 101  AST 15 - 41 U/L 107(H) 79(H) 146(H)  ALT 0 - 44 U/L 117(H) 101(H) 161(H)    Lab Results  Component Value Date   WBC 4.0 05/11/2020   HGB 12.0 05/11/2020   HCT 37.2 05/11/2020   MCV 94.2 05/11/2020   PLT 297 05/11/2020   NEUTROABS 2.5 05/11/2020    ASSESSMENT & PLAN:  Malignant neoplasm of upper-inner quadrant of left breast in female, estrogen receptor positive ([place]) 04/28/2019: Palpable left breast lump, mammogram 4.5 cm mass at 11:30 position and adjacent smaller masses, 1 abnormal left axillary lymph node: Biopsy of breast and axilla both positive for grade 3 IDC ER 95%, PR 90%, Ki-67 70%, HER-2 negative T2N1 stage IIb clinical stage  Recommendation: 1.Neoadjuvant chemotherapywith Taxotere and Cytoxan every 3 weeks x4completed 07/30/2019,Adjuvant Herceptinstarted 09/20/2019 2.Breast conserving surgery with targeted node dissection3/23/2021 3.Adjuvant radiation therapy 10/07/2019-12/15/2019 4.Followed by  adjuvant antiestrogen therapy Starting December 16, 2019 --------------------------------------------------------------------------------------------------------------------------------------------------------- Post neoadjuvant breast MRI 08/05/2019: Left breast cancer previously 3.8 cm is now 2.5 cm now ill-defined and less confluent. Retroareolar mass 1.3 cm is now minimal enhancement. Left axilla lymph node abnormal  Left lumpectomy 08/30/2019: Left upper outer quadrant: Grade 3 IDC 1.5 cm with high-grade DCIS, margins negative Left lateral lumpectomy: Grade 3 IDC microscopic focus margins negative, 1/3 lymph nodes positive Repeat prognostic panel: ER 50% moderate, PR 10% strong, Ki-67 2%, HER-2 equivocal by IHC FISH positive for HER-2 ratio 3.5 and copy #5.55. (Heterogeneity in HER-2 expression)  Current treatment:Adjuvant Herceptin every [redacted] weeks along with anastrozole Herceptin toxicities: None, Herceptin to be completed March 2022 Echocardiogram 02/06/2020: EF 55 to 60%  Elevated LFTs: Slowly coming down.  Okay to treat.  Anastrozoletoxicities:Denies any hot flashes or myalgias.  Return to clinic every 3 weeks for Herceptin every 6 weeks to follow-up with me.     No orders of the defined types were placed in this encounter.  The patient has a good understanding of the overall plan. she agrees with it. she will call with any problems that may develop before the next visit here.  Total time spent: 30 mins including face  to face time and time spent for planning, charting and coordination of care  Nicholas Lose, MD 05/11/2020  I, Cloyde Reams Dorshimer, am acting as scribe for Dr. Nicholas Lose.  I have reviewed the above documentation for accuracy and completeness, and I agree with the above.

## 2020-05-11 ENCOUNTER — Inpatient Hospital Stay: Payer: BC Managed Care – PPO | Attending: Hematology and Oncology

## 2020-05-11 ENCOUNTER — Inpatient Hospital Stay: Payer: BC Managed Care – PPO

## 2020-05-11 ENCOUNTER — Inpatient Hospital Stay: Payer: BC Managed Care – PPO | Admitting: Hematology and Oncology

## 2020-05-11 ENCOUNTER — Other Ambulatory Visit: Payer: Self-pay

## 2020-05-11 DIAGNOSIS — R7989 Other specified abnormal findings of blood chemistry: Secondary | ICD-10-CM | POA: Insufficient documentation

## 2020-05-11 DIAGNOSIS — C50212 Malignant neoplasm of upper-inner quadrant of left female breast: Secondary | ICD-10-CM

## 2020-05-11 DIAGNOSIS — C773 Secondary and unspecified malignant neoplasm of axilla and upper limb lymph nodes: Secondary | ICD-10-CM | POA: Diagnosis not present

## 2020-05-11 DIAGNOSIS — Z923 Personal history of irradiation: Secondary | ICD-10-CM | POA: Insufficient documentation

## 2020-05-11 DIAGNOSIS — Z5112 Encounter for antineoplastic immunotherapy: Secondary | ICD-10-CM | POA: Diagnosis not present

## 2020-05-11 DIAGNOSIS — Z170 Estrogen receptor positive status [ER+]: Secondary | ICD-10-CM | POA: Insufficient documentation

## 2020-05-11 DIAGNOSIS — Z79899 Other long term (current) drug therapy: Secondary | ICD-10-CM | POA: Insufficient documentation

## 2020-05-11 LAB — CBC WITH DIFFERENTIAL (CANCER CENTER ONLY)
Abs Immature Granulocytes: 0.01 10*3/uL (ref 0.00–0.07)
Basophils Absolute: 0.1 10*3/uL (ref 0.0–0.1)
Basophils Relative: 2 %
Eosinophils Absolute: 0.2 10*3/uL (ref 0.0–0.5)
Eosinophils Relative: 4 %
HCT: 37.2 % (ref 36.0–46.0)
Hemoglobin: 12 g/dL (ref 12.0–15.0)
Immature Granulocytes: 0 %
Lymphocytes Relative: 23 %
Lymphs Abs: 0.9 10*3/uL (ref 0.7–4.0)
MCH: 30.4 pg (ref 26.0–34.0)
MCHC: 32.3 g/dL (ref 30.0–36.0)
MCV: 94.2 fL (ref 80.0–100.0)
Monocytes Absolute: 0.4 10*3/uL (ref 0.1–1.0)
Monocytes Relative: 9 %
Neutro Abs: 2.5 10*3/uL (ref 1.7–7.7)
Neutrophils Relative %: 62 %
Platelet Count: 297 10*3/uL (ref 150–400)
RBC: 3.95 MIL/uL (ref 3.87–5.11)
RDW: 14.9 % (ref 11.5–15.5)
WBC Count: 4 10*3/uL (ref 4.0–10.5)
nRBC: 0 % (ref 0.0–0.2)

## 2020-05-11 LAB — CMP (CANCER CENTER ONLY)
ALT: 117 U/L — ABNORMAL HIGH (ref 0–44)
AST: 107 U/L — ABNORMAL HIGH (ref 15–41)
Albumin: 3.8 g/dL (ref 3.5–5.0)
Alkaline Phosphatase: 101 U/L (ref 38–126)
Anion gap: 9 (ref 5–15)
BUN: 12 mg/dL (ref 6–20)
CO2: 25 mmol/L (ref 22–32)
Calcium: 9.6 mg/dL (ref 8.9–10.3)
Chloride: 103 mmol/L (ref 98–111)
Creatinine: 0.69 mg/dL (ref 0.44–1.00)
GFR, Estimated: >60 mL/min (ref >60)
Glucose, Bld: 110 mg/dL — ABNORMAL HIGH (ref 70–99)
Potassium: 4.4 mmol/L (ref 3.5–5.1)
Sodium: 137 mmol/L (ref 135–145)
Total Bilirubin: 0.8 mg/dL (ref 0.3–1.2)
Total Protein: 8 g/dL (ref 6.5–8.1)

## 2020-05-11 MED ORDER — HEPARIN SOD (PORK) LOCK FLUSH 100 UNIT/ML IV SOLN
500.0000 [IU] | Freq: Once | INTRAVENOUS | Status: AC | PRN
  Administered 2020-05-11: 500 [IU]
  Filled 2020-05-11: qty 5

## 2020-05-11 MED ORDER — TRASTUZUMAB-DKST CHEMO 150 MG IV SOLR
6.0000 mg/kg | Freq: Once | INTRAVENOUS | Status: AC
  Administered 2020-05-11: 546 mg via INTRAVENOUS
  Filled 2020-05-11: qty 26

## 2020-05-11 MED ORDER — DIPHENHYDRAMINE HCL 25 MG PO CAPS
ORAL_CAPSULE | ORAL | Status: AC
  Filled 2020-05-11: qty 2

## 2020-05-11 MED ORDER — DIPHENHYDRAMINE HCL 25 MG PO CAPS
50.0000 mg | ORAL_CAPSULE | Freq: Once | ORAL | Status: AC
  Administered 2020-05-11: 50 mg via ORAL

## 2020-05-11 MED ORDER — SODIUM CHLORIDE 0.9 % IV SOLN
Freq: Once | INTRAVENOUS | Status: AC
  Filled 2020-05-11: qty 250

## 2020-05-11 MED ORDER — ACETAMINOPHEN 325 MG PO TABS
ORAL_TABLET | ORAL | Status: AC
  Filled 2020-05-11: qty 2

## 2020-05-11 MED ORDER — ACETAMINOPHEN 325 MG PO TABS
650.0000 mg | ORAL_TABLET | Freq: Once | ORAL | Status: AC
  Administered 2020-05-11: 650 mg via ORAL

## 2020-05-11 MED ORDER — SODIUM CHLORIDE 0.9% FLUSH
10.0000 mL | INTRAVENOUS | Status: DC | PRN
  Administered 2020-05-11: 10 mL
  Filled 2020-05-11: qty 10

## 2020-05-11 NOTE — Patient Instructions (Signed)
[facility] Discharge Instructions for Patients Receiving Chemotherapy  Today you received the following chemotherapy agents Trastuzumab-dkst (OGIVRI).  To help prevent nausea and vomiting after your treatment, we encourage you to take your nausea medication as prescribed.   If you develop nausea and vomiting that is not controlled by your nausea medication, call the clinic.   BELOW ARE SYMPTOMS THAT SHOULD BE REPORTED IMMEDIATELY:  *FEVER GREATER THAN 100.5 F  *CHILLS WITH OR WITHOUT FEVER  NAUSEA AND VOMITING THAT IS NOT CONTROLLED WITH YOUR NAUSEA MEDICATION  *UNUSUAL SHORTNESS OF BREATH  *UNUSUAL BRUISING OR BLEEDING  TENDERNESS IN MOUTH AND THROAT WITH OR WITHOUT PRESENCE OF ULCERS  *URINARY PROBLEMS  *BOWEL PROBLEMS  UNUSUAL RASH Items with * indicate a potential emergency and should be followed up as soon as possible.  Feel free to call the clinic should you have any questions or concerns. The clinic phone number is [phone].  Please show the CHEMO ALERT CARD at check-in to the Emergency Department and triage nurse.   

## 2020-05-11 NOTE — Progress Notes (Signed)
Patient stable upon leaving infusion room.  

## 2020-05-16 ENCOUNTER — Telehealth: Payer: Self-pay | Admitting: Hematology and Oncology

## 2020-05-16 NOTE — Telephone Encounter (Signed)
No 12/3 los, no changes made to pt schedule

## 2020-05-31 ENCOUNTER — Other Ambulatory Visit: Payer: Self-pay

## 2020-05-31 ENCOUNTER — Inpatient Hospital Stay: Payer: BC Managed Care – PPO

## 2020-05-31 VITALS — BP 151/84 | HR 79 | Temp 98.2°F | Resp 18 | Ht 69.0 in | Wt 194.2 lb

## 2020-05-31 DIAGNOSIS — Z5112 Encounter for antineoplastic immunotherapy: Secondary | ICD-10-CM | POA: Diagnosis not present

## 2020-05-31 DIAGNOSIS — R7989 Other specified abnormal findings of blood chemistry: Secondary | ICD-10-CM | POA: Diagnosis not present

## 2020-05-31 DIAGNOSIS — Z170 Estrogen receptor positive status [ER+]: Secondary | ICD-10-CM | POA: Diagnosis not present

## 2020-05-31 DIAGNOSIS — Z923 Personal history of irradiation: Secondary | ICD-10-CM | POA: Diagnosis not present

## 2020-05-31 DIAGNOSIS — Z79899 Other long term (current) drug therapy: Secondary | ICD-10-CM | POA: Diagnosis not present

## 2020-05-31 DIAGNOSIS — C773 Secondary and unspecified malignant neoplasm of axilla and upper limb lymph nodes: Secondary | ICD-10-CM | POA: Diagnosis not present

## 2020-05-31 DIAGNOSIS — C50212 Malignant neoplasm of upper-inner quadrant of left female breast: Secondary | ICD-10-CM | POA: Diagnosis not present

## 2020-05-31 MED ORDER — SODIUM CHLORIDE 0.9% FLUSH
10.0000 mL | INTRAVENOUS | Status: DC | PRN
  Administered 2020-05-31: 13:00:00 10 mL
  Filled 2020-05-31: qty 10

## 2020-05-31 MED ORDER — SODIUM CHLORIDE 0.9 % IV SOLN
Freq: Once | INTRAVENOUS | Status: AC
  Filled 2020-05-31: qty 250

## 2020-05-31 MED ORDER — ACETAMINOPHEN 325 MG PO TABS
ORAL_TABLET | ORAL | Status: AC
  Filled 2020-05-31: qty 2

## 2020-05-31 MED ORDER — DIPHENHYDRAMINE HCL 25 MG PO CAPS
ORAL_CAPSULE | ORAL | Status: AC
  Filled 2020-05-31: qty 2

## 2020-05-31 MED ORDER — TRASTUZUMAB-DKST CHEMO 150 MG IV SOLR
6.0000 mg/kg | Freq: Once | INTRAVENOUS | Status: AC
  Administered 2020-05-31: 12:00:00 546 mg via INTRAVENOUS
  Filled 2020-05-31: qty 26

## 2020-05-31 MED ORDER — DIPHENHYDRAMINE HCL 25 MG PO CAPS
50.0000 mg | ORAL_CAPSULE | Freq: Once | ORAL | Status: AC
  Administered 2020-05-31: 12:00:00 50 mg via ORAL

## 2020-05-31 MED ORDER — HEPARIN SOD (PORK) LOCK FLUSH 100 UNIT/ML IV SOLN
500.0000 [IU] | Freq: Once | INTRAVENOUS | Status: AC | PRN
  Administered 2020-05-31: 13:00:00 500 [IU]
  Filled 2020-05-31: qty 5

## 2020-05-31 MED ORDER — ACETAMINOPHEN 325 MG PO TABS
650.0000 mg | ORAL_TABLET | Freq: Once | ORAL | Status: AC
  Administered 2020-05-31: 12:00:00 650 mg via ORAL

## 2020-05-31 NOTE — Progress Notes (Signed)
Patient discharged from [facility] stable and ambulating

## 2020-05-31 NOTE — Patient Instructions (Signed)
[facility] Discharge Instructions for Patients Receiving Chemotherapy  Today you received the following chemotherapy agents trastuzumab.  To help prevent nausea and vomiting after your treatment, we encourage you to take your nausea medication as directed.    If you develop nausea and vomiting that is not controlled by your nausea medication, call the clinic.   BELOW ARE SYMPTOMS THAT SHOULD BE REPORTED IMMEDIATELY:  *FEVER GREATER THAN 100.5 F  *CHILLS WITH OR WITHOUT FEVER  NAUSEA AND VOMITING THAT IS NOT CONTROLLED WITH YOUR NAUSEA MEDICATION  *UNUSUAL SHORTNESS OF BREATH  *UNUSUAL BRUISING OR BLEEDING  TENDERNESS IN MOUTH AND THROAT WITH OR WITHOUT PRESENCE OF ULCERS  *URINARY PROBLEMS  *BOWEL PROBLEMS  UNUSUAL RASH Items with * indicate a potential emergency and should be followed up as soon as possible.  Feel free to call the clinic should you have any questions or concerns. The clinic phone number is [phone].  Please show the CHEMO ALERT CARD at check-in to the Emergency Department and triage nurse.   

## 2020-06-19 ENCOUNTER — Other Ambulatory Visit: Payer: Self-pay | Admitting: *Deleted

## 2020-06-19 DIAGNOSIS — Z170 Estrogen receptor positive status [ER+]: Secondary | ICD-10-CM

## 2020-06-19 DIAGNOSIS — C50212 Malignant neoplasm of upper-inner quadrant of left female breast: Secondary | ICD-10-CM

## 2020-06-20 NOTE — Progress Notes (Signed)
Patient Care Team: Rochel Brome, MD as PCP - General (Family Medicine) Rockwell Germany, RN as Oncology Nurse Navigator Mauro Kaufmann, RN as Oncology Nurse Navigator  DIAGNOSIS:    ICD-10-CM   1. Malignant neoplasm of upper-inner quadrant of left breast in female, estrogen receptor positive ([place])  C50.212    Z17.0     SUMMARY OF ONCOLOGIC HISTORY: Oncology History  Malignant neoplasm of upper-inner quadrant of left breast in female, estrogen receptor positive ([place])  04/28/2019 Initial Diagnosis   Palpable left breast lump.  Mammogram 04/21/19 showed a 4.5cm mass at the 11:30 position with smaller adjacent solid masses and one abnormal left axillary lymph node. 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   05/10/2019 Cancer Staging   Staging form: Breast, AJCC 8th Edition - Clinical stage from 05/10/2019: Stage IIB (cT2, cN1, cM0, G3, ER+, PR+, HER2-) - Signed by Nicholas Lose, MD on 05/10/2019   05/19/2019 - 08/01/2019 Chemotherapy   The patient had dexamethasone (DECADRON) 4 MG tablet, 4 mg (100 % of original dose 4 mg), Oral, Daily, 1 of 1 cycle, Start date: 06/20/2019, End date: 08/22/2019 Dose modification: 4 mg (original dose 4 mg, Cycle 0) palonosetron (ALOXI) injection 0.25 mg, 0.25 mg, Intravenous,  Once, 4 of 4 cycles Administration: 0.25 mg (05/19/2019), 0.25 mg (06/09/2019), 0.25 mg (07/08/2019), 0.25 mg (07/30/2019) pegfilgrastim-jmdb (FULPHILA) injection 6 mg, 6 mg, Subcutaneous,  Once, 4 of 4 cycles Administration: 6 mg (05/20/2019), 6 mg (06/11/2019), 6 mg (07/11/2019), 6 mg (08/01/2019) cyclophosphamide (CYTOXAN) 1,280 mg in sodium chloride 0.9 % 250 mL chemo infusion, 600 mg/m2 = 1,280 mg, Intravenous,  Once, 4 of 4 cycles Dose modification: 400 mg/m2 (original dose 600 mg/m2, Cycle 3, Reason: Dose not tolerated) Administration: 1,280 mg (05/19/2019), 1,280 mg (06/09/2019), 840 mg  (07/08/2019), 840 mg (07/30/2019) DOCEtaxel (TAXOTERE) 160 mg in sodium chloride 0.9 % 250 mL chemo infusion, 75 mg/m2 = 160 mg, Intravenous,  Once, 4 of 4 cycles Dose modification: 50 mg/m2 (original dose 75 mg/m2, Cycle 3, Reason: Dose not tolerated) Administration: 160 mg (05/19/2019), 160 mg (06/09/2019), 110 mg (07/08/2019), 110 mg (07/30/2019) fosaprepitant (EMEND) 150 mg in sodium chloride 0.9 % 145 mL IVPB, , Intravenous,  Once, 4 of 4 cycles Administration:  (05/19/2019),  (06/09/2019),  (07/08/2019),  (07/30/2019)  for chemotherapy treatment.    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    09/20/2019 -  Chemotherapy   The patient had trastuzumab-dkst (OGIVRI) 714 mg in sodium chloride 0.9 % 250 mL chemo infusion, 8 mg/kg = 714 mg, Intravenous,  Once, 13 of 17 cycles Administration: 714 mg (09/20/2019), 546 mg (10/11/2019), 546 mg (12/16/2019), 546 mg (01/05/2020), 546 mg (01/27/2020), 546 mg (02/17/2020), 546 mg (03/09/2020), 546 mg (03/30/2020), 546 mg (04/20/2020), 546 mg (05/11/2020), 546 mg (05/31/2020), 546 mg (11/01/2019), 546 mg (11/22/2019)  for chemotherapy treatment.    10/07/2019 - 11/22/2019 Radiation Therapy   Adjuvant radiation   12/16/2019 -  Anti-estrogen oral therapy   Anastrozole, planned duration 7 years     CHIEF COMPLIANT: Herceptin maintenance  INTERVAL HISTORY: Elizabeth Sharp is a 55 y.o. with above-mentioned history of left breast cancertreated withneoadjuvant chemotherapy,left lumpectomy, radiation therapy,and is currently onadjuvant Herceptinand antiestrogen therapy with anastrozole.She presentsto the clinic todayfor treatment.   She is tolerating anastrozole and  Herceptin extremely well without any problems or concerns.  ALLERGIES:  has No Known Allergies.  MEDICATIONS:  Current Outpatient Medications  Medication Sig Dispense Refill    anastrozole (ARIMIDEX) 1 MG tablet Take 1 tablet (1 mg total) by mouth daily. (Patient not taking: Reported on 12/29/2019) 90 tablet 3   lidocaine-prilocaine (EMLA) cream Apply 1 application topically every 21 ( twenty-one) days. 30 g 1   No current facility-administered medications for this visit.    PHYSICAL EXAMINATION: ECOG PERFORMANCE STATUS: 0 - Asymptomatic  Vitals:   06/21/20 1017  BP: (!) 163/87  Pulse: 89  Resp: 16  Temp: (!) 97.3 F (36.3 C)  SpO2: 100%   Filed Weights   06/21/20 1017  Weight: 197 lb 11.2 oz (89.7 kg)    LABORATORY DATA:  I have reviewed the data as listed CMP Latest Ref Rng & Units 06/21/2020 05/11/2020 03/30/2020  Glucose 70 - 99 mg/dL 103(H) 110(H) 101(H)  BUN 6 - 20 mg/dL _0 Creatinine 0.44 - 1.00 mg/dL 0.68 0.69 0.72  Sodium 135 - 145 mmol/L 139 137 138  Potassium 3.5 - 5.1 mmol/L 4.2 4.4 3.8  Chloride 98 - 111 mmol/L 104 103 103  CO2 22 - 32 mmol/L _1 Calcium 8.9 - 10.3 mg/dL 9.1 9.6 9.3  Total Protein 6.5 - 8.1 g/dL 7.6 8.0 7.1  Total Bilirubin 0.3 - 1.2 mg/dL 1.1 0.8 0.6  Alkaline Phos 38 - 126 U/L 104 101 101  AST 15 - 41 U/L 103(H) 107(H) 79(H)  ALT 0 - 44 U/L 127(H) 117(H) 101(H)    Lab Results  Component Value Date   WBC 3.3 (L) 06/21/2020   HGB 12.5 06/21/2020   HCT 37.7 06/21/2020   MCV 95.2 06/21/2020   PLT 220 06/21/2020   NEUTROABS 1.8 06/21/2020    ASSESSMENT & PLAN:  Malignant neoplasm of upper-inner quadrant of left breast in female, estrogen receptor positive ([place]) 04/28/2019: Palpable left breast lump, mammogram 4.5 cm mass at 11:30 position and adjacent smaller masses, 1 abnormal left axillary lymph node: Biopsy of breast and axilla both positive for grade 3 IDC ER 95%, PR 90%, Ki-67 70%, HER-2 negative T2N1 stage IIb clinical stage  Recommendation: 1.Neoadjuvant chemotherapywith Taxotere and Cytoxan every 3 weeks x4completed 07/30/2019,Adjuvant Herceptinstarted 09/20/2019 2.Breast  conserving surgery with targeted node dissection3/23/2021 3.Adjuvant radiation therapy 10/07/2019-12/15/2019 4.Followed by adjuvant antiestrogen therapy Starting December 16, 2019 --------------------------------------------------------------------------------------------------------------------------------------------------------- Post neoadjuvant breast MRI 08/05/2019: Left breast cancer previously 3.8 cm is now 2.5 cm now ill-defined and less confluent. Retroareolar mass 1.3 cm is now minimal enhancement. Left axilla lymph node abnormal  Left lumpectomy 08/30/2019: Left upper outer quadrant: Grade 3 IDC 1.5 cm with high-grade DCIS, margins negative Left lateral lumpectomy: Grade 3 IDC microscopic focus margins negative, 1/3 lymph nodes positive Repeat prognostic panel: ER 50% moderate, PR 10% strong, Ki-67 2%, HER-2 equivocal by IHC FISH positive for HER-2 ratio 3.5 and copy #5.55. (Heterogeneity in HER-2 expression)  Current treatment:Adjuvant Herceptin every [redacted] weeks along with anastrozole Herceptin toxicities: None, Herceptin to be completed March 2022 Echocardiogram 02/06/2020: EF 55 to 60%  Elevated LFTs: Slowly coming down. Okay to treat.  Anastrozoletoxicities:Denies any hot flashes or myalgias.  Return to clinic every 3 weeks for Herceptin every 6 weeks to follow-up with me.    No orders of the defined types were placed in this encounter.  The patient has a good understanding of the overall plan. she agrees with it. she will call with any  problems that may develop before the next visit here.  Total time spent: 30 mins including face to face time and time spent for planning, charting and coordination of care  Nicholas Lose, MD 06/21/2020  I, Cloyde Reams Dorshimer, am acting as scribe for Dr. Nicholas Lose.  I have reviewed the above documentation for accuracy and completeness, and I agree with the above.

## 2020-06-21 ENCOUNTER — Encounter: Payer: Self-pay | Admitting: *Deleted

## 2020-06-21 ENCOUNTER — Other Ambulatory Visit: Payer: Self-pay

## 2020-06-21 ENCOUNTER — Inpatient Hospital Stay: Payer: BC Managed Care – PPO

## 2020-06-21 ENCOUNTER — Inpatient Hospital Stay (HOSPITAL_BASED_OUTPATIENT_CLINIC_OR_DEPARTMENT_OTHER): Payer: BC Managed Care – PPO | Admitting: Hematology and Oncology

## 2020-06-21 ENCOUNTER — Inpatient Hospital Stay: Payer: BC Managed Care – PPO | Attending: Hematology and Oncology

## 2020-06-21 DIAGNOSIS — R7989 Other specified abnormal findings of blood chemistry: Secondary | ICD-10-CM | POA: Insufficient documentation

## 2020-06-21 DIAGNOSIS — Z79899 Other long term (current) drug therapy: Secondary | ICD-10-CM | POA: Diagnosis not present

## 2020-06-21 DIAGNOSIS — Z923 Personal history of irradiation: Secondary | ICD-10-CM | POA: Insufficient documentation

## 2020-06-21 DIAGNOSIS — Z170 Estrogen receptor positive status [ER+]: Secondary | ICD-10-CM | POA: Insufficient documentation

## 2020-06-21 DIAGNOSIS — Z95828 Presence of other vascular implants and grafts: Secondary | ICD-10-CM

## 2020-06-21 DIAGNOSIS — C50212 Malignant neoplasm of upper-inner quadrant of left female breast: Secondary | ICD-10-CM | POA: Insufficient documentation

## 2020-06-21 DIAGNOSIS — Z5112 Encounter for antineoplastic immunotherapy: Secondary | ICD-10-CM | POA: Diagnosis not present

## 2020-06-21 DIAGNOSIS — C773 Secondary and unspecified malignant neoplasm of axilla and upper limb lymph nodes: Secondary | ICD-10-CM | POA: Insufficient documentation

## 2020-06-21 DIAGNOSIS — Z9221 Personal history of antineoplastic chemotherapy: Secondary | ICD-10-CM | POA: Insufficient documentation

## 2020-06-21 LAB — CBC WITH DIFFERENTIAL (CANCER CENTER ONLY)
Abs Immature Granulocytes: 0.01 10*3/uL (ref 0.00–0.07)
Basophils Absolute: 0 10*3/uL (ref 0.0–0.1)
Basophils Relative: 1 %
Eosinophils Absolute: 0.1 10*3/uL (ref 0.0–0.5)
Eosinophils Relative: 4 %
HCT: 37.7 % (ref 36.0–46.0)
Hemoglobin: 12.5 g/dL (ref 12.0–15.0)
Immature Granulocytes: 0 %
Lymphocytes Relative: 30 %
Lymphs Abs: 1 10*3/uL (ref 0.7–4.0)
MCH: 31.6 pg (ref 26.0–34.0)
MCHC: 33.2 g/dL (ref 30.0–36.0)
MCV: 95.2 fL (ref 80.0–100.0)
Monocytes Absolute: 0.3 10*3/uL (ref 0.1–1.0)
Monocytes Relative: 9 %
Neutro Abs: 1.8 10*3/uL (ref 1.7–7.7)
Neutrophils Relative %: 56 %
Platelet Count: 220 10*3/uL (ref 150–400)
RBC: 3.96 MIL/uL (ref 3.87–5.11)
RDW: 14.1 % (ref 11.5–15.5)
WBC Count: 3.3 10*3/uL — ABNORMAL LOW (ref 4.0–10.5)
nRBC: 0 % (ref 0.0–0.2)

## 2020-06-21 LAB — CMP (CANCER CENTER ONLY)
ALT: 127 U/L — ABNORMAL HIGH (ref 0–44)
AST: 103 U/L — ABNORMAL HIGH (ref 15–41)
Albumin: 3.8 g/dL (ref 3.5–5.0)
Alkaline Phosphatase: 104 U/L (ref 38–126)
Anion gap: 11 (ref 5–15)
BUN: 13 mg/dL (ref 6–20)
CO2: 24 mmol/L (ref 22–32)
Calcium: 9.1 mg/dL (ref 8.9–10.3)
Chloride: 104 mmol/L (ref 98–111)
Creatinine: 0.68 mg/dL (ref 0.44–1.00)
GFR, Estimated: >60 mL/min (ref >60)
Glucose, Bld: 103 mg/dL — ABNORMAL HIGH (ref 70–99)
Potassium: 4.2 mmol/L (ref 3.5–5.1)
Sodium: 139 mmol/L (ref 135–145)
Total Bilirubin: 1.1 mg/dL (ref 0.3–1.2)
Total Protein: 7.6 g/dL (ref 6.5–8.1)

## 2020-06-21 MED ORDER — SODIUM CHLORIDE 0.9% FLUSH
10.0000 mL | INTRAVENOUS | Status: DC | PRN
  Administered 2020-06-21: 10 mL
  Filled 2020-06-21: qty 10

## 2020-06-21 MED ORDER — ACETAMINOPHEN 325 MG PO TABS
ORAL_TABLET | ORAL | Status: AC
  Filled 2020-06-21: qty 2

## 2020-06-21 MED ORDER — ACETAMINOPHEN 325 MG PO TABS
650.0000 mg | ORAL_TABLET | Freq: Once | ORAL | Status: AC
  Administered 2020-06-21: 650 mg via ORAL

## 2020-06-21 MED ORDER — SODIUM CHLORIDE 0.9% FLUSH
10.0000 mL | Freq: Once | INTRAVENOUS | Status: AC
  Administered 2020-06-21: 10 mL
  Filled 2020-06-21: qty 10

## 2020-06-21 MED ORDER — DIPHENHYDRAMINE HCL 25 MG PO CAPS
ORAL_CAPSULE | ORAL | Status: AC
  Filled 2020-06-21: qty 2

## 2020-06-21 MED ORDER — SODIUM CHLORIDE 0.9 % IV SOLN
Freq: Once | INTRAVENOUS | Status: AC
  Filled 2020-06-21: qty 250

## 2020-06-21 MED ORDER — DIPHENHYDRAMINE HCL 25 MG PO CAPS
50.0000 mg | ORAL_CAPSULE | Freq: Once | ORAL | Status: AC
  Administered 2020-06-21: 50 mg via ORAL

## 2020-06-21 MED ORDER — HEPARIN SOD (PORK) LOCK FLUSH 100 UNIT/ML IV SOLN
500.0000 [IU] | Freq: Once | INTRAVENOUS | Status: AC | PRN
  Administered 2020-06-21: 500 [IU]
  Filled 2020-06-21: qty 5

## 2020-06-21 MED ORDER — TRASTUZUMAB-DKST CHEMO 150 MG IV SOLR
6.0000 mg/kg | Freq: Once | INTRAVENOUS | Status: AC
  Administered 2020-06-21: 546 mg via INTRAVENOUS
  Filled 2020-06-21: qty 26

## 2020-06-21 NOTE — Assessment & Plan Note (Signed)
04/28/2019: Palpable left breast lump, mammogram 4.5 cm mass at 11:30 position and adjacent smaller masses, 1 abnormal left axillary lymph node: Biopsy of breast and axilla both positive for grade 3 IDC ER 95%, PR 90%, Ki-67 70%, HER-2 negative T2N1 stage IIb clinical stage  Recommendation: 1.Neoadjuvant chemotherapywith Taxotere and Cytoxan every 3 weeks x4completed 07/30/2019,Adjuvant Herceptinstarted 09/20/2019 2.Breast conserving surgery with targeted node dissection3/23/2021 3.Adjuvant radiation therapy 10/07/2019-12/15/2019 4.Followed by adjuvant antiestrogen therapy Starting December 16, 2019 --------------------------------------------------------------------------------------------------------------------------------------------------------- Post neoadjuvant breast MRI 08/05/2019: Left breast cancer previously 3.8 cm is now 2.5 cm now ill-defined and less confluent. Retroareolar mass 1.3 cm is now minimal enhancement. Left axilla lymph node abnormal  Left lumpectomy 08/30/2019: Left upper outer quadrant: Grade 3 IDC 1.5 cm with high-grade DCIS, margins negative Left lateral lumpectomy: Grade 3 IDC microscopic focus margins negative, 1/3 lymph nodes positive Repeat prognostic panel: ER 50% moderate, PR 10% strong, Ki-67 2%, HER-2 equivocal by IHC FISH positive for HER-2 ratio 3.5 and copy #5.55. (Heterogeneity in HER-2 expression)  Current treatment:Adjuvant Herceptin every [redacted] weeks along with anastrozole Herceptin toxicities: None, Herceptin to be completed March 2022 Echocardiogram 02/06/2020: EF 55 to 60%  Elevated LFTs: Slowly coming down.  Okay to treat.  Anastrozoletoxicities:Denies any hot flashes or myalgias.  Return to clinic every 3 weeks for Herceptin every 6 weeks to follow-up with me.  

## 2020-06-21 NOTE — Progress Notes (Signed)
Okay to treat with echocardiogram from 01/2020 per MD.  Echocardiogram scheduled for 1/25 @ 9am - Infusion nurse notified - pt aware.

## 2020-06-21 NOTE — Patient Instructions (Signed)
[facility] Discharge Instructions for Patients Receiving Chemotherapy  Today you received the following chemotherapy agents trastuzumab.  To help prevent nausea and vomiting after your treatment, we encourage you to take your nausea medication as directed.    If you develop nausea and vomiting that is not controlled by your nausea medication, call the clinic.   BELOW ARE SYMPTOMS THAT SHOULD BE REPORTED IMMEDIATELY:  *FEVER GREATER THAN 100.5 F  *CHILLS WITH OR WITHOUT FEVER  NAUSEA AND VOMITING THAT IS NOT CONTROLLED WITH YOUR NAUSEA MEDICATION  *UNUSUAL SHORTNESS OF BREATH  *UNUSUAL BRUISING OR BLEEDING  TENDERNESS IN MOUTH AND THROAT WITH OR WITHOUT PRESENCE OF ULCERS  *URINARY PROBLEMS  *BOWEL PROBLEMS  UNUSUAL RASH Items with * indicate a potential emergency and should be followed up as soon as possible.  Feel free to call the clinic should you have any questions or concerns. The clinic phone number is [phone].  Please show the CHEMO ALERT CARD at check-in to the Emergency Department and triage nurse.   

## 2020-06-25 ENCOUNTER — Telehealth: Payer: Self-pay | Admitting: Hematology and Oncology

## 2020-06-25 NOTE — Telephone Encounter (Signed)
No 1/13 los, no changes made to pt schedule  

## 2020-07-03 ENCOUNTER — Ambulatory Visit (HOSPITAL_COMMUNITY)
Admission: RE | Admit: 2020-07-03 | Discharge: 2020-07-03 | Disposition: A | Discharge status: Home / Self Care | Payer: BC Managed Care – PPO | Source: Ambulatory Visit | Attending: Hematology and Oncology | Admitting: Hematology and Oncology

## 2020-07-03 ENCOUNTER — Other Ambulatory Visit: Payer: Self-pay

## 2020-07-03 DIAGNOSIS — Z0189 Encounter for other specified special examinations: Secondary | ICD-10-CM | POA: Diagnosis not present

## 2020-07-03 DIAGNOSIS — Z170 Estrogen receptor positive status [ER+]: Secondary | ICD-10-CM | POA: Diagnosis not present

## 2020-07-03 DIAGNOSIS — C50212 Malignant neoplasm of upper-inner quadrant of left female breast: Secondary | ICD-10-CM | POA: Diagnosis not present

## 2020-07-03 DIAGNOSIS — Z01818 Encounter for other preprocedural examination: Secondary | ICD-10-CM | POA: Diagnosis not present

## 2020-07-03 LAB — ECHOCARDIOGRAM COMPLETE
Area-P 1/2: 4.68 cm2
S' Lateral: 3.4 cm

## 2020-07-03 NOTE — Progress Notes (Signed)
  Echocardiogram 2D Echocardiogram has been performed.  Elizabeth Sharp 07/03/2020, 10:00 AM

## 2020-07-13 ENCOUNTER — Inpatient Hospital Stay: Payer: BC Managed Care – PPO | Attending: Hematology and Oncology

## 2020-07-13 ENCOUNTER — Other Ambulatory Visit: Payer: Self-pay

## 2020-07-13 VITALS — BP 164/90 | HR 78 | Resp 16

## 2020-07-13 DIAGNOSIS — Z5112 Encounter for antineoplastic immunotherapy: Secondary | ICD-10-CM | POA: Insufficient documentation

## 2020-07-13 DIAGNOSIS — R7989 Other specified abnormal findings of blood chemistry: Secondary | ICD-10-CM | POA: Diagnosis not present

## 2020-07-13 DIAGNOSIS — Z170 Estrogen receptor positive status [ER+]: Secondary | ICD-10-CM | POA: Diagnosis not present

## 2020-07-13 DIAGNOSIS — Z79899 Other long term (current) drug therapy: Secondary | ICD-10-CM | POA: Insufficient documentation

## 2020-07-13 DIAGNOSIS — R232 Flushing: Secondary | ICD-10-CM | POA: Diagnosis not present

## 2020-07-13 DIAGNOSIS — C50212 Malignant neoplasm of upper-inner quadrant of left female breast: Secondary | ICD-10-CM | POA: Diagnosis not present

## 2020-07-13 MED ORDER — SODIUM CHLORIDE 0.9% FLUSH
10.0000 mL | INTRAVENOUS | Status: DC | PRN
  Administered 2020-07-13: 10 mL
  Filled 2020-07-13: qty 10

## 2020-07-13 MED ORDER — SODIUM CHLORIDE 0.9 % IV SOLN
Freq: Once | INTRAVENOUS | Status: AC
  Filled 2020-07-13: qty 250

## 2020-07-13 MED ORDER — DIPHENHYDRAMINE HCL 25 MG PO CAPS
50.0000 mg | ORAL_CAPSULE | Freq: Once | ORAL | Status: AC
  Administered 2020-07-13: 50 mg via ORAL

## 2020-07-13 MED ORDER — HEPARIN SOD (PORK) LOCK FLUSH 100 UNIT/ML IV SOLN
500.0000 [IU] | Freq: Once | INTRAVENOUS | Status: AC | PRN
  Administered 2020-07-13: 500 [IU]
  Filled 2020-07-13: qty 5

## 2020-07-13 MED ORDER — DIPHENHYDRAMINE HCL 25 MG PO CAPS
ORAL_CAPSULE | ORAL | Status: AC
  Filled 2020-07-13: qty 2

## 2020-07-13 MED ORDER — ACETAMINOPHEN 325 MG PO TABS
ORAL_TABLET | ORAL | Status: AC
  Filled 2020-07-13: qty 2

## 2020-07-13 MED ORDER — ACETAMINOPHEN 325 MG PO TABS
650.0000 mg | ORAL_TABLET | Freq: Once | ORAL | Status: AC
  Administered 2020-07-13: 650 mg via ORAL

## 2020-07-13 MED ORDER — TRASTUZUMAB-DKST CHEMO 150 MG IV SOLR
6.0000 mg/kg | Freq: Once | INTRAVENOUS | Status: AC
  Administered 2020-07-13: 546 mg via INTRAVENOUS
  Filled 2020-07-13: qty 26

## 2020-07-13 NOTE — Patient Instructions (Signed)
[facility] Discharge Instructions for Patients Receiving Chemotherapy  Today you received the following chemotherapy agents trastuzumab.  To help prevent nausea and vomiting after your treatment, we encourage you to take your nausea medication as directed.    If you develop nausea and vomiting that is not controlled by your nausea medication, call the clinic.   BELOW ARE SYMPTOMS THAT SHOULD BE REPORTED IMMEDIATELY:  *FEVER GREATER THAN 100.5 F  *CHILLS WITH OR WITHOUT FEVER  NAUSEA AND VOMITING THAT IS NOT CONTROLLED WITH YOUR NAUSEA MEDICATION  *UNUSUAL SHORTNESS OF BREATH  *UNUSUAL BRUISING OR BLEEDING  TENDERNESS IN MOUTH AND THROAT WITH OR WITHOUT PRESENCE OF ULCERS  *URINARY PROBLEMS  *BOWEL PROBLEMS  UNUSUAL RASH Items with * indicate a potential emergency and should be followed up as soon as possible.  Feel free to call the clinic should you have any questions or concerns. The clinic phone number is [phone].  Please show the CHEMO ALERT CARD at check-in to the Emergency Department and triage nurse.   

## 2020-08-02 NOTE — Assessment & Plan Note (Signed)
04/28/2019: Palpable left breast lump, mammogram 4.5 cm mass at 11:30 position and adjacent smaller masses, 1 abnormal left axillary lymph node: Biopsy of breast and axilla both positive for grade 3 IDC ER 95%, PR 90%, Ki-67 70%, HER-2 negative T2N1 stage IIb clinical stage  Recommendation: 1.Neoadjuvant chemotherapywith Taxotere and Cytoxan every 3 weeks x4completed 07/30/2019,Adjuvant Herceptinstarted 09/20/2019 2.Breast conserving surgery with targeted node dissection3/23/2021 3.Adjuvant radiation therapy 10/07/2019-12/15/2019 4.Followed by adjuvant antiestrogen therapy Starting December 16, 2019 --------------------------------------------------------------------------------------------------------------------------------------------------------- Post neoadjuvant breast MRI 08/05/2019: Left breast cancer previously 3.8 cm is now 2.5 cm now ill-defined and less confluent. Retroareolar mass 1.3 cm is now minimal enhancement. Left axilla lymph node abnormal  Left lumpectomy 08/30/2019: Left upper outer quadrant: Grade 3 IDC 1.5 cm with high-grade DCIS, margins negative Left lateral lumpectomy: Grade 3 IDC microscopic focus margins negative, 1/3 lymph nodes positive Repeat prognostic panel: ER 50% moderate, PR 10% strong, Ki-67 2%, HER-2 equivocal by IHC FISH positive for HER-2 ratio 3.5 and copy #5.55. (Heterogeneity in HER-2 expression)  Current treatment:Adjuvant Herceptin every [redacted] weeks along with anastrozole Herceptin toxicities: None, Herceptin to be completed March 2022 Echocardiogram 02/06/2020: EF 55 to 60%  Elevated LFTs: Slowly coming down. Okay to treat.  Anastrozoletoxicities:Denies any hot flashes or myalgias.  Return to clinic every 3 weeks for Herceptin every 6 weeks to follow-up with me.  

## 2020-08-02 NOTE — Progress Notes (Signed)
Patient Care Team: Rochel Brome, MD as PCP - General (Family Medicine) Rockwell Germany, RN as Oncology Nurse Navigator Mauro Kaufmann, RN as Oncology Nurse Navigator  DIAGNOSIS:    ICD-10-CM   1. Malignant neoplasm of upper-inner quadrant of left breast in female, estrogen receptor positive ([place])  C50.212    Z17.0     SUMMARY OF ONCOLOGIC HISTORY: Oncology History  Malignant neoplasm of upper-inner quadrant of left breast in female, estrogen receptor positive ([place])  04/28/2019 Initial Diagnosis   Palpable left breast lump.  Mammogram 04/21/19 showed a 4.5cm mass at the 11:30 position with smaller adjacent solid masses and one abnormal left axillary lymph node. 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   05/10/2019 Cancer Staging   Staging form: Breast, AJCC 8th Edition - Clinical stage from 05/10/2019: Stage IIB (cT2, cN1, cM0, G3, ER+, PR+, HER2-) - Signed by Nicholas Lose, MD on 05/10/2019   05/19/2019 - 08/01/2019 Chemotherapy   The patient had dexamethasone (DECADRON) 4 MG tablet, 4 mg (100 % of original dose 4 mg), Oral, Daily, 1 of 1 cycle, Start date: 06/20/2019, End date: 08/22/2019 Dose modification: 4 mg (original dose 4 mg, Cycle 0) palonosetron (ALOXI) injection 0.25 mg, 0.25 mg, Intravenous,  Once, 4 of 4 cycles Administration: 0.25 mg (05/19/2019), 0.25 mg (06/09/2019), 0.25 mg (07/08/2019), 0.25 mg (07/30/2019) pegfilgrastim-jmdb (FULPHILA) injection 6 mg, 6 mg, Subcutaneous,  Once, 4 of 4 cycles Administration: 6 mg (05/20/2019), 6 mg (06/11/2019), 6 mg (07/11/2019), 6 mg (08/01/2019) cyclophosphamide (CYTOXAN) 1,280 mg in sodium chloride 0.9 % 250 mL chemo infusion, 600 mg/m2 = 1,280 mg, Intravenous,  Once, 4 of 4 cycles Dose modification: 400 mg/m2 (original dose 600 mg/m2, Cycle 3, Reason: Dose not tolerated) Administration: 1,280 mg (05/19/2019), 1,280 mg (06/09/2019), 840 mg  (07/08/2019), 840 mg (07/30/2019) DOCEtaxel (TAXOTERE) 160 mg in sodium chloride 0.9 % 250 mL chemo infusion, 75 mg/m2 = 160 mg, Intravenous,  Once, 4 of 4 cycles Dose modification: 50 mg/m2 (original dose 75 mg/m2, Cycle 3, Reason: Dose not tolerated) Administration: 160 mg (05/19/2019), 160 mg (06/09/2019), 110 mg (07/08/2019), 110 mg (07/30/2019) fosaprepitant (EMEND) 150 mg in sodium chloride 0.9 % 145 mL IVPB, , Intravenous,  Once, 4 of 4 cycles Administration:  (05/19/2019),  (06/09/2019),  (07/08/2019),  (07/30/2019)  for chemotherapy treatment.    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    09/20/2019 -  Chemotherapy      Patient is on Antibody Plan: BREAST TRASTUZUMAB Q21D    10/07/2019 - 11/22/2019 Radiation Therapy   Adjuvant radiation   12/16/2019 -  Anti-estrogen oral therapy   Anastrozole, planned duration 7 years     CHIEF COMPLIANT: Herceptin maintenance  INTERVAL HISTORY: Elizabeth Sharp is a 55 y.o. with above-mentioned history of left breast cancertreated withneoadjuvant chemotherapy,left lumpectomy, radiation therapy,and is currently onadjuvant Herceptinand antiestrogen therapy with anastrozole.She presentsto the clinic todayfor treatment.  She is tolerating Herceptin extremely well. Her major complaint is related to anastrozole causing severe hot flashes.  ALLERGIES:  has No Known Allergies.  MEDICATIONS:  Current Outpatient Medications  Medication Sig Dispense Refill  . anastrozole (ARIMIDEX) 1 MG tablet Take 1 tablet (1 mg total) by mouth daily. (Patient not taking: Reported on 12/29/2019) 90 tablet 3  . lidocaine-prilocaine (EMLA) cream Apply 1  application topically every 21 ( twenty-one) days. 30 g 1   No current facility-administered medications for this visit.    PHYSICAL EXAMINATION: ECOG PERFORMANCE STATUS: 1 - Symptomatic but  completely ambulatory  Vitals:   08/03/20 1152  BP: (!) 178/88  Pulse: 88  Resp: 18  Temp: 98.1 F (36.7 C)  SpO2: 99%   Filed Weights   08/03/20 1152  Weight: 198 lb 4.8 oz (89.9 kg)    LABORATORY DATA:  I have reviewed the data as listed CMP Latest Ref Rng & Units 06/21/2020 05/11/2020 03/30/2020  Glucose 70 - 99 mg/dL 103(H) 110(H) 101(H)  BUN 6 - 20 mg/dL _0 Creatinine 0.44 - 1.00 mg/dL 0.68 0.69 0.72  Sodium 135 - 145 mmol/L 139 137 138  Potassium 3.5 - 5.1 mmol/L 4.2 4.4 3.8  Chloride 98 - 111 mmol/L 104 103 103  CO2 22 - 32 mmol/L _1 Calcium 8.9 - 10.3 mg/dL 9.1 9.6 9.3  Total Protein 6.5 - 8.1 g/dL 7.6 8.0 7.1  Total Bilirubin 0.3 - 1.2 mg/dL 1.1 0.8 0.6  Alkaline Phos 38 - 126 U/L 104 101 101  AST 15 - 41 U/L 103(H) 107(H) 79(H)  ALT 0 - 44 U/L 127(H) 117(H) 101(H)    Lab Results  Component Value Date   WBC 4.5 08/03/2020   HGB 12.8 08/03/2020   HCT 38.0 08/03/2020   MCV 95.0 08/03/2020   PLT 245 08/03/2020   NEUTROABS 2.6 08/03/2020    ASSESSMENT & PLAN:  Malignant neoplasm of upper-inner quadrant of left breast in female, estrogen receptor positive ([place]) 04/28/2019: Palpable left breast lump, mammogram 4.5 cm mass at 11:30 position and adjacent smaller masses, 1 abnormal left axillary lymph node: Biopsy of breast and axilla both positive for grade 3 IDC ER 95%, PR 90%, Ki-67 70%, HER-2 negative T2N1 stage IIb clinical stage  Recommendation: 1.Neoadjuvant chemotherapywith Taxotere and Cytoxan every 3 weeks x4completed 07/30/2019,Adjuvant Herceptinstarted 09/20/2019 2.Breast conserving surgery with targeted node dissection3/23/2021 3.Adjuvant radiation therapy 10/07/2019-12/15/2019 4.Followed by adjuvant antiestrogen therapy Starting December 16, 2019 --------------------------------------------------------------------------------------------------------------------------------------------------------- Post neoadjuvant breast MRI  08/05/2019: Left breast cancer previously 3.8 cm is now 2.5 cm now ill-defined and less confluent. Retroareolar mass 1.3 cm is now minimal enhancement. Left axilla lymph node abnormal  Left lumpectomy 08/30/2019: Left upper outer quadrant: Grade 3 IDC 1.5 cm with high-grade DCIS, margins negative Left lateral lumpectomy: Grade 3 IDC microscopic focus margins negative, 1/3 lymph nodes positive Repeat prognostic panel: ER 50% moderate, PR 10% strong, Ki-67 2%, HER-2 equivocal by IHC FISH positive for HER-2 ratio 3.5 and copy #5.55. (Heterogeneity in HER-2 expression)  Current treatment:Adjuvant Herceptin every [redacted] weeks along with anastrozole Herceptin toxicities: None, Herceptin to be completed March 2022 Echocardiogram 02/06/2020: EF 55 to 60%  Elevated LFTs: Slowly coming down. Okay to treat.  Anastrozoletoxicities: Severe hot flashes: I sent a prescription for Effexor.  She'll finish Herceptin in 3 weeks. I sent a message to Dr. Barry Dienes to remove the port. I ordered her mammogram and bone density test.   Return to clinic in 3 to 4 months for survivorship care plan visit. After that I can see her once a year.  No orders of the defined types were placed in this encounter.  The patient has a good understanding of the overall plan. she agrees with it. she will call with any problems that may develop before the next visit here.  Total time spent: 30 mins including face to face time and  time spent for planning, charting and coordination of care  Rulon Eisenmenger, MD, MPH 08/03/2020  I, Molly Dorshimer, am acting as scribe for Dr. Nicholas Lose.  I have reviewed the above documentation for accuracy and completeness, and I agree with the above.

## 2020-08-03 ENCOUNTER — Inpatient Hospital Stay: Payer: BC Managed Care – PPO

## 2020-08-03 ENCOUNTER — Other Ambulatory Visit: Payer: Self-pay | Admitting: *Deleted

## 2020-08-03 ENCOUNTER — Inpatient Hospital Stay: Payer: BC Managed Care – PPO | Admitting: Hematology and Oncology

## 2020-08-03 ENCOUNTER — Other Ambulatory Visit: Payer: Self-pay

## 2020-08-03 VITALS — BP 149/101 | HR 95

## 2020-08-03 VITALS — BP 178/88 | HR 88 | Temp 98.1°F | Resp 18 | Ht 69.0 in | Wt 198.3 lb

## 2020-08-03 DIAGNOSIS — Z79899 Other long term (current) drug therapy: Secondary | ICD-10-CM | POA: Diagnosis not present

## 2020-08-03 DIAGNOSIS — Z170 Estrogen receptor positive status [ER+]: Secondary | ICD-10-CM | POA: Diagnosis not present

## 2020-08-03 DIAGNOSIS — C50212 Malignant neoplasm of upper-inner quadrant of left female breast: Secondary | ICD-10-CM

## 2020-08-03 DIAGNOSIS — Z5112 Encounter for antineoplastic immunotherapy: Secondary | ICD-10-CM | POA: Diagnosis not present

## 2020-08-03 DIAGNOSIS — R748 Abnormal levels of other serum enzymes: Secondary | ICD-10-CM

## 2020-08-03 DIAGNOSIS — R232 Flushing: Secondary | ICD-10-CM | POA: Diagnosis not present

## 2020-08-03 DIAGNOSIS — R7989 Other specified abnormal findings of blood chemistry: Secondary | ICD-10-CM | POA: Diagnosis not present

## 2020-08-03 DIAGNOSIS — Z95828 Presence of other vascular implants and grafts: Secondary | ICD-10-CM

## 2020-08-03 DIAGNOSIS — Z780 Asymptomatic menopausal state: Secondary | ICD-10-CM

## 2020-08-03 LAB — CMP (CANCER CENTER ONLY)
ALT: 220 U/L — ABNORMAL HIGH (ref 0–44)
AST: 212 U/L (ref 15–41)
Albumin: 3.9 g/dL (ref 3.5–5.0)
Alkaline Phosphatase: 109 U/L (ref 38–126)
Anion gap: 13 (ref 5–15)
BUN: 15 mg/dL (ref 6–20)
CO2: 24 mmol/L (ref 22–32)
Calcium: 10 mg/dL (ref 8.9–10.3)
Chloride: 101 mmol/L (ref 98–111)
Creatinine: 0.69 mg/dL (ref 0.44–1.00)
GFR, Estimated: >60 mL/min (ref >60)
Glucose, Bld: 105 mg/dL — ABNORMAL HIGH (ref 70–99)
Potassium: 4.2 mmol/L (ref 3.5–5.1)
Sodium: 138 mmol/L (ref 135–145)
Total Bilirubin: 1 mg/dL (ref 0.3–1.2)
Total Protein: 7.8 g/dL (ref 6.5–8.1)

## 2020-08-03 LAB — CBC WITH DIFFERENTIAL (CANCER CENTER ONLY)
Abs Immature Granulocytes: 0.02 10*3/uL (ref 0.00–0.07)
Basophils Absolute: 0.1 10*3/uL (ref 0.0–0.1)
Basophils Relative: 2 %
Eosinophils Absolute: 0.1 10*3/uL (ref 0.0–0.5)
Eosinophils Relative: 3 %
HCT: 38 % (ref 36.0–46.0)
Hemoglobin: 12.8 g/dL (ref 12.0–15.0)
Immature Granulocytes: 0 %
Lymphocytes Relative: 26 %
Lymphs Abs: 1.2 10*3/uL (ref 0.7–4.0)
MCH: 32 pg (ref 26.0–34.0)
MCHC: 33.7 g/dL (ref 30.0–36.0)
MCV: 95 fL (ref 80.0–100.0)
Monocytes Absolute: 0.5 10*3/uL (ref 0.1–1.0)
Monocytes Relative: 11 %
Neutro Abs: 2.6 10*3/uL (ref 1.7–7.7)
Neutrophils Relative %: 58 %
Platelet Count: 245 10*3/uL (ref 150–400)
RBC: 4 MIL/uL (ref 3.87–5.11)
RDW: 14 % (ref 11.5–15.5)
WBC Count: 4.5 10*3/uL (ref 4.0–10.5)
nRBC: 0 % (ref 0.0–0.2)

## 2020-08-03 MED ORDER — SODIUM CHLORIDE 0.9% FLUSH
10.0000 mL | Freq: Once | INTRAVENOUS | Status: AC
  Administered 2020-08-03: 10 mL
  Filled 2020-08-03: qty 10

## 2020-08-03 MED ORDER — SODIUM CHLORIDE 0.9% FLUSH
10.0000 mL | INTRAVENOUS | Status: DC | PRN
  Filled 2020-08-03: qty 10

## 2020-08-03 MED ORDER — TRASTUZUMAB-DKST CHEMO 150 MG IV SOLR
6.0000 mg/kg | Freq: Once | INTRAVENOUS | Status: AC
  Administered 2020-08-03: 546 mg via INTRAVENOUS
  Filled 2020-08-03: qty 26

## 2020-08-03 MED ORDER — ACETAMINOPHEN 325 MG PO TABS
ORAL_TABLET | ORAL | Status: AC
  Filled 2020-08-03: qty 2

## 2020-08-03 MED ORDER — SODIUM CHLORIDE 0.9 % IV SOLN
Freq: Once | INTRAVENOUS | Status: AC
  Filled 2020-08-03: qty 250

## 2020-08-03 MED ORDER — DIPHENHYDRAMINE HCL 25 MG PO CAPS
50.0000 mg | ORAL_CAPSULE | Freq: Once | ORAL | Status: AC
Start: 2020-08-03 — End: 2020-08-03
  Administered 2020-08-03: 50 mg via ORAL

## 2020-08-03 MED ORDER — ACETAMINOPHEN 325 MG PO TABS
650.0000 mg | ORAL_TABLET | Freq: Once | ORAL | Status: AC
  Administered 2020-08-03: 650 mg via ORAL

## 2020-08-03 MED ORDER — DIPHENHYDRAMINE HCL 25 MG PO CAPS
ORAL_CAPSULE | ORAL | Status: AC
  Filled 2020-08-03: qty 2

## 2020-08-03 MED ORDER — VENLAFAXINE HCL ER 37.5 MG PO CP24: 37.5000 mg | ORAL_CAPSULE | Freq: Every day | ORAL | 6 refills | Status: DC

## 2020-08-03 MED ORDER — HEPARIN SOD (PORK) LOCK FLUSH 100 UNIT/ML IV SOLN
500.0000 [IU] | Freq: Once | INTRAVENOUS | Status: DC | PRN
  Filled 2020-08-03: qty 5

## 2020-08-03 NOTE — Patient Instructions (Signed)
[facility] Discharge Instructions for Patients Receiving Chemotherapy  Today you received the following immunotherapy agent: Trastuzumab  To help prevent nausea and vomiting after your treatment, we encourage you to take your nausea medication as directed by your MD.   If you develop nausea and vomiting that is not controlled by your nausea medication, call the clinic.   BELOW ARE SYMPTOMS THAT SHOULD BE REPORTED IMMEDIATELY:  *FEVER GREATER THAN 100.5 F  *CHILLS WITH OR WITHOUT FEVER  NAUSEA AND VOMITING THAT IS NOT CONTROLLED WITH YOUR NAUSEA MEDICATION  *UNUSUAL SHORTNESS OF BREATH  *UNUSUAL BRUISING OR BLEEDING  TENDERNESS IN MOUTH AND THROAT WITH OR WITHOUT PRESENCE OF ULCERS  *URINARY PROBLEMS  *BOWEL PROBLEMS  UNUSUAL RASH Items with * indicate a potential emergency and should be followed up as soon as possible.  Feel free to call the clinic should you have any questions or concerns. The clinic phone number is [phone].  Please show the CHEMO ALERT CARD at check-in to the Emergency Department and triage nurse.   

## 2020-08-03 NOTE — Progress Notes (Signed)
Pt. BP elevated, denies complaints of chest pain, dizziness, and no SOB noted. Per Dr. Lindi Adie, ok for treatment today with elevated BP. Per Dr. Lindi Adie, RN to schedule liver ultrasound due to elevated AST/ALT.

## 2020-08-03 NOTE — Patient Instructions (Signed)
Implanted Port Insertion, Care After This sheet gives you information about how to care for yourself after your procedure. Your health care provider may also give you more specific instructions. If you have problems or questions, contact your health care provider. What can I expect after the procedure? After the procedure, it is common to have:  Discomfort at the port insertion site.  Bruising on the skin over the port. This should improve over 3-4 days. Follow these instructions at home: Port care  After your port is placed, you will get a manufacturer's information card. The card has information about your port. Keep this card with you at all times.  Take care of the port as told by your health care provider. Ask your health care provider if you or a family member can get training for taking care of the port at home. A home health care nurse may also take care of the port.  Make sure to remember what type of port you have. Incision care  Follow instructions from your health care provider about how to take care of your port insertion site. Make sure you: ? Wash your hands with soap and water before and after you change your bandage (dressing). If soap and water are not available, use hand sanitizer. ? Change your dressing as told by your health care provider. ? Leave stitches (sutures), skin glue, or adhesive strips in place. These skin closures may need to stay in place for 2 weeks or longer. If adhesive strip edges start to loosen and curl up, you may trim the loose edges. Do not remove adhesive strips completely unless your health care provider tells you to do that.  Check your port insertion site every day for signs of infection. Check for: ? Redness, swelling, or pain. ? Fluid or blood. ? Warmth. ? Pus or a bad smell.      Activity  Return to your normal activities as told by your health care provider. Ask your health care provider what activities are safe for you.  Do not  lift anything that is heavier than 10 lb (4.5 kg), or the limit that you are told, until your health care provider says that it is safe. General instructions  Take over-the-counter and prescription medicines only as told by your health care provider.  Do not take baths, swim, or use a hot tub until your health care provider approves. Ask your health care provider if you may take showers. You may only be allowed to take sponge baths.  Do not drive for 24 hours if you were given a sedative during your procedure.  Wear a medical alert bracelet in case of an emergency. This will tell any health care providers that you have a port.  Keep all follow-up visits as told by your health care provider. This is important. Contact a health care provider if:  You cannot flush your port with saline as directed, or you cannot draw blood from the port.  You have a fever or chills.  You have redness, swelling, or pain around your port insertion site.  You have fluid or blood coming from your port insertion site.  Your port insertion site feels warm to the touch.  You have pus or a bad smell coming from the port insertion site. Get help right away if:  You have chest pain or shortness of breath.  You have bleeding from your port that you cannot control. Summary  Take care of the port as told by your   health care provider. Keep the manufacturer's information card with you at all times.  Change your dressing as told by your health care provider.  Contact a health care provider if you have a fever or chills or if you have redness, swelling, or pain around your port insertion site.  Keep all follow-up visits as told by your health care provider. This information is not intended to replace advice given to you by your health care provider. Make sure you discuss any questions you have with your health care provider. Document Revised: 12/22/2017 Document Reviewed: 12/22/2017 Elsevier Patient Education   2021 [employer]  

## 2020-08-07 ENCOUNTER — Telehealth: Payer: Self-pay | Admitting: Hematology and Oncology

## 2020-08-07 ENCOUNTER — Other Ambulatory Visit: Payer: Self-pay | Admitting: General Surgery

## 2020-08-07 NOTE — Telephone Encounter (Signed)
Scheduled per 2/25 los. Called pt and left a msg

## 2020-08-17 ENCOUNTER — Ambulatory Visit (HOSPITAL_COMMUNITY): Payer: BC Managed Care – PPO

## 2020-08-18 ENCOUNTER — Encounter (HOSPITAL_COMMUNITY): Payer: Self-pay

## 2020-08-21 ENCOUNTER — Encounter: Payer: Self-pay | Admitting: *Deleted

## 2020-08-24 ENCOUNTER — Other Ambulatory Visit: Payer: Self-pay

## 2020-08-24 ENCOUNTER — Ambulatory Visit: Payer: BC Managed Care – PPO

## 2020-08-24 ENCOUNTER — Inpatient Hospital Stay: Payer: BC Managed Care – PPO | Attending: Hematology and Oncology

## 2020-08-24 VITALS — BP 159/92 | HR 76 | Temp 98.6°F | Resp 18

## 2020-08-24 DIAGNOSIS — Z5112 Encounter for antineoplastic immunotherapy: Secondary | ICD-10-CM | POA: Insufficient documentation

## 2020-08-24 DIAGNOSIS — Z79899 Other long term (current) drug therapy: Secondary | ICD-10-CM | POA: Insufficient documentation

## 2020-08-24 DIAGNOSIS — Z170 Estrogen receptor positive status [ER+]: Secondary | ICD-10-CM | POA: Insufficient documentation

## 2020-08-24 DIAGNOSIS — C50212 Malignant neoplasm of upper-inner quadrant of left female breast: Secondary | ICD-10-CM | POA: Diagnosis not present

## 2020-08-24 MED ORDER — DIPHENHYDRAMINE HCL 25 MG PO CAPS
ORAL_CAPSULE | ORAL | Status: AC
  Filled 2020-08-24: qty 1

## 2020-08-24 MED ORDER — SODIUM CHLORIDE 0.9 % IV SOLN
Freq: Once | INTRAVENOUS | Status: AC
  Filled 2020-08-24: qty 250

## 2020-08-24 MED ORDER — TRASTUZUMAB-DKST CHEMO 150 MG IV SOLR
6.0000 mg/kg | Freq: Once | INTRAVENOUS | Status: AC
  Administered 2020-08-24: 546 mg via INTRAVENOUS
  Filled 2020-08-24: qty 26

## 2020-08-24 MED ORDER — HEPARIN SOD (PORK) LOCK FLUSH 100 UNIT/ML IV SOLN
500.0000 [IU] | Freq: Once | INTRAVENOUS | Status: AC | PRN
  Administered 2020-08-24: 500 [IU]
  Filled 2020-08-24: qty 5

## 2020-08-24 MED ORDER — ACETAMINOPHEN 325 MG PO TABS
650.0000 mg | ORAL_TABLET | Freq: Once | ORAL | Status: AC
  Administered 2020-08-24: 650 mg via ORAL

## 2020-08-24 MED ORDER — SODIUM CHLORIDE 0.9% FLUSH
10.0000 mL | INTRAVENOUS | Status: DC | PRN
  Administered 2020-08-24: 10 mL
  Filled 2020-08-24: qty 10

## 2020-08-24 MED ORDER — DIPHENHYDRAMINE HCL 25 MG PO CAPS
50.0000 mg | ORAL_CAPSULE | Freq: Once | ORAL | Status: AC
  Administered 2020-08-24: 50 mg via ORAL

## 2020-08-24 MED ORDER — ACETAMINOPHEN 325 MG PO TABS
ORAL_TABLET | ORAL | Status: AC
  Filled 2020-08-24: qty 2

## 2020-08-24 MED ORDER — METOPROLOL TARTRATE 25 MG PO TABS
25.0000 mg | ORAL_TABLET | Freq: Once | ORAL | Status: AC
  Administered 2020-08-24: 25 mg via ORAL
  Filled 2020-08-24: qty 1

## 2020-08-24 NOTE — Progress Notes (Signed)
Per Dr. Nicholas Lose patient to get metoprolol 25mg  po one time dose due to increased blood pressure. See flowsheets. BP rechecked before patient left infusion room.  Patient denies SOB, headaches, blurry vision, or chest pain related to elevated blood pressure. No complaints after taking metoprolol by mouth. Patient educated to make an appointment with her primary care MD and to get started on blood pressure medication due to several readings in the past being elevated.

## 2020-08-25 ENCOUNTER — Other Ambulatory Visit: Payer: Self-pay | Admitting: Hematology and Oncology

## 2020-09-21 ENCOUNTER — Ambulatory Visit
Admission: RE | Admit: 2020-09-21 | Discharge: 2020-09-21 | Disposition: A | Discharge status: Home / Self Care | Payer: BC Managed Care – PPO | Source: Ambulatory Visit | Attending: Hematology and Oncology | Admitting: Hematology and Oncology

## 2020-09-21 ENCOUNTER — Other Ambulatory Visit: Payer: Self-pay

## 2020-09-21 DIAGNOSIS — C50212 Malignant neoplasm of upper-inner quadrant of left female breast: Secondary | ICD-10-CM

## 2020-09-21 DIAGNOSIS — Z170 Estrogen receptor positive status [ER+]: Secondary | ICD-10-CM

## 2020-09-21 DIAGNOSIS — R922 Inconclusive mammogram: Secondary | ICD-10-CM | POA: Diagnosis not present

## 2020-09-21 DIAGNOSIS — Z853 Personal history of malignant neoplasm of breast: Secondary | ICD-10-CM | POA: Diagnosis not present

## 2020-09-21 HISTORY — DX: Personal history of antineoplastic chemotherapy: Z92.21

## 2020-09-21 HISTORY — DX: Personal history of irradiation: Z92.3

## 2020-11-30 ENCOUNTER — Telehealth: Payer: Self-pay | Admitting: Adult Health

## 2020-11-30 NOTE — Telephone Encounter (Signed)
Rescheduled per pt request. Pt called in to reschedule appt confirmed new appt date 7/8

## 2020-12-06 ENCOUNTER — Encounter: Payer: BC Managed Care – PPO | Admitting: Adult Health

## 2020-12-14 ENCOUNTER — Other Ambulatory Visit: Payer: Self-pay

## 2020-12-14 ENCOUNTER — Inpatient Hospital Stay: Payer: BC Managed Care – PPO | Attending: Adult Health | Admitting: Adult Health

## 2020-12-14 ENCOUNTER — Encounter: Payer: Self-pay | Admitting: Adult Health

## 2020-12-14 VITALS — BP 184/96 | HR 107 | Temp 97.6°F | Resp 16 | Ht 69.0 in | Wt 208.2 lb

## 2020-12-14 DIAGNOSIS — Z79899 Other long term (current) drug therapy: Secondary | ICD-10-CM | POA: Diagnosis not present

## 2020-12-14 DIAGNOSIS — Z170 Estrogen receptor positive status [ER+]: Secondary | ICD-10-CM | POA: Diagnosis not present

## 2020-12-14 DIAGNOSIS — Z79811 Long term (current) use of aromatase inhibitors: Secondary | ICD-10-CM | POA: Diagnosis not present

## 2020-12-14 DIAGNOSIS — C50212 Malignant neoplasm of upper-inner quadrant of left female breast: Secondary | ICD-10-CM | POA: Diagnosis not present

## 2020-12-14 DIAGNOSIS — R232 Flushing: Secondary | ICD-10-CM | POA: Diagnosis not present

## 2020-12-14 DIAGNOSIS — Z9221 Personal history of antineoplastic chemotherapy: Secondary | ICD-10-CM | POA: Insufficient documentation

## 2020-12-14 DIAGNOSIS — Z923 Personal history of irradiation: Secondary | ICD-10-CM | POA: Insufficient documentation

## 2020-12-14 NOTE — Progress Notes (Signed)
SURVIVORSHIP VISIT:  BRIEF ONCOLOGIC HISTORY:  Oncology History  Malignant neoplasm of upper-inner quadrant of left breast in female, estrogen receptor positive ([place])  04/28/2019 Initial Diagnosis   Palpable left breast lump.  Mammogram 04/21/19 showed a 4.5cm mass at the 11:30 position with smaller adjacent solid masses and one abnormal left axillary lymph node. 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   05/10/2019 Cancer Staging   Staging form: Breast, AJCC 8th Edition - Clinical stage from 05/10/2019: Stage IIB (cT2, cN1, cM0, G3, ER+, PR+, HER2-) - Signed by Nicholas Lose, MD on 05/10/2019    05/19/2019 - 07/30/2019 Neo-Adjuvant Chemotherapy   Neoadjuvant chemotherapy with Taxotere and Cytoxan x 4 cycles   08/30/2019 Surgery   Left lumpectomy [facility]): IDC with high grade DCIS, grade 3, 1.5cm, clear margins, 1/3 left axillary lymph nodes positive for carcinoma.    09/20/2019 - 08/24/2020 Adjuvant Chemotherapy   Herceptin every 3 weeks   10/06/2019 - 11/22/2019 Radiation Therapy   Adjuvant radiation Site Technique Total Dose (Gy) Dose per Fx (Gy) Completed Fx Beam Energies  Breast, Left: Breast_Lt_Bst 3D 10/10 2 5/5 6X  Axilla, Left: Axilla_Lt 3D 50.4/50.4 1.8 28/28 6X, 10X  Breast, Left: Breast_Lt 3D 50.4/50.4 1.8 28/28 15X    12/16/2019 -  Anti-estrogen oral therapy   Anastrozole, planned duration 7 years   08/29/2020 Cancer Staging   Staging form: Breast, AJCC 8th Edition - Pathologic stage from 08/29/2020: No Stage Recommended (ypT1c, pN1a, cM0, G3, ER+, PR+, HER2+) - Signed by Gardenia Phlegm, NP on 12/12/2020  Stage prefix: Post-therapy  Response to neoadjuvant therapy: Partial response  Histologic grading system: 3 grade system      INTERVAL HISTORY:  Elizabeth Sharp to review her survivorship care plan detailing her treatment course for breast cancer, as well as monitoring  long-term side effects of that treatment, education regarding health maintenance, screening, and overall wellness and health promotion.     Overall, Elizabeth Sharp reports feeling quite well.  She is taking Anastrozole daily and is tolerating it quite well.  She does note some hot flashes which are manageable for her.  She otherwise denies any difficulties.    REVIEW OF SYSTEMS:  Review of Systems  Constitutional:  Negative for appetite change, chills, fatigue, fever and unexpected weight change.  HENT:   Negative for hearing loss, lump/mass and trouble swallowing.   Eyes:  Negative for eye problems and icterus.  Respiratory:  Negative for chest tightness, cough and shortness of breath.   Cardiovascular:  Negative for chest pain, leg swelling and palpitations.  Gastrointestinal:  Negative for abdominal distention, abdominal pain, constipation, diarrhea, nausea and vomiting.  Endocrine: Positive for hot flashes.  Genitourinary:  Negative for difficulty urinating.   Musculoskeletal:  Negative for arthralgias.  Skin:  Negative for itching and rash.  Neurological:  Negative for dizziness, extremity weakness, headaches and numbness.  Hematological:  Negative for adenopathy. Does not bruise/bleed easily.  Psychiatric/Behavioral:  Negative for depression. The patient is not nervous/anxious.   Breast: Denies any new nodularity, masses, tenderness, nipple changes, or nipple discharge.      ONCOLOGY TREATMENT TEAM:  1. Surgeon:  Dr. Barry Dienes at [facility] 2. Medical Oncologist: Dr. Lindi Adie  3. Radiation Oncologist: Dr. Sondra Come    PAST MEDICAL/SURGICAL HISTORY:  Past Medical History:  Diagnosis Date   Cancer [facility])    L breast cancer   Personal history of  chemotherapy    Personal history of radiation therapy    Past Surgical History:  Procedure Laterality Date   BREAST BIOPSY Bilateral    BREAST LUMPECTOMY     BREAST LUMPECTOMY WITH RADIOACTIVE SEED AND SENTINEL LYMPH NODE BIOPSY  Left 08/30/2019   Procedure: LEFT BREAST LUMPECTOMY X 2 WITH RADIOACTIVE SEED AND SEED TARGETED LYMPH NODE BIOPSY, SENTINEL LYMPH NODE BIOPSY;  Surgeon: [name], MD;  Location: [place];  Service: General;  Laterality: Left;   PORTACATH PLACEMENT Left 05/17/2019   Procedure: INSERTION PORT-A-CATH WITH POSSIBLE ULTRASOUND;  Surgeon: [name], MD;  Location: [place];  Service: General;  Laterality: Left;   TUBAL LIGATION       ALLERGIES:  No Known Allergies   CURRENT MEDICATIONS:  Outpatient Encounter Medications as of 12/14/2020  Medication Sig   anastrozole (ARIMIDEX) 1 MG tablet Take 1 tablet (1 mg total) by mouth daily. (Patient not taking: Reported on 12/29/2019)   lidocaine-prilocaine (EMLA) cream Apply 1 application topically every 21 ( twenty-one) days.   venlafaxine XR (EFFEXOR-XR) 37.5 MG 24 hr capsule TAKE 1 CAPSULE BY MOUTH DAILY WITH BREAKFAST.   No facility-administered encounter medications on file as of 12/14/2020.     ONCOLOGIC FAMILY HISTORY:  Family History  Problem Relation Age of Onset   Breast cancer Neg Hx      GENETIC COUNSELING/TESTING:  Not at this time  SOCIAL HISTORY:  Social History   Socioeconomic History   Marital status: Single    Spouse name: Not on file   Number of children: Not on file   Years of education: Not on file   Highest education level: Not on file  Occupational History   Not on file  Tobacco Use   Smoking status: Never   Smokeless tobacco: Never  Vaping Use   Vaping Use: Never used  Substance and Sexual Activity   Alcohol use: Yes    Comment: 1 drink once weekly   Drug use: Never   Sexual activity: Not on file  Other Topics Concern   Not on file  Social History Narrative   Not on file   Social Determinants of Health   Financial Resource Strain: Not on file  Food Insecurity: Not on file  Transportation Needs: Not on file  Physical Activity: Not on file  Stress: Not on file  Social  Connections: Not on file  Intimate Partner Violence: Not on file     OBSERVATIONS/OBJECTIVE:  BP (!) 184/96 (BP Location: Right Arm, Patient Position: Sitting)   Pulse (!) 107   Temp 97.6 F (36.4 C) (Temporal)   Resp 16   Ht 5' 9" (1.753 m)   Wt 208 lb 3.2 oz (94.4 kg)   SpO2 99%   BMI 30.75 kg/m  GENERAL: Patient is a well appearing female in no acute distress HEENT:  Sclerae anicteric.  Oropharynx clear and moist. No ulcerations or evidence of oropharyngeal candidiasis. Neck is supple.  NODES:  No cervical, supraclavicular, or axillary lymphadenopathy palpated.  BREAST EXAM:  left breast s/p lumpectomy and radiation, no sign of local recurrence, right breast benign LUNGS:  Clear to auscultation bilaterally.  No wheezes or rhonchi. HEART:  Regular rate and rhythm. No murmur appreciated. ABDOMEN:  Soft, nontender.  Positive, normoactive bowel sounds. No organomegaly palpated. MSK:  No focal spinal tenderness to palpation. Full range of motion bilaterally in the upper extremities. EXTREMITIES:  No peripheral edema.   SKIN:  Clear with no obvious rashes or skin changes.  No nail dyscrasia. NEURO:  Nonfocal. Well oriented.  Appropriate affect.   LABORATORY DATA:  None for this visit.  DIAGNOSTIC IMAGING:  None for this visit.      ASSESSMENT AND PLAN:  Elizabeth Sharp is a pleasant 55 y.o. female with Stage IIB left breast invasive ductal carcinoma, ER+/PR+/HER2+, diagnosed in 05/2019, treated with neoadjuvant chemotherapy, lumpectomy, adjuvant radiation therapy, maintenance trastuzumab, and anti-estrogen therapy with Anastrozole beginning in 12/2020.  She presents to the Survivorship Clinic for our initial meeting and routine follow-up post-completion of treatment for breast cancer.    1. Stage IIB right/left breast cancer:  Elizabeth Sharp is continuing to recover from definitive treatment for breast cancer. She will follow-up with her medical oncologist, Dr. Lindi Adie in 6 months with  history and physical exam per surveillance protocol.  She will continue her anti-estrogen therapy with Anastrozole. Thus far, she is tolerating the Anastrozole well, with minimal side effects. She was instructed to make Dr. Lindi Adie or myself aware if she begins to experience any worsening side effects of the medication and I could see her back in clinic to help manage those side effects, as needed. Her mammogram is due 09/2021; orders placed today. Today, a comprehensive survivorship care plan and treatment summary was reviewed with the patient today detailing her breast cancer diagnosis, treatment course, potential late/long-term effects of treatment, appropriate follow-up care with recommendations for the future, and patient education resources.  A copy of this summary, along with a letter will be sent to the patient's primary care provider via mail/fax/In Basket message after today's visit.    2. Bone health:  Given Elizabeth Sharp's age/history of breast cancer and her current treatment regimen including anti-estrogen therapy with Anastrozole, she is at risk for bone demineralization.  She is scheduled for testing in August, 2022.  In the meantime, she was encouraged to increase her consumption of foods rich in calcium, as well as increase her weight-bearing activities.  She was given education on specific activities to promote bone health.  3. Cancer screening:  Due to Elizabeth Sharp's history and her age, she should receive screening for skin cancers, colon cancer, and gynecologic cancers.  The information and recommendations are listed on the patient's comprehensive care plan/treatment summary and were reviewed in detail with the patient.    4. Health maintenance and wellness promotion: Elizabeth Sharp was encouraged to consume 5-7 servings of fruits and vegetables per day. We reviewed the "Nutrition Rainbow" handout, as well as the handout "Take Control of Your Health and Reduce Your Cancer Risk" from the [place].  She was also encouraged to engage in moderate to vigorous exercise for 30 minutes per day most days of the week. We discussed the [employer] fitness program, which is designed for cancer survivors to help them become more physically fit after cancer treatments.  She was instructed to limit her alcohol consumption and continue to abstain from tobacco use.     5. Support services/counseling: It is not uncommon for this period of the patient's cancer care trajectory to be one of many emotions and stressors.  We discussed how this can be increasingly difficult during the times of quarantine and social distancing due to the COVID-19 pandemic.   She was given information regarding our available services and encouraged to contact me with any questions or for help enrolling in any of our support group/programs.    Follow up instructions:    -Return to cancer center in 6 months for f/u with  Dr. Lindi Adie  -Mammogram due in 09/2021 -Bone density 01/2021 -Follow up with surgery 1 year -She is welcome to return back to the Survivorship Clinic at any time; no additional follow-up needed at this time.  -Consider referral back to survivorship as a long-term survivor for continued surveillance  The patient was provided an opportunity to ask questions and all were answered. The patient agreed with the plan and demonstrated an understanding of the instructions.   Total encounter time: 30 minutes in SCP preparation, face to face visit time, order entry, and documentation of the encounter.    Wilber Bihari, NP 12/14/20 11:47 AM Medical Oncology and Hematology [facility] [place], [place] [address] Tel. [phone]    Fax. [phone]  *Total Encounter Time as defined by the Centers for Medicare and Medicaid Services includes, in addition to the face-to-face time of a patient visit (documented in the note above) non-face-to-face time: obtaining and reviewing  outside history, ordering and reviewing medications, tests or procedures, care coordination (communications with other health care professionals or caregivers) and documentation in the medical record.

## 2020-12-17 ENCOUNTER — Encounter: Payer: Self-pay | Admitting: Hematology and Oncology

## 2020-12-17 ENCOUNTER — Other Ambulatory Visit: Payer: Self-pay | Admitting: Hematology and Oncology

## 2020-12-17 ENCOUNTER — Telehealth: Payer: Self-pay | Admitting: Hematology and Oncology

## 2020-12-17 NOTE — Telephone Encounter (Signed)
Scheduled per 7/8 los. Called and spoke with pt confirmed 1/9 appt

## 2020-12-21 ENCOUNTER — Other Ambulatory Visit: Payer: Self-pay | Admitting: Hematology and Oncology

## 2021-01-17 ENCOUNTER — Other Ambulatory Visit: Payer: BC Managed Care – PPO

## 2021-03-27 ENCOUNTER — Other Ambulatory Visit: Payer: Self-pay | Admitting: Hematology and Oncology

## 2021-05-26 ENCOUNTER — Emergency Department (HOSPITAL_COMMUNITY)
Admission: EM | Admit: 2021-05-26 | Discharge: 2021-05-26 | Disposition: A | Discharge status: Home / Self Care | Payer: BC Managed Care – PPO | Attending: Emergency Medicine | Admitting: Emergency Medicine

## 2021-05-26 ENCOUNTER — Emergency Department (HOSPITAL_COMMUNITY): Payer: BC Managed Care – PPO

## 2021-05-26 ENCOUNTER — Encounter (HOSPITAL_COMMUNITY): Payer: Self-pay

## 2021-05-26 ENCOUNTER — Other Ambulatory Visit: Payer: Self-pay

## 2021-05-26 DIAGNOSIS — Z853 Personal history of malignant neoplasm of breast: Secondary | ICD-10-CM | POA: Insufficient documentation

## 2021-05-26 DIAGNOSIS — S99912D Unspecified injury of left ankle, subsequent encounter: Secondary | ICD-10-CM | POA: Diagnosis present

## 2021-05-26 DIAGNOSIS — S82842D Displaced bimalleolar fracture of left lower leg, subsequent encounter for closed fracture with routine healing: Secondary | ICD-10-CM | POA: Insufficient documentation

## 2021-05-26 DIAGNOSIS — Y9301 Activity, walking, marching and hiking: Secondary | ICD-10-CM | POA: Insufficient documentation

## 2021-05-26 DIAGNOSIS — S82892A Other fracture of left lower leg, initial encounter for closed fracture: Secondary | ICD-10-CM

## 2021-05-26 DIAGNOSIS — W108XXD Fall (on) (from) other stairs and steps, subsequent encounter: Secondary | ICD-10-CM | POA: Diagnosis not present

## 2021-05-26 NOTE — Discharge Instructions (Signed)
Follow up with Dr. Percell Miller for further treatment of left ankle fracture. Use your walker for safe ambulation and continue Naproxen and Tylenol for pain.

## 2021-05-26 NOTE — ED Provider Notes (Signed)
[facility] DEPT Provider Note   CSN: [id number] Arrival date & time: 05/26/21  2009     History Chief Complaint  Patient presents with   Ankle Injury    Elizabeth Sharp is a 55 y.o. female.  Patient to ED for treatment of known left ankle fracture. She was on a cruise for the last week and 3 days ago slipped while walking down a flight of steps landing on her left ankle. She received x-rays, underwent a closed reduction and splinting and was instructed to go to the emergency department when she returned home for orthopedic follow up. She reports pain is managed with Naproxen and Tylenol. She is using a walker for mobilization.  The history is provided by the patient. No language interpreter was used.  Ankle Injury Pertinent negatives include no chest pain and no shortness of breath.      Past Medical History:  Diagnosis Date   Cancer ([place])    L breast cancer   Personal history of chemotherapy    Personal history of radiation therapy     Patient Active Problem List   Diagnosis Date Noted   Port-A-Cath in place 07/08/2019   Malignant neoplasm of upper-inner quadrant of left breast in female, estrogen receptor positive ([place]) 05/10/2019   Varicose veins of both lower extremities with inflammation 02/18/2019   Localized swelling of left lower leg 02/11/2019    Past Surgical History:  Procedure Laterality Date   BREAST BIOPSY Bilateral    BREAST LUMPECTOMY     BREAST LUMPECTOMY WITH RADIOACTIVE SEED AND SENTINEL LYMPH NODE BIOPSY Left 08/30/2019   Procedure: LEFT BREAST LUMPECTOMY X 2 WITH RADIOACTIVE SEED AND SEED TARGETED LYMPH NODE BIOPSY, SENTINEL LYMPH NODE BIOPSY;  Surgeon: [name], MD;  Location: [place];  Service: General;  Laterality: Left;   PORTACATH PLACEMENT Left 05/17/2019   Procedure: INSERTION PORT-A-CATH WITH POSSIBLE ULTRASOUND;  Surgeon: [name], MD;  Location: [place];  Service: General;   Laterality: Left;   TUBAL LIGATION       OB History   No obstetric history on file.     Family History  Problem Relation Age of Onset   Breast cancer Neg Hx     Social History   Tobacco Use   Smoking status: Never   Smokeless tobacco: Never  Vaping Use   Vaping Use: Never used  Substance Use Topics   Alcohol use: Yes    Comment: 1 drink once weekly   Drug use: Never    Home Medications Prior to Admission medications   Medication Sig Start Date End Date Taking? Authorizing Provider  anastrozole (ARIMIDEX) 1 MG tablet TAKE 1 TABLET BY MOUTH EVERY DAY 03/27/21   Nicholas Lose, MD  lidocaine-prilocaine (EMLA) cream Apply 1 application topically every 21 ( twenty-one) days. 02/17/20   Nicholas Lose, MD  venlafaxine XR (EFFEXOR-XR) 37.5 MG 24 hr capsule TAKE 1 CAPSULE BY MOUTH DAILY WITH BREAKFAST. 12/17/20   Nicholas Lose, MD    Allergies    Patient has no known allergies.  Review of Systems   Review of Systems  Constitutional:  Negative for chills and fever.  Respiratory: Negative.  Negative for shortness of breath.   Cardiovascular: Negative.  Negative for chest pain.  Musculoskeletal:        See HPI.  Skin: Negative.  Negative for color change and wound.  Neurological: Negative.  Negative for numbness.   Physical Exam Updated Vital Signs BP (!) 159/100 (BP  Location: Right Arm)    Pulse 68    Temp 98.1 F (36.7 C) (Oral)    Resp 20    Ht 5\' 9"  (1.753 m)    Wt 95.3 kg    SpO2 98%    BMI 31.01 kg/m   Physical Exam Constitutional:      General: She is not in acute distress.    Appearance: She is well-developed. She is not ill-appearing.  Pulmonary:     Effort: Pulmonary effort is normal.  Musculoskeletal:        General: Normal range of motion.     Cervical back: Normal range of motion.     Comments: Left ankle in well placed posterior splint. Toes are not swollen, no discoloration. FROM. No calf tenderness.   Skin:    General: Skin is warm and dry.   Neurological:     Mental Status: She is alert and oriented to person, place, and time.     Sensory: No sensory deficit.    ED Results / Procedures / Treatments   Labs (all labs ordered are listed, but only abnormal results are displayed) Labs Reviewed - No data to display  EKG None  Radiology DG Ankle Complete Left  Result Date: 05/26/2021 CLINICAL DATA:  Known ankle fractures with splinting, evaluate displacement. EXAM: LEFT ANKLE COMPLETE - 3+ VIEW COMPARISON:  01/12/2019 FINDINGS: Oblique fracture is noted through the distal fibular metaphysis. Medial malleolar fracture is noted with lateral subluxation of the talus with respect to the distal tibia. Mild calcaneal spurring is noted. No other focal abnormality is seen. IMPRESSION: Bimalleolar fracture with lateral displacement of the talus with respect to the distal tibia. Electronically Signed   By: Inez Catalina M.D.   On: 05/26/2021 22:00    Procedures Procedures   Medications Ordered in ED Medications - No data to display  ED Course  I have reviewed the triage vital signs and the nursing notes.  Pertinent labs & imaging results that were available during my care of the patient were reviewed by me and considered in my medical decision making (see chart for details).    MDM Rules/Calculators/A&P                         Patient to ED with ankle injury x 4 days ago as detailed in the HPI.   Imaging c/w bimalleolar fracture. Neurovascularly intact. Will refer to orthopedics. She has a way to ambulate safely. Pain is controlled.      Final Clinical Impression(s) / ED Diagnoses Final diagnoses:  None   Bimalleolar fracture, left  Rx / DC Orders ED Discharge Orders     None        Dennie Bible 05/26/21 2306    Drenda Freeze, MD 05/27/21 [phone]

## 2021-05-26 NOTE — ED Triage Notes (Signed)
Pt states that she was on a cruise Thursday, fell down the stairs and broke her left ankle.

## 2021-05-26 NOTE — ED Provider Notes (Signed)
Emergency Medicine Provider Triage Evaluation Note  Elizabeth Sharp , a 55 y.o. female  was evaluated in triage.  Pt complains of lle pain. She was on a cruise last week and sustained an ankle fracture. She underwent a closed reduction then and had a splint placed. She was told to go to the ED when she arrived home.  Review of Systems  Positive: Lle fracture Negative: numbness  Physical Exam  BP (!) 182/106 (BP Location: Right Arm)    Pulse 86    Temp 98.1 F (36.7 C) (Oral)    Resp 18    Ht 5\' 9"  (1.753 m)    Wt 95.3 kg    SpO2 98%    BMI 31.01 kg/m  Gen:   Awake, no distress   Resp:  Normal effort  MSK:   Moves extremities without difficulty  Other:  Splint in place to the [place]  Medically screening exam initiated at 8:54 PM.  Appropriate orders placed.  Thurnell Lose was informed that the remainder of the evaluation will be completed by another provider, this initial triage assessment does not replace that evaluation, and the importance of remaining in the ED until their evaluation is complete.     Bishop Dublin 05/26/21 2054    Carmin Muskrat, MD 05/26/21 2246

## 2021-06-17 ENCOUNTER — Ambulatory Visit: Payer: BC Managed Care – PPO | Admitting: Hematology and Oncology

## 2021-07-09 ENCOUNTER — Ambulatory Visit
Admission: RE | Admit: 2021-07-09 | Discharge: 2021-07-09 | Disposition: A | Discharge status: Home / Self Care | Payer: BC Managed Care – PPO | Source: Ambulatory Visit | Attending: Hematology and Oncology | Admitting: Hematology and Oncology

## 2021-07-09 DIAGNOSIS — Z780 Asymptomatic menopausal state: Secondary | ICD-10-CM

## 2021-11-30 ENCOUNTER — Other Ambulatory Visit: Payer: Self-pay | Admitting: Nurse Practitioner

## 2021-12-23 ENCOUNTER — Inpatient Hospital Stay: Payer: BC Managed Care – PPO | Admitting: Hematology and Oncology

## 2021-12-23 NOTE — Assessment & Plan Note (Deleted)
04/28/2019: Palpable left breast lump, mammogram 4.5 cm mass at 11:30 position and adjacent smaller masses, 1 abnormal left axillary lymph node: Biopsy of breast and axilla both positive for grade 3 IDC ER 95%, PR 90%, Ki-67 70%, HER-2 negative T2N1 stage IIb clinical stage  Recommendation: 1.Neoadjuvant chemotherapywith Taxotere and Cytoxan every 3 weeks x4completed 07/30/2019,Adjuvant Herceptinstarted 09/20/2019 Herceptin completed March 2022 2.Breast conserving surgery with targeted node dissection3/23/2021: Grade 3 IDC 1.5 cm with high-grade DCIS, margins negative Left lateral lumpectomy: Grade 3 IDC microscopic focus margins negative, 1/3 lymph nodes positive Repeat prognostic panel: ER 50% moderate, PR 10% strong, Ki-67 2%, HER-2 equivocal by IHC FISH positive for HER-2 ratio 3.5 and copy #5.55. (Heterogeneity in HER-2 expression) 3.Adjuvant radiation therapy 10/07/2019-12/15/2019 4.Followed by adjuvant antiestrogen therapy Starting December 16, 2019 --------------------------------------------------------------------------------------------------------------------------------------------------------- Current treatment: Anastrozole started December 16, 2019  Elevated LFTs: Slowly coming down. Okay to treat.  Anastrozoletoxicities: Severe hot flashes: On Effexor. Bone density 07/09/2021: T score -0.9  Breast cancer surveillance: 1.  Breast exam 12/23/2021: Benign 2. Mammogram  Return to clinic in 1 year for follow-up

## 2022-10-15 ENCOUNTER — Other Ambulatory Visit: Payer: Self-pay | Admitting: Hematology and Oncology
